# Patient Record
Sex: Male | Born: 1945 | Hispanic: Yes | Marital: Married | Smoking: Former smoker
Health system: Southern US, Community
[De-identification: ages and names within clinical notes are randomized; demographics above are authoritative.]

## PROBLEM LIST (undated history)

## (undated) DIAGNOSIS — I10 Essential (primary) hypertension: Secondary | ICD-10-CM

## (undated) DIAGNOSIS — D649 Anemia, unspecified: Secondary | ICD-10-CM

## (undated) DIAGNOSIS — N189 Chronic kidney disease, unspecified: Secondary | ICD-10-CM

## (undated) DIAGNOSIS — I509 Heart failure, unspecified: Secondary | ICD-10-CM

## (undated) DIAGNOSIS — Z87442 Personal history of urinary calculi: Secondary | ICD-10-CM

## (undated) DIAGNOSIS — N209 Urinary calculus, unspecified: Secondary | ICD-10-CM

## (undated) DIAGNOSIS — C9110 Chronic lymphocytic leukemia of B-cell type not having achieved remission: Secondary | ICD-10-CM

## (undated) DIAGNOSIS — R0600 Dyspnea, unspecified: Secondary | ICD-10-CM

## (undated) HISTORY — PX: UMBILICAL HERNIA REPAIR: SHX196

## (undated) HISTORY — PX: KNEE ARTHROSCOPY: SHX127

## (undated) HISTORY — DX: Urinary calculus, unspecified: N20.9

## (undated) HISTORY — PX: INGUINAL HERNIA REPAIR: SUR1180

---

## 2003-11-06 ENCOUNTER — Encounter: Admission: RE | Admit: 2003-11-06 | Discharge: 2003-11-06 | Payer: Self-pay | Admitting: Sports Medicine

## 2003-12-04 ENCOUNTER — Ambulatory Visit (HOSPITAL_COMMUNITY): Admission: RE | Admit: 2003-12-04 | Discharge: 2003-12-04 | Payer: Self-pay | Admitting: Family Medicine

## 2003-12-04 ENCOUNTER — Encounter: Admission: RE | Admit: 2003-12-04 | Discharge: 2003-12-04 | Payer: Self-pay | Admitting: Family Medicine

## 2003-12-21 ENCOUNTER — Ambulatory Visit (HOSPITAL_COMMUNITY): Admission: RE | Admit: 2003-12-21 | Discharge: 2003-12-21 | Payer: Self-pay | Admitting: Family Medicine

## 2004-01-01 ENCOUNTER — Encounter: Admission: RE | Admit: 2004-01-01 | Discharge: 2004-01-01 | Payer: Self-pay | Admitting: Sports Medicine

## 2004-01-30 ENCOUNTER — Ambulatory Visit (HOSPITAL_COMMUNITY): Admission: RE | Admit: 2004-01-30 | Discharge: 2004-01-31 | Payer: Self-pay | Admitting: *Deleted

## 2004-02-05 HISTORY — PX: CORONARY ANGIOPLASTY WITH STENT PLACEMENT: SHX49

## 2004-03-04 ENCOUNTER — Encounter: Admission: RE | Admit: 2004-03-04 | Discharge: 2004-03-04 | Payer: Self-pay | Admitting: Sports Medicine

## 2004-03-25 ENCOUNTER — Encounter: Admission: RE | Admit: 2004-03-25 | Discharge: 2004-03-25 | Payer: Self-pay | Admitting: Sports Medicine

## 2004-04-26 ENCOUNTER — Ambulatory Visit: Payer: Self-pay | Admitting: Family Medicine

## 2004-05-20 ENCOUNTER — Ambulatory Visit: Payer: Self-pay | Admitting: Family Medicine

## 2004-06-03 ENCOUNTER — Ambulatory Visit: Payer: Self-pay | Admitting: Family Medicine

## 2004-07-08 ENCOUNTER — Ambulatory Visit: Payer: Self-pay | Admitting: Family Medicine

## 2004-08-31 ENCOUNTER — Emergency Department (HOSPITAL_COMMUNITY): Admission: EM | Admit: 2004-08-31 | Discharge: 2004-09-01 | Payer: Self-pay | Admitting: Emergency Medicine

## 2004-09-16 ENCOUNTER — Ambulatory Visit: Payer: Self-pay | Admitting: Family Medicine

## 2004-10-28 ENCOUNTER — Ambulatory Visit: Payer: Self-pay | Admitting: Family Medicine

## 2004-11-18 ENCOUNTER — Ambulatory Visit: Payer: Self-pay | Admitting: Family Medicine

## 2004-11-25 ENCOUNTER — Ambulatory Visit: Payer: Self-pay | Admitting: Family Medicine

## 2004-12-16 ENCOUNTER — Ambulatory Visit: Payer: Self-pay | Admitting: Family Medicine

## 2005-03-03 ENCOUNTER — Ambulatory Visit: Payer: Self-pay | Admitting: Family Medicine

## 2005-03-10 ENCOUNTER — Ambulatory Visit: Payer: Self-pay | Admitting: Family Medicine

## 2005-05-05 ENCOUNTER — Ambulatory Visit: Payer: Self-pay | Admitting: Family Medicine

## 2005-07-07 ENCOUNTER — Ambulatory Visit: Payer: Self-pay | Admitting: Family Medicine

## 2005-07-10 ENCOUNTER — Ambulatory Visit: Payer: Self-pay | Admitting: Cardiology

## 2005-07-15 ENCOUNTER — Ambulatory Visit: Payer: Self-pay

## 2005-10-27 ENCOUNTER — Ambulatory Visit: Payer: Self-pay | Admitting: Family Medicine

## 2005-11-03 ENCOUNTER — Ambulatory Visit: Payer: Self-pay | Admitting: Family Medicine

## 2005-11-18 ENCOUNTER — Ambulatory Visit: Payer: Self-pay | Admitting: Cardiology

## 2005-12-01 ENCOUNTER — Ambulatory Visit: Payer: Self-pay | Admitting: Family Medicine

## 2005-12-18 ENCOUNTER — Ambulatory Visit: Payer: Self-pay | Admitting: Internal Medicine

## 2005-12-29 ENCOUNTER — Emergency Department (HOSPITAL_COMMUNITY): Admission: EM | Admit: 2005-12-29 | Discharge: 2005-12-29 | Payer: Self-pay | Admitting: Family Medicine

## 2006-01-05 ENCOUNTER — Ambulatory Visit: Payer: Self-pay | Admitting: Family Medicine

## 2006-03-09 ENCOUNTER — Ambulatory Visit: Payer: Self-pay | Admitting: Family Medicine

## 2006-03-23 ENCOUNTER — Ambulatory Visit: Payer: Self-pay | Admitting: Family Medicine

## 2006-04-06 ENCOUNTER — Ambulatory Visit: Payer: Self-pay | Admitting: Family Medicine

## 2006-05-04 ENCOUNTER — Ambulatory Visit: Payer: Self-pay | Admitting: Family Medicine

## 2006-06-03 ENCOUNTER — Ambulatory Visit: Payer: Self-pay | Admitting: Cardiology

## 2006-06-08 ENCOUNTER — Ambulatory Visit: Payer: Self-pay | Admitting: Family Medicine

## 2006-07-13 ENCOUNTER — Ambulatory Visit: Payer: Self-pay | Admitting: Family Medicine

## 2006-08-05 ENCOUNTER — Emergency Department (HOSPITAL_COMMUNITY): Admission: EM | Admit: 2006-08-05 | Discharge: 2006-08-05 | Payer: Self-pay | Admitting: Emergency Medicine

## 2006-08-10 ENCOUNTER — Ambulatory Visit: Payer: Self-pay | Admitting: Family Medicine

## 2006-08-31 ENCOUNTER — Ambulatory Visit: Payer: Self-pay | Admitting: Cardiology

## 2006-09-08 ENCOUNTER — Ambulatory Visit: Payer: Self-pay | Admitting: Cardiology

## 2006-10-22 DIAGNOSIS — IMO0002 Reserved for concepts with insufficient information to code with codable children: Secondary | ICD-10-CM

## 2006-10-22 DIAGNOSIS — I2510 Atherosclerotic heart disease of native coronary artery without angina pectoris: Secondary | ICD-10-CM | POA: Insufficient documentation

## 2006-10-22 DIAGNOSIS — E785 Hyperlipidemia, unspecified: Secondary | ICD-10-CM

## 2006-10-22 DIAGNOSIS — I10 Essential (primary) hypertension: Secondary | ICD-10-CM

## 2006-10-22 DIAGNOSIS — M1990 Unspecified osteoarthritis, unspecified site: Secondary | ICD-10-CM

## 2006-10-22 DIAGNOSIS — F209 Schizophrenia, unspecified: Secondary | ICD-10-CM | POA: Insufficient documentation

## 2006-10-22 DIAGNOSIS — H9190 Unspecified hearing loss, unspecified ear: Secondary | ICD-10-CM | POA: Insufficient documentation

## 2006-10-22 DIAGNOSIS — F339 Major depressive disorder, recurrent, unspecified: Secondary | ICD-10-CM | POA: Insufficient documentation

## 2006-11-16 ENCOUNTER — Encounter: Payer: Self-pay | Admitting: Family Medicine

## 2006-11-16 ENCOUNTER — Ambulatory Visit: Payer: Self-pay | Admitting: Family Medicine

## 2006-11-16 DIAGNOSIS — N529 Male erectile dysfunction, unspecified: Secondary | ICD-10-CM

## 2006-12-14 ENCOUNTER — Ambulatory Visit: Payer: Self-pay | Admitting: Family Medicine

## 2006-12-18 ENCOUNTER — Encounter: Payer: Self-pay | Admitting: Family Medicine

## 2007-01-20 ENCOUNTER — Encounter: Payer: Self-pay | Admitting: Family Medicine

## 2007-01-20 ENCOUNTER — Ambulatory Visit: Payer: Self-pay | Admitting: Sports Medicine

## 2007-01-20 LAB — CONVERTED CEMR LAB
AST: 40 units/L — ABNORMAL HIGH (ref 0–37)
Albumin: 4.7 g/dL (ref 3.5–5.2)
Alkaline Phosphatase: 99 units/L (ref 39–117)
BUN: 19 mg/dL (ref 6–23)
CO2: 26 meq/L (ref 19–32)
Chloride: 104 meq/L (ref 96–112)
Glucose, Bld: 107 mg/dL — ABNORMAL HIGH (ref 70–99)
HDL: 29 mg/dL — ABNORMAL LOW (ref >39)
LDL Cholesterol: 82 mg/dL (ref 0–99)
PSA: 0.37 ng/mL (ref 0.10–4.00)
Potassium: 3.8 meq/L (ref 3.5–5.3)
Total Bilirubin: 0.7 mg/dL (ref 0.3–1.2)
Total Protein: 7 g/dL (ref 6.0–8.3)
VLDL: 69 mg/dL — ABNORMAL HIGH (ref 0–40)

## 2007-01-25 ENCOUNTER — Ambulatory Visit: Payer: Self-pay | Admitting: Family Medicine

## 2007-01-25 LAB — CONVERTED CEMR LAB: Total CK: 96 units/L (ref 7–232)

## 2007-02-02 ENCOUNTER — Telehealth: Payer: Self-pay | Admitting: Family Medicine

## 2007-03-03 ENCOUNTER — Ambulatory Visit: Payer: Self-pay | Admitting: Cardiology

## 2007-03-04 ENCOUNTER — Ambulatory Visit: Payer: Self-pay | Admitting: Cardiology

## 2007-03-04 LAB — CONVERTED CEMR LAB
ALT: 26 units/L (ref 0–53)
Albumin: 3.8 g/dL (ref 3.5–5.2)
Alkaline Phosphatase: 97 units/L (ref 39–117)
Total CHOL/HDL Ratio: 4.8
Total Protein: 6.4 g/dL (ref 6.0–8.3)

## 2007-03-22 ENCOUNTER — Encounter: Payer: Self-pay | Admitting: Family Medicine

## 2007-03-22 ENCOUNTER — Ambulatory Visit: Payer: Self-pay | Admitting: Family Medicine

## 2007-03-24 LAB — CONVERTED CEMR LAB
MCV: 87.9 fL (ref 78.0–100.0)
Platelets: 177 10*3/uL (ref 150–400)
Prothrombin Time: 13.6 s (ref 11.6–15.2)
WBC: 14.2 10*3/uL — ABNORMAL HIGH (ref 4.0–10.5)

## 2007-05-17 ENCOUNTER — Ambulatory Visit: Payer: Self-pay | Admitting: Family Medicine

## 2007-07-05 ENCOUNTER — Ambulatory Visit: Payer: Self-pay | Admitting: Family Medicine

## 2007-07-16 ENCOUNTER — Ambulatory Visit: Payer: Self-pay | Admitting: Family Medicine

## 2007-07-19 ENCOUNTER — Ambulatory Visit: Payer: Self-pay | Admitting: Family Medicine

## 2007-07-19 LAB — CONVERTED CEMR LAB
Blood Glucose, Fingerstick: 109
Hemoglobin: 15.2 g/dL (ref 13.0–17.0)
Platelets: 180 10*3/uL (ref 150–400)
Potassium: 4.4 meq/L (ref 3.5–5.3)
RBC: 5.31 M/uL (ref 4.22–5.81)
Sodium: 144 meq/L (ref 135–145)
TSH: 1.326 microintl units/mL (ref 0.350–5.50)
WBC: 17.8 10*3/uL — ABNORMAL HIGH (ref 4.0–10.5)

## 2007-07-20 ENCOUNTER — Encounter: Payer: Self-pay | Admitting: Family Medicine

## 2007-07-21 LAB — CONVERTED CEMR LAB
Lymphocytes Relative: 73 % — ABNORMAL HIGH (ref 12–46)
Lymphs Abs: 16 10*3/uL — ABNORMAL HIGH (ref 0.7–4.0)
MCV: 87.2 fL (ref 78.0–100.0)
Monocytes Relative: 3 % (ref 3–12)
Neutro Abs: 4 10*3/uL (ref 1.7–7.7)
Neutrophils Relative %: 18 % — ABNORMAL LOW (ref 43–77)
RBC: 5.53 M/uL (ref 4.22–5.81)
WBC: 21.8 10*3/uL — ABNORMAL HIGH (ref 4.0–10.5)

## 2007-07-26 ENCOUNTER — Ambulatory Visit: Payer: Self-pay | Admitting: Family Medicine

## 2007-07-26 DIAGNOSIS — C9110 Chronic lymphocytic leukemia of B-cell type not having achieved remission: Secondary | ICD-10-CM

## 2007-07-28 LAB — CONVERTED CEMR LAB
Basophils Absolute: 0 10*3/uL (ref 0.0–0.1)
Basophils Relative: 0 % (ref 0–1)
Lymphocytes Relative: 71 % — ABNORMAL HIGH (ref 12–46)
MCHC: 33.9 g/dL (ref 30.0–36.0)
Neutro Abs: 3.2 10*3/uL (ref 1.7–7.7)
Neutrophils Relative %: 21 % — ABNORMAL LOW (ref 43–77)
Platelets: 193 10*3/uL (ref 150–400)
RBC: 5.36 M/uL (ref 4.22–5.81)
RDW: 13.8 % (ref 11.5–15.5)

## 2007-09-09 ENCOUNTER — Telehealth: Payer: Self-pay | Admitting: Family Medicine

## 2007-10-11 ENCOUNTER — Ambulatory Visit: Payer: Self-pay | Admitting: Cardiology

## 2007-10-11 ENCOUNTER — Ambulatory Visit: Payer: Self-pay | Admitting: Family Medicine

## 2007-10-11 LAB — CONVERTED CEMR LAB
ALT: 20 units/L (ref 0–53)
BUN: 17 mg/dL (ref 6–23)
CO2: 25 meq/L (ref 19–32)
Calcium: 10 mg/dL (ref 8.4–10.5)
Chloride: 106 meq/L (ref 96–112)
Creatinine, Ser: 1.2 mg/dL (ref 0.40–1.50)
Eosinophils Absolute: 1 10*3/uL — ABNORMAL HIGH (ref 0.0–0.7)
Eosinophils Relative: 5 % (ref 0–5)
Glucose, Bld: 128 mg/dL — ABNORMAL HIGH (ref 70–99)
HCT: 47.9 % (ref 39.0–52.0)
Hemoglobin: 16.5 g/dL (ref 13.0–17.0)
MCHC: 34.4 g/dL (ref 30.0–36.0)
MCV: 84.3 fL (ref 78.0–100.0)
Monocytes Absolute: 0.8 10*3/uL (ref 0.1–1.0)
Monocytes Relative: 4 % (ref 3–12)
Neutrophils Relative %: 17 % — ABNORMAL LOW (ref 43–77)
RBC: 5.68 M/uL (ref 4.22–5.81)

## 2007-10-12 ENCOUNTER — Encounter: Payer: Self-pay | Admitting: Family Medicine

## 2007-10-13 ENCOUNTER — Ambulatory Visit: Payer: Self-pay | Admitting: Hematology & Oncology

## 2007-10-28 ENCOUNTER — Ambulatory Visit: Payer: Self-pay | Admitting: Hematology & Oncology

## 2007-10-28 ENCOUNTER — Telehealth: Payer: Self-pay | Admitting: *Deleted

## 2007-11-08 ENCOUNTER — Encounter (INDEPENDENT_AMBULATORY_CARE_PROVIDER_SITE_OTHER): Payer: Self-pay | Admitting: *Deleted

## 2007-12-03 ENCOUNTER — Encounter: Payer: Self-pay | Admitting: Hematology & Oncology

## 2007-12-03 ENCOUNTER — Encounter: Payer: Self-pay | Admitting: Family Medicine

## 2007-12-03 ENCOUNTER — Other Ambulatory Visit: Admission: RE | Admit: 2007-12-03 | Discharge: 2007-12-03 | Payer: Self-pay | Admitting: Hematology & Oncology

## 2007-12-03 LAB — CBC WITH DIFFERENTIAL/PLATELET
BASO%: 0.7 % (ref 0.0–2.0)
Basophils Absolute: 0.2 10*3/uL — ABNORMAL HIGH (ref 0.0–0.1)
EOS%: 3.3 % (ref 0.0–7.0)
MCH: 29.5 pg (ref 28.0–33.4)
MCHC: 35.6 g/dL (ref 32.0–35.9)
MCV: 83.1 fL (ref 81.6–98.0)
MONO%: 3.3 % (ref 0.0–13.0)
RBC: 5.58 10*6/uL (ref 4.20–5.71)
RDW: 13.1 % (ref 11.2–14.6)
lymph#: 15.4 10*3/uL — ABNORMAL HIGH (ref 0.9–3.3)

## 2007-12-03 LAB — CHCC SMEAR

## 2007-12-03 LAB — TECHNOLOGIST REVIEW

## 2007-12-07 LAB — SPEP & IFE WITH QIG
Albumin ELP: 63 % (ref 55.8–66.1)
Alpha-1-Globulin: 4 % (ref 2.9–4.9)
Alpha-2-Globulin: 7.4 % (ref 7.1–11.8)
Total Protein, Serum Electrophoresis: 7.7 g/dL (ref 6.0–8.3)

## 2007-12-23 LAB — FLOW CYTOMETRY

## 2008-01-03 ENCOUNTER — Ambulatory Visit: Payer: Self-pay | Admitting: Family Medicine

## 2008-01-31 ENCOUNTER — Encounter: Payer: Self-pay | Admitting: Family Medicine

## 2008-01-31 ENCOUNTER — Ambulatory Visit: Payer: Self-pay | Admitting: Family Medicine

## 2008-02-02 LAB — CONVERTED CEMR LAB: HDL: 29 mg/dL — ABNORMAL LOW (ref >39)

## 2008-02-07 ENCOUNTER — Ambulatory Visit: Payer: Self-pay | Admitting: Hematology & Oncology

## 2008-02-07 ENCOUNTER — Ambulatory Visit: Payer: Self-pay | Admitting: Family Medicine

## 2008-04-17 ENCOUNTER — Ambulatory Visit: Payer: Self-pay | Admitting: Family Medicine

## 2008-04-17 LAB — CONVERTED CEMR LAB
Basophils Relative: 0 % (ref 0–1)
Eosinophils Relative: 5 % (ref 0–5)
HCT: 49.3 % (ref 39.0–52.0)
Hemoglobin: 16.1 g/dL (ref 13.0–17.0)
Lymphocytes Relative: 75 % — ABNORMAL HIGH (ref 12–46)
Lymphs Abs: 15.9 10*3/uL — ABNORMAL HIGH (ref 0.7–4.0)
Monocytes Absolute: 0.4 10*3/uL (ref 0.1–1.0)
Neutro Abs: 3.8 10*3/uL (ref 1.7–7.7)
Platelets: 200 10*3/uL (ref 150–400)
WBC: 21.2 10*3/uL — ABNORMAL HIGH (ref 4.0–10.5)

## 2008-04-18 ENCOUNTER — Encounter: Payer: Self-pay | Admitting: Family Medicine

## 2008-04-20 ENCOUNTER — Encounter: Payer: Self-pay | Admitting: Family Medicine

## 2008-05-22 ENCOUNTER — Ambulatory Visit: Payer: Self-pay | Admitting: Family Medicine

## 2008-05-22 LAB — CONVERTED CEMR LAB

## 2008-05-30 ENCOUNTER — Encounter: Payer: Self-pay | Admitting: Family Medicine

## 2008-05-30 LAB — CONVERTED CEMR LAB
OCCULT 2: NEGATIVE
OCCULT 3: NEGATIVE

## 2008-05-31 ENCOUNTER — Encounter: Payer: Self-pay | Admitting: Family Medicine

## 2008-06-19 ENCOUNTER — Ambulatory Visit: Payer: Self-pay | Admitting: Family Medicine

## 2008-06-19 DIAGNOSIS — K089 Disorder of teeth and supporting structures, unspecified: Secondary | ICD-10-CM | POA: Insufficient documentation

## 2008-06-19 LAB — CONVERTED CEMR LAB
Bilirubin Urine: NEGATIVE
Ketones, urine, test strip: NEGATIVE
Specific Gravity, Urine: 1.015
Triglycerides: 208 mg/dL — ABNORMAL HIGH (ref <150)
pH: 7.5

## 2008-07-10 ENCOUNTER — Ambulatory Visit: Payer: Self-pay | Admitting: Family Medicine

## 2008-07-10 LAB — CONVERTED CEMR LAB
ALT: 16 units/L (ref 0–53)
AST: 15 units/L (ref 0–37)
Albumin: 4.5 g/dL (ref 3.5–5.2)
Alkaline Phosphatase: 110 units/L (ref 39–117)
BUN: 18 mg/dL (ref 6–23)
CO2: 22 meq/L (ref 19–32)
Calcium: 10.4 mg/dL (ref 8.4–10.5)
Chloride: 105 meq/L (ref 96–112)
Creatinine, Ser: 1.05 mg/dL (ref 0.40–1.50)
Glucose, Bld: 108 mg/dL — ABNORMAL HIGH (ref 70–99)
HCT: 47.6 % (ref 39.0–52.0)
Hemoglobin: 15.8 g/dL (ref 13.0–17.0)
MCHC: 33.2 g/dL (ref 30.0–36.0)
MCV: 85.9 fL (ref 78.0–100.0)
Platelets: 206 10*3/uL (ref 150–400)
Potassium: 4.3 meq/L (ref 3.5–5.3)
RBC: 5.54 M/uL (ref 4.22–5.81)
RDW: 14.1 % (ref 11.5–15.5)
Sodium: 141 meq/L (ref 135–145)
Total Bilirubin: 0.6 mg/dL (ref 0.3–1.2)
Total Protein: 6.6 g/dL (ref 6.0–8.3)
WBC: 18.8 10*3/uL — ABNORMAL HIGH (ref 4.0–10.5)

## 2008-08-07 ENCOUNTER — Encounter: Payer: Self-pay | Admitting: Family Medicine

## 2008-08-07 ENCOUNTER — Ambulatory Visit: Payer: Self-pay | Admitting: Family Medicine

## 2008-09-11 ENCOUNTER — Ambulatory Visit: Payer: Self-pay | Admitting: Family Medicine

## 2008-09-11 DIAGNOSIS — F1111 Opioid abuse, in remission: Secondary | ICD-10-CM | POA: Insufficient documentation

## 2008-10-09 ENCOUNTER — Ambulatory Visit: Payer: Self-pay | Admitting: Family Medicine

## 2008-10-09 LAB — CONVERTED CEMR LAB
Basophils Absolute: 0.1 10*3/uL (ref 0.0–0.1)
Calcium: 11.1 mg/dL — ABNORMAL HIGH (ref 8.4–10.5)
Creatinine, Ser: 1.39 mg/dL (ref 0.40–1.50)
Eosinophils Absolute: 1.1 10*3/uL — ABNORMAL HIGH (ref 0.0–0.7)
Eosinophils Relative: 4 % (ref 0–5)
MCHC: 35.5 g/dL (ref 30.0–36.0)
Neutro Abs: 5.9 10*3/uL (ref 1.7–7.7)
Neutrophils Relative %: 22 % — ABNORMAL LOW (ref 43–77)
RDW: 13.5 % (ref 11.5–15.5)

## 2008-10-10 ENCOUNTER — Encounter: Payer: Self-pay | Admitting: Family Medicine

## 2008-10-11 ENCOUNTER — Encounter: Payer: Self-pay | Admitting: Family Medicine

## 2009-01-01 ENCOUNTER — Encounter: Payer: Self-pay | Admitting: Family Medicine

## 2009-01-01 ENCOUNTER — Ambulatory Visit: Payer: Self-pay | Admitting: Family Medicine

## 2009-01-01 LAB — CONVERTED CEMR LAB
ALT: 20 units/L (ref 0–53)
Albumin: 4.3 g/dL (ref 3.5–5.2)
Alkaline Phosphatase: 126 units/L — ABNORMAL HIGH (ref 39–117)
CO2: 24 meq/L (ref 19–32)
Glucose, Bld: 72 mg/dL (ref 70–99)
HCT: 43.8 % (ref 39.0–52.0)
Lymphocytes Relative: 75 % — ABNORMAL HIGH (ref 12–46)
Lymphs Abs: 14.9 10*3/uL — ABNORMAL HIGH (ref 0.7–4.0)
MCV: 83.7 fL (ref 78.0–100.0)
Monocytes Absolute: 0.6 10*3/uL (ref 0.1–1.0)
Monocytes Relative: 3 % (ref 3–12)
Neutro Abs: 3.1 10*3/uL (ref 1.7–7.7)
Potassium: 3.9 meq/L (ref 3.5–5.3)
Sodium: 142 meq/L (ref 135–145)
Total Bilirubin: 0.6 mg/dL (ref 0.3–1.2)
Total Protein: 6.7 g/dL (ref 6.0–8.3)

## 2009-01-08 ENCOUNTER — Encounter: Payer: Self-pay | Admitting: Family Medicine

## 2009-01-15 ENCOUNTER — Ambulatory Visit: Payer: Self-pay | Admitting: Family Medicine

## 2009-01-17 ENCOUNTER — Telehealth: Payer: Self-pay | Admitting: Family Medicine

## 2009-03-26 ENCOUNTER — Ambulatory Visit: Payer: Self-pay | Admitting: Family Medicine

## 2009-03-27 ENCOUNTER — Encounter: Payer: Self-pay | Admitting: Family Medicine

## 2009-03-27 ENCOUNTER — Ambulatory Visit: Payer: Self-pay | Admitting: Family Medicine

## 2009-03-27 LAB — CONVERTED CEMR LAB
ALT: 10 units/L (ref 0–53)
Alkaline Phosphatase: 89 units/L (ref 39–117)
Basophils Relative: 0 % (ref 0–1)
CO2: 23 meq/L (ref 19–32)
Chloride: 106 meq/L (ref 96–112)
Creatinine, Ser: 1.4 mg/dL (ref 0.40–1.50)
Glucose, Bld: 99 mg/dL (ref 70–99)
Hemoglobin: 15.2 g/dL (ref 13.0–17.0)
Lymphs Abs: 18.9 10*3/uL — ABNORMAL HIGH (ref 0.7–4.0)
MCHC: 33 g/dL (ref 30.0–36.0)
Monocytes Absolute: 0.3 10*3/uL (ref 0.1–1.0)
Monocytes Relative: 1 % — ABNORMAL LOW (ref 3–12)
Neutro Abs: 3.3 10*3/uL (ref 1.7–7.7)
Potassium: 3.6 meq/L (ref 3.5–5.3)
RBC: 5.36 M/uL (ref 4.22–5.81)
Sodium: 140 meq/L (ref 135–145)
Total Bilirubin: 0.5 mg/dL (ref 0.3–1.2)
Total CHOL/HDL Ratio: 12.3
Total Protein: 6.3 g/dL (ref 6.0–8.3)

## 2009-03-28 ENCOUNTER — Encounter: Payer: Self-pay | Admitting: Family Medicine

## 2009-04-05 ENCOUNTER — Encounter: Payer: Self-pay | Admitting: Family Medicine

## 2009-04-05 ENCOUNTER — Telehealth: Payer: Self-pay | Admitting: Family Medicine

## 2009-04-19 ENCOUNTER — Telehealth: Payer: Self-pay | Admitting: Cardiovascular Disease

## 2009-05-14 ENCOUNTER — Ambulatory Visit: Payer: Self-pay | Admitting: Family Medicine

## 2009-05-17 ENCOUNTER — Encounter: Payer: Self-pay | Admitting: Family Medicine

## 2009-05-17 ENCOUNTER — Ambulatory Visit: Payer: Self-pay | Admitting: Family Medicine

## 2009-05-17 LAB — CONVERTED CEMR LAB
HDL: 28 mg/dL — ABNORMAL LOW (ref >39)
Triglycerides: 562 mg/dL — ABNORMAL HIGH (ref <150)

## 2009-05-18 ENCOUNTER — Telehealth (INDEPENDENT_AMBULATORY_CARE_PROVIDER_SITE_OTHER): Payer: Self-pay | Admitting: *Deleted

## 2009-06-12 ENCOUNTER — Ambulatory Visit: Payer: Self-pay | Admitting: Cardiovascular Disease

## 2009-06-14 ENCOUNTER — Telehealth (INDEPENDENT_AMBULATORY_CARE_PROVIDER_SITE_OTHER): Payer: Self-pay | Admitting: *Deleted

## 2009-06-18 ENCOUNTER — Ambulatory Visit: Payer: Self-pay

## 2009-06-18 ENCOUNTER — Encounter (HOSPITAL_COMMUNITY): Admission: RE | Admit: 2009-06-18 | Discharge: 2009-08-22 | Payer: Self-pay | Admitting: Cardiovascular Disease

## 2009-06-18 ENCOUNTER — Ambulatory Visit: Payer: Self-pay | Admitting: Cardiology

## 2009-09-05 ENCOUNTER — Ambulatory Visit: Payer: Self-pay | Admitting: Family Medicine

## 2009-09-05 ENCOUNTER — Encounter: Payer: Self-pay | Admitting: Family Medicine

## 2009-09-05 LAB — CONVERTED CEMR LAB
BUN: 16 mg/dL (ref 6–23)
CO2: 27 meq/L (ref 19–32)
Calcium: 10.9 mg/dL — ABNORMAL HIGH (ref 8.4–10.5)
Chloride: 104 meq/L (ref 96–112)
Creatinine, Ser: 1.29 mg/dL (ref 0.40–1.50)
Eosinophils Absolute: 1.4 10*3/uL — ABNORMAL HIGH (ref 0.0–0.7)
Eosinophils Relative: 6 % — ABNORMAL HIGH (ref 0–5)
Glucose, Bld: 93 mg/dL (ref 70–99)
HCT: 45.4 % (ref 39.0–52.0)
Hemoglobin: 16.2 g/dL (ref 13.0–17.0)
Lymphs Abs: 18 10*3/uL — ABNORMAL HIGH (ref 0.7–4.0)
MCV: 83.9 fL (ref 78.0–100.0)
Monocytes Absolute: 0.5 10*3/uL (ref 0.1–1.0)
Monocytes Relative: 2 % — ABNORMAL LOW (ref 3–12)
RDW: 13.7 % (ref 11.5–15.5)
Triglycerides: 437 mg/dL — ABNORMAL HIGH (ref <150)

## 2009-09-26 ENCOUNTER — Ambulatory Visit: Payer: Self-pay | Admitting: Family Medicine

## 2009-10-23 ENCOUNTER — Ambulatory Visit: Payer: Self-pay | Admitting: Family Medicine

## 2009-10-23 ENCOUNTER — Encounter: Payer: Self-pay | Admitting: Family Medicine

## 2009-10-23 LAB — CONVERTED CEMR LAB
ALT: 29 units/L (ref 0–53)
AST: 68 units/L — ABNORMAL HIGH (ref 0–37)
Albumin: 4.3 g/dL (ref 3.5–5.2)
Alkaline Phosphatase: 94 units/L (ref 39–117)
BUN: 20 mg/dL (ref 6–23)
Creatinine, Ser: 1.67 mg/dL — ABNORMAL HIGH (ref 0.40–1.50)
Potassium: 3.6 meq/L (ref 3.5–5.3)

## 2009-11-01 ENCOUNTER — Telehealth: Payer: Self-pay | Admitting: Family Medicine

## 2009-11-01 ENCOUNTER — Encounter: Payer: Self-pay | Admitting: Family Medicine

## 2010-02-08 ENCOUNTER — Ambulatory Visit: Payer: Self-pay | Admitting: Family Medicine

## 2010-02-08 DIAGNOSIS — M6749 Ganglion, multiple sites: Secondary | ICD-10-CM | POA: Insufficient documentation

## 2010-02-08 LAB — CONVERTED CEMR LAB
AST: 28 units/L (ref 0–37)
Albumin: 4.5 g/dL (ref 3.5–5.2)
Alkaline Phosphatase: 67 units/L (ref 39–117)
Chloride: 105 meq/L (ref 96–112)
Glucose, Bld: 107 mg/dL — ABNORMAL HIGH (ref 70–99)
Potassium: 3.6 meq/L (ref 3.5–5.3)
Sodium: 141 meq/L (ref 135–145)
Total Protein: 6.9 g/dL (ref 6.0–8.3)

## 2010-02-11 ENCOUNTER — Telehealth: Payer: Self-pay | Admitting: Family Medicine

## 2010-02-11 DIAGNOSIS — N184 Chronic kidney disease, stage 4 (severe): Secondary | ICD-10-CM

## 2010-06-03 ENCOUNTER — Ambulatory Visit: Payer: Self-pay | Admitting: Family Medicine

## 2010-06-03 ENCOUNTER — Encounter: Payer: Self-pay | Admitting: Family Medicine

## 2010-06-03 DIAGNOSIS — L299 Pruritus, unspecified: Secondary | ICD-10-CM | POA: Insufficient documentation

## 2010-06-04 ENCOUNTER — Encounter: Payer: Self-pay | Admitting: Family Medicine

## 2010-06-14 LAB — CONVERTED CEMR LAB
BUN: 20 mg/dL (ref 6–23)
CO2: 26 meq/L (ref 19–32)
Chloride: 106 meq/L (ref 96–112)
Creatinine, Ser: 1.44 mg/dL (ref 0.40–1.50)
Glucose, Bld: 98 mg/dL (ref 70–99)
HCT: 44 % (ref 39.0–52.0)
Hemoglobin: 15.4 g/dL (ref 13.0–17.0)
MCV: 84.3 fL (ref 78.0–100.0)
Potassium: 4.5 meq/L (ref 3.5–5.3)
RBC: 5.22 M/uL (ref 4.22–5.81)
WBC: 21.9 10*3/uL — ABNORMAL HIGH (ref 4.0–10.5)

## 2010-07-01 ENCOUNTER — Ambulatory Visit: Payer: Self-pay | Admitting: Family Medicine

## 2010-07-01 LAB — CONVERTED CEMR LAB
BUN: 17 mg/dL (ref 6–23)
CO2: 26 meq/L (ref 19–32)
Glucose, Bld: 113 mg/dL — ABNORMAL HIGH (ref 70–99)
Potassium: 3.9 meq/L (ref 3.5–5.3)

## 2010-07-15 ENCOUNTER — Ambulatory Visit: Payer: Self-pay | Admitting: Family Medicine

## 2010-08-12 ENCOUNTER — Ambulatory Visit: Payer: Self-pay | Admitting: Family Medicine

## 2010-08-27 ENCOUNTER — Ambulatory Visit: Admit: 2010-08-27 | Payer: Self-pay | Admitting: Family Medicine

## 2010-09-24 NOTE — Progress Notes (Signed)
      New Problems: CHRONIC KIDNEY DISEASE UNSPECIFIED (ICD-585.9)   New Problems: CHRONIC KIDNEY DISEASE UNSPECIFIED (ICD-585.9) Call placed to [facility] to discuss lab results. Answering message left in Spanish.  I wish to inform him that his kidney function is relatively unchanged, and I would like to have him come to give a urine specimen for UA as part of the workup.    Future orders entered.  Dalbert Mayotte MD  February 11, 2010 2:55 PM

## 2010-09-24 NOTE — Letter (Signed)
Summary: Results Follow-up Letter  [facility]  [address]   [place], [place] [address]   Phone: [phone]  Fax: [phone]    06/04/2010  [address] [place], [place]  [address]  Dear [name],   Patient: Nathan Richards Note: All result statuses are Final unless otherwise noted.  Tests: (1) CBC NO Diff (Complete Blood Count) (10000)   WBC                  [H]  21.9 K/uL                   4.0-10.5   RBC                       5.22 MIL/uL                 4.22-5.81   Hemoglobin                15.4 g/dL                   13.0-17.0   Hematocrit                44.0 %                      39.0-52.0   MCV                       84.3 fL                     78.0-100.0 ! MCH                       29.5 pg                     26.0-34.0   MCHC                      35.0 g/dL                   30.0-36.0   RDW                       13.8 %                      11.5-15.5   Platelet Count            180 K/uL                    150-400 La cuenta de su sangre queda lo mismo. No padece de anemia. Tests: (2) Sed Rate (ESR) (15010)   Sed Rate (ESR)            3 mm/hr                     0-16 Inflamacion en su cuerpo esta muy baja. Acetaminophen debe funcionar bien para su artritis. Tests: (3) Comprehensive Metabolic Panel (09735)   Sodium                    140 mEq/L                   135-145   Potassium                 4.5  mEq/L                   3.5-5.3   Chloride                  106 mEq/L                   96-112   CO2                       26 mEq/L                    19-32   Glucose                   98 mg/dL                    70-99   BUN                       20 mg/dL                    6-23   Creatinine                1.44 mg/dL                  0.40-1.50   Bilirubin, Total          0.6 mg/dL                   0.3-1.2   Alkaline Phosphatase      95 U/L                      39-117   AST/SGOT                  23 U/L                      0-37   ALT/SGPT                  16  U/L                      0-53   Total Protein             7.0 g/dL                    6.0-8.3   Albumin                   4.4 g/dL                    3.5-5.2   Calcium              [H]  11.1 mg/dL                  8.4-10.5 El calcio [employer]. Vamos a repetirla la visita que viene. Document Creation Date: 06/04/2010 7:45 AM _______________________________________________________________________ Sincerely,  Candelaria Celeste MD [facility]           Appended Document: Results Follow-up Letter MAILED.

## 2010-09-24 NOTE — Assessment & Plan Note (Signed)
Summary: F/U  [facility] [place]   Vital Signs:  Patient profile:   65 year old male Height:      63 inches Weight:      143 pounds BMI:     25.42 Temp:     98.1 degrees F oral Pulse rate:   76 / minute BP sitting:   162 / 82  (left arm) Cuff size:   regular  Vitals Entered By: Schuyler Amor CMA (July 01, 2010 2:11 PM)  Serial Vital Signs/Assessments:  Time      Position  BP       Pulse  Resp  Temp     By                     158/82                         Vic Blackbird MD  CC: F/U labs, itching, blood pressure Pain Assessment Patient in pain? no        Primary Care Provider:  Candelaria Celeste MD  CC:  F/U labs, itching, and blood pressure.  History of Present Illness: Pt seen by Vic Blackbird MD, PGY-3, Attending Dr. Walker Kehr, Spainish Interpreter    Itching- taking new medication for itching for 4 weeks, had two bad episodes of pruritis, taking zyrtec daily which has helped a little bit, no rash, itching all over, has not tried any OTC meds no change in clothing, no change in detergent or soaps , no new medications, no other sick contacts  CLL- reviewed labs, no weight up 3lbs   Arthritis- Tried voltaren gel but this did not help is knees,used voltaren two times a day a 5 days, taking tramadol now up to QID   No history of steroid injections, pain in hips, ankles and knees  No recent falls, Not taking tylenol arthritis    HTN-- Taking Diltiazem daily , no longer takes Hyzaar, no chest pain, occ headache, does not take his BP at home  Sleep is fair, occ nocturia 3-4 times a night, others none, occ urine stream starts and stops, no dribbling  No difficulty with impotence   Habits & Providers  Alcohol-Tobacco-Diet     Tobacco Status: quit  Current Medications (verified): 1)  Nitroquick 0.4 Mg Subl (Nitroglycerin) .... Place 1 Tablet Under Tongue As Directed 2)  Seroquel 200 Mg  Tabs (Quetiapine Fumarate) .Marland Kitchen.. 1 Tab Qhs 3)  Ecotrin 325 Mg  Tbec (Aspirin) .... Take 1 Tab Daily 4)   Fish Oil Concentrate 1000 Mg  Caps (Omega-3 Fatty Acids) .... One Tab By Mouth Four  Times A Day With Meals (Okay To Use Otc of Similar Dose Ie 1200) 5)  Fluoxetine Hcl 40 Mg Caps (Fluoxetine Hcl) .... Take One Tablet Daily At Bedtime 6)  Tylenol Ex St Arthritis Pain 500 Mg  Tabs (Acetaminophen) .... 2 Tabs By Mouth Tid 7)  Cardizem Cd 240 Mg Xr24h-Cap (Diltiazem Hcl Coated Beads) .... Take One Tab Two Times A Day 8)  Nexium 40 Mg Cpdr (Esomeprazole Magnesium) .... Sig: Take 1 By Mouth Daily 9)  Tricor 145 Mg Tabs (Fenofibrate) .... Sig: Take 1 Tab By Mouth 1 Time Daily Instructions in Spanish 10)  Simvastatin 40 Mg Tabs (Simvastatin) .Marland Kitchen.. 1 By Mouth One Time Daily Spanish Language Instructions 11)  Voltaren 1 % Gel (Diclofenac Sodium) .... Apply Small Amount To Affected Joints Twice A Day As Needed For Pain Dispo: Large Tube 12)  Zyrtec Allergy 10 Mg Tbdp (Cetirizine Hcl) .Marland Kitchen.. 1 Tab By Mouth Daily For Itching  Allergies (verified): 1)  ! Ace Inhibitors 2)  ! Beta Blockers 3)  ! Norvasc  Past History:  Past Surgical History: Bilateral inguinal hernia repair  Umbilical hernia repair  urolithiasis cystoscopy - 08/25/2000  cath Taxus stents of LAD, balloon of 1st diagonal - 02/05/2004 Left heart catheterization with coronary angiography and left ventriculography. Percutaneous transluminal coronary angioplasty with placement of a drug eluting stent in the proximal-to-mid left anterior descending artery. Percutaneous transluminal coronary angioplasty of a first diagonal branch.HEMODYNAMICS:  Left ventricular pressure 154/10, aortic pressure 152/82.There was no aortic valve gradient. Successful bifurcational percutaneous coronary intervention of the proximal left anterior descending artery and the first diagonal branch.  A 50% followed by a 95% followed by a 70% stenosis in the left anterior descending artery were reduced to 0% residual with TIMI-3 flow.  A 95%stenosis in the ostium of the first  diagonal branch was reduced to 25% residual with TIMI-3 flow.  Junious Silk, M.D. [facility]  D:  01/30/2004  T:  02/01/2004  Job:  [id number]  cc:   Barth Kirks C. Alford Highland, M.D. [facility]. Med - Resident - [facility],   Arthroscopy in Right knee- [place]   Physical Exam  General:  vitals reviewed and rechecked.  well appearing, no apparent distress Vital signs noted  Eyes:  non icteric Mouth:  MMM Neck:  no lymphadenopathy Heart:  RRR, no murmur Extremities:  Bilat feet- no gross lesions, 1.5cm cystic mass on right medial foot, moves with dorsiflexion Skin:  no jaundice, no rash or abnormal lesions present' no excoriations    Impression & Recommendations:  Problem # 1:  HYPERTENSION, BENIGN SYSTEMIC (ICD-401.1) Assessment Unchanged  Pt with multiple medication side effects, plan to recheck BMET, likely increase Cardizem if calicum still elevated, if calcium at baseline then restart HCTZ  The following medications were removed from the medication list:    Hyzaar 100-12.5 Mg Tabs (Losartan potassium-hctz) .Marland Kitchen... Take 1 tablet by mouth once a day His updated medication list for this problem includes:    Cardizem Cd 240 Mg Xr24h-cap (Diltiazem hcl coated beads) .Marland Kitchen... Take one tab two times a day  Orders: [place]- Est  Level 4 (27062)  Problem # 2:  HYPERCALCEMIA (ICD-275.42) Assessment: New Recheck labs today, no meds currently contributing, no exogenous intake of calcium, i do not feel levels are high enough to cause any systemic symptoms at this time Orders: Basic Met-[facility] (37628-31517) [place]- Est  Level 4 (61607)  Problem # 3:  PRURITUS (ICD-698.9) Assessment: Improved  Mild improvement with H1 blocker, as pt takes PPI as needed, will add H2 blocker and Sarna lotion Reviewed labs no liver etiology, no rash, no medications causing pruritis, possibility from CLL Treat symptoms  Orders: [place]- Est  Level 4 (99214)  Problem # 4:  OSTEOARTHRITIS, 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 Take 1 tab daily    Tylenol Ex St Arthritis Pain 500 Mg Tabs (Acetaminophen) .Marland Kitchen... 2 tabs by mouth tid  Orders: [place]- Est  Level 4 (37106)  Complete Medication List: 1)  Nitroquick 0.4 Mg Subl (Nitroglycerin) .... Place 1 tablet under tongue as directed 2)  Seroquel 200 Mg Tabs (Quetiapine fumarate) .Marland Kitchen.. 1 tab qhs 3)  Ecotrin 325 Mg Tbec (Aspirin) .... Take 1 tab daily 4)  Fish Oil Concentrate  1000 Mg Caps (Omega-3 fatty acids) .... One tab by mouth four  times a day with meals (okay to use otc of similar dose ie 1200) 5)  Fluoxetine Hcl 40 Mg Caps (Fluoxetine hcl) .... Take one tablet daily at bedtime 6)  Tylenol Ex St Arthritis Pain 500 Mg Tabs (Acetaminophen) .... 2 tabs by mouth tid 7)  Cardizem Cd 240 Mg Xr24h-cap (Diltiazem hcl coated beads) .... Take one tab two times a day 8)  Nexium 40 Mg Cpdr (Esomeprazole magnesium) .... Sig: take 1 by mouth daily 9)  Tricor 145 Mg Tabs (Fenofibrate) .... Sig: take 1 tab by mouth 1 time daily instructions in spanish 10)  Simvastatin 40 Mg Tabs (Simvastatin) .Marland Kitchen.. 1 by mouth one time daily spanish language instructions 11)  Voltaren 1 % Gel (Diclofenac sodium) .... Apply small amount to affected joints twice a day as needed for pain dispo: large tube 12)  Zyrtec Allergy 10 Mg Tbdp (Cetirizine hcl) .Marland Kitchen.. 1 tab by mouth daily for itching 13)  Sarna 0.5-0.5 % Lotn (Camphor-menthol) .... Apply to skin two times a day 14)  Ranitidine Hcl 150 Mg Tabs (Ranitidine hcl) .Marland Kitchen.. 1 by mouth daily for itching  Patient Instructions: 1)  We will call send a letter or call you about your labs 2)  For the itching moisturize with Sarna lotion 3)  Use the Ranitidine $RemoveBefore'150mg'PRvNyzroaTtUM$  daily for itching, continue to take the [place] 4)  For your blood pressure continue the current medication [place] (Diltiazem) 5)   Do not take any extra calcium  6)  Take the tylenol arthritis for your pain 7)  Try to take your blood pressure at the drug store and write down the results 8)  Return in 2 weeks for follow-up  Prescriptions: RANITIDINE HCL 150 MG TABS (RANITIDINE HCL) 1 by mouth daily for itching  #30 x 1   Entered and Authorized by:   Vic Blackbird MD   Signed by:   Vic Blackbird MD on 07/01/2010   Method used:   Electronically to        [facility]. [address] [phone]* (retail)       [address] [place], [place]  [address]       Ph: [phone] or [phone]       Fax: [phone]   RxID:   [id number] SARNA 0.5-0.5 % LOTN (CAMPHOR-MENTHOL) apply to skin two times a day  #1 x 1   Entered and Authorized by:   Vic Blackbird MD   Signed by:   Vic Blackbird MD on 07/01/2010   Method used:   Electronically to        [facility]. [address] [phone]* (retail)       [address] [place], [place]  [address]       Ph: [phone] or [phone]       Fax: [phone]   RxID:   [id number] TYLENOL EX ST ARTHRITIS PAIN 500 MG  TABS (ACETAMINOPHEN) 2 tabs by mouth TID  #180 x 11   Entered by:   Vic Blackbird MD   Authorized by:   Candelaria Celeste MD   Signed by:   Vic Blackbird MD on 07/01/2010   Method used:   Electronically to        [facility]. [address] [phone]* (retail)  [place], [place]  [address]       Ph: [phone] or [phone]       Fax: [phone]   RxID:   [id number] FISH OIL CONCENTRATE 1000 MG  CAPS (OMEGA-3 FATTY ACIDS) One tab by mouth four  times a day with meals (okay to use OTC of similar dose ie 1200)  #120 x 6   Entered by:   Vic Blackbird MD   Authorized by:   Candelaria Celeste MD   Signed by:   Vic Blackbird MD on 07/01/2010   Method used:   Electronically to        [facility]. [address] [phone]* (retail)       [address] [place], [place]   [address]       Ph: [phone] or [phone]       Fax: [phone]   RxID:   [id number] ECOTRIN 325 MG  TBEC (ASPIRIN) Take 1 tab daily  #30 x 6   Entered by:   Vic Blackbird MD   Authorized by:   Candelaria Celeste MD   Signed by:   Vic Blackbird MD on 07/01/2010   Method used:   Electronically to        [facility]. [address] [phone]* (retail)       [address]       [place], [place]  [address]       Ph: [phone] or [phone]       Fax: [phone]   RxID:   [id number]    Orders Added: 1)  Basic Met-[facility] [phone] 2)  [facility]- Est  Level 4 [11941]     Vic Blackbird MD  July 01, 2010 3:34 PM\   Prevention & Chronic Care Immunizations   Influenza vaccine: Fluvax Non-MCR  (06/03/2010)   Influenza vaccine due: 05/22/2009    Tetanus booster: 10/23/2005: Done.   Tetanus booster due: 10/24/2015    Pneumococcal vaccine: Pneumovax (Medicare)  (01/03/2008)   Pneumococcal vaccine due: None    H. zoster vaccine: 08/07/2008: given  Colorectal Screening   Hemoccult: normal  (05/30/2008)   Hemoccult action/deferral: Given x 3  (12/14/2006)   Hemoccult due: 05/30/2009    Colonoscopy: Not documented   Colonoscopy due: Refused  (05/22/2008)  Other Screening   PSA: 0.32  (01/01/2009)   PSA due due: 01/20/2008   Smoking status: quit  (07/01/2010)  Lipids   Total Cholesterol: 229  (05/17/2009)   LDL: See Comment mg/dL  (05/17/2009)   LDL Direct: 42  (10/23/2009)   HDL: 28  (05/17/2009)   Triglycerides: 437  (09/05/2009)    SGOT (AST): 23  (06/03/2010)   SGPT (ALT): 16  (06/03/2010)   Alkaline phosphatase: 95  (06/03/2010)   Total bilirubin: 0.6  (06/03/2010)    Lipid flowsheet reviewed?: Yes   Progress toward LDL goal: At goal  Hypertension   Last Blood Pressure: 162 / 82  (07/01/2010)   Serum creatinine: 1.44  (06/03/2010)   Serum potassium 4.5  (06/03/2010)    Hypertension flowsheet reviewed?: Yes   Progress toward BP goal:  Unchanged  Self-Management Support :   Personal Goals (by the next clinic visit) :      Personal blood pressure goal: 130/80  (09/05/2009)  Personal LDL goal: 100  (09/05/2009)    Hypertension self-management support: Not documented    Lipid self-management support: Not documented

## 2010-09-24 NOTE — Assessment & Plan Note (Signed)
Summary: Fu high bp/mj   Vital Signs:  Patient profile:   65 year old male Weight:      140 pounds Pulse rate:   64 / minute BP sitting:   160 / 82  (right arm)  Vitals Entered By: Mauricia Area CMA, (June 03, 2010 3:26 PM) CC: f/up HTN and flu shot. Is Patient Diabetic? No Pain Assessment Patient in pain? no        Primary Care Provider:  Candelaria Celeste MD  CC:  f/up HTN and flu shot..  History of Present Illness: 1. HTN: - He is normally taking his blood pressure medicines as prescribed.  He didn't take any of his medicines this morning because he wasn't feeling well.  Doesn't know which medicines he takes for his blood pressure.  He doesn't check his blood pressure at home regularly.  ROS: denies chest pain, shortness of breath  2. Itching: - Has noticed this for the past month.  Described as "fly bites" all over his body.  It has been getting worse over the past couple of days.  He used to be able to take an anti itch medicine and that helped but not anymore.  ROS: endorses dry skin  3. Joint pain - Thinks that his hip and knee pain are getting worse.  He has been taking Tramadol but that doesn't seem to be helping anymore.  He would like to be switched to something else    Habits & Providers  Alcohol-Tobacco-Diet     Tobacco Status: quit  Current Medications (verified): 1)  Hyzaar 100-12.5 Mg Tabs (Losartan Potassium-Hctz) .... Take 1 Tablet By Mouth Once A Day 2)  Nitroquick 0.4 Mg Subl (Nitroglycerin) .... Place 1 Tablet Under Tongue As Directed 3)  Seroquel 200 Mg  Tabs (Quetiapine Fumarate) .Marland Kitchen.. 1 Tab Qhs 4)  Ecotrin 325 Mg  Tbec (Aspirin) .... Take 1 Tab Daily 5)  Benztropine Mesylate 1 Mg  Tabs (Benztropine Mesylate) .... One Tab Every At Bedtime 6)  Fish Oil Concentrate 1000 Mg  Caps (Omega-3 Fatty Acids) .... One Tab By Mouth Four  Times A Day With Meals (Okay To Use Otc of Similar Dose Ie 1200) 7)  Fluoxetine Hcl 40 Mg Caps (Fluoxetine Hcl) .... Take  One Tablet Daily At Bedtime 8)  Tylenol Ex St Arthritis Pain 500 Mg  Tabs (Acetaminophen) .... 2 Tabs By Mouth Tid 9)  Cardizem Cd 240 Mg Xr24h-Cap (Diltiazem Hcl Coated Beads) .... Take One Tab Two Times A Day 10)  Tramadol Hcl 50 Mg Tabs (Tramadol Hcl) .Marland Kitchen.. 1-2 Tabs By Mouth Three Times A Day As Needed Severe Pain 11)  Nexium 40 Mg Cpdr (Esomeprazole Magnesium) .... Sig: Take 1 By Mouth Daily 12)  Tricor 145 Mg Tabs (Fenofibrate) .... Sig: Take 1 Tab By Mouth 1 Time Daily Instructions in Spanish 13)  Simvastatin 40 Mg Tabs (Simvastatin) .Marland Kitchen.. 1 By Mouth One Time Daily Spanish Language Instructions 14)  Voltaren 1 % Gel (Diclofenac Sodium) .... Apply Small Amount To Affected Joints Twice A Day As Needed For Pain Dispo: Large Tube 15)  Zyrtec Allergy 10 Mg Tbdp (Cetirizine Hcl) .Marland Kitchen.. 1 Tab By Mouth Daily For Itching  Allergies: 1)  ! Ace Inhibitors 2)  ! Beta Blockers 3)  ! Norvasc  Past History:  Past Medical History: Reviewed history from 06/11/2009 and no changes required. 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) LEG PAIN, BILATERAL (ICD-729.5) SCREENING FOR MALIGNANT NEOPLASM, COLON (ICD-V76.51) AFTERCARE, LONG-TERM USE, MEDICATIONS NEC (ICD-V58.69) SYMPTOM, NOCTURIA (ICD-788.43) FAMILY HISTORY DIABETES 1ST DEGREE RELATIVE (ICD-V18.0) HEARING LOSS NOS OR DEAFNESS (ICD-389.9) BACK PAIN W/RADIATION, UNSPECIFIED (ICD-724.4) DENTAL PAIN (ICD-525.9) SCHIZOPHRENIA (ICD-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  well appearing, no apparent distress Eyes:  vision grossly intact.   Neck:  supple and no masses.   Lungs:  Normal respiratory effort, chest expands symmetrically. Lungs are clear to auscultation, no crackles or wheezes. Heart:  Normal rate and regular rhythm. S1 and S2 normal without gallop, murmur, click, rub or other extra sounds. Abdomen:  soft, non-tender, and normal bowel sounds.   Msk:  knees: bilateral joint line tenderness Extremities:  no lower extremity edema Skin:  dry skin.  No jaundice. Psych:  not anxious appearing and not depressed appearing.     Impression & Recommendations:  Problem # 1:  HYPERTENSION, BENIGN SYSTEMIC (ICD-401.1) Assessment Deteriorated  Didn't take his medicines this morning.  Not sure if he would be at goal if he did.  Will have him come back in 6 weeks and recheck His updated medication list for this problem includes:    Hyzaar 100-12.5 Mg Tabs (Losartan potassium-hctz) .Marland Kitchen... Take 1 tablet by mouth once a day    Cardizem Cd 240 Mg Xr24h-cap (Diltiazem hcl coated beads) .Marland Kitchen... Take one tab two times a day  Orders: [place]- Est  Level 4 ([address]301)  Problem # 2:  PRURITUS (ICD-698.9) Assessment: New Likely from dry skin.  Reviewed normal skin moisturizing precautions.  Will check CMET to make sure liver enzymes are okay. Orders: Comp Met-[facility] ([id number]) [place]- Est  Level 4 (73220)  Problem # 3:  ARTHRALGIA (ICD-719.40) Assessment: Unchanged Not well controlled with Ultram and Tylenol.  Consider Voltaren. Orders: Sed Rate (ESR)-[facility] [phone]) [place]- Est  Level 4 ([address]237)  Problem # 4:  CLL (ICD-204.10) Assessment: Unchanged check CBC today Orders: CBC-[facility] ([id number]) [place]- Est  Level 4 (51761)  Complete Medication List: 1)   Hyzaar 100-12.5 Mg Tabs (Losartan potassium-hctz) .... Take 1 tablet by mouth once a day 2)  Nitroquick 0.4 Mg Subl (Nitroglycerin) .... Place 1 tablet under tongue as directed 3)  Seroquel 200 Mg Tabs (Quetiapine fumarate) .Marland Kitchen.. 1 tab qhs 4)  Ecotrin 325 Mg Tbec (Aspirin) .... Take 1 tab daily 5)  Fish Oil Concentrate 1000 Mg Caps (Omega-3 fatty acids) .... One tab by mouth four  times a day with meals (okay to use otc of similar dose ie 1200) 6)  Fluoxetine Hcl 40 Mg Caps (Fluoxetine hcl) .... Take one tablet daily at bedtime 7)  Tylenol Ex St Arthritis Pain 500 Mg Tabs (Acetaminophen) .... 2 tabs by mouth tid 8)  Cardizem Cd 240 Mg Xr24h-cap (Diltiazem hcl coated beads) .... Take one tab two times a day 9)  Nexium 40 Mg Cpdr (Esomeprazole magnesium) .... Sig: take 1 by mouth daily 10)  Tricor 145 Mg Tabs (Fenofibrate) .... Sig: take 1 tab by mouth 1 time daily instructions in spanish 11)  Simvastatin 40 Mg Tabs (Simvastatin) .Marland Kitchen.. 1 by mouth one time daily spanish language instructions 12)  Voltaren 1 % Gel (Diclofenac sodium) .... Apply small amount to affected joints twice a day as needed for pain dispo: large tube 13)  Zyrtec Allergy 10 Mg Tbdp (Cetirizine hcl) .Marland Kitchen.. 1 tab by mouth daily for itching  Other Orders: Influenza Vaccine NON MCR (01751)  Patient Instructions: 1)  We will get some blood work today and will let you know of the results 2)  Start taking Zyrtec everyday for the itching 3)  You can use the Voltaren gel for the joint pain 4)  Please schedule a follow up appointment in 3 weeks Prescriptions: ZYRTEC ALLERGY 10 MG TBDP (CETIRIZINE HCL) 1 tab by mouth daily for itching  #30 x 3   Entered and Authorized by:   Mylinda Latina MD   Signed by:   Mylinda Latina MD on 06/03/2010   Method used:   Print then Give to Patient   RxID:   [id number] VOLTAREN 1 % GEL (DICLOFENAC SODIUM) Apply small amount to affected joints twice a day as needed for pain Dispo: large tube  #1  x 3   Entered and Authorized by:   Mylinda Latina MD   Signed by:   Mylinda Latina MD on 06/03/2010   Method used:   Print then Give to Patient   RxID:   [id number]    Influenza Vaccine    Vaccine Type: Fluvax Non-MCR    Site: left deltoid    Mfr: GlaxoSmithKline    Dose: 0.5 ml    Route: IM    Given by: Mauricia Area CMA,    Exp. Date: 02/19/2011    Lot #: [id number]    VIS given: 03/19/10 version given June 03, 2010.  Flu Vaccine Consent Questions    Do you have a history of severe allergic reactions to this vaccine? no    Any prior history of allergic reactions to egg and/or gelatin? no    Do you have a sensitivity to the preservative Thimersol? no    Do you have a past history of Guillan-Barre Syndrome? no    Do you currently have an acute febrile illness? no    Have you ever had a severe reaction to latex? no    Vaccine information given and explained to patient? yes   Prevention & Chronic Care Immunizations   Influenza vaccine: Fluvax Non-MCR  (06/03/2010)   Influenza vaccine due: 05/22/2009    Tetanus booster: 10/23/2005: Done.   Tetanus booster due: 10/24/2015    Pneumococcal vaccine: Pneumovax (Medicare)  (01/03/2008)   Pneumococcal vaccine due: None    H. zoster vaccine: 08/07/2008: given  Colorectal Screening   Hemoccult: normal  (05/30/2008)   Hemoccult action/deferral: Given x 3  (12/14/2006)   Hemoccult due: 05/30/2009    Colonoscopy: Not documented   Colonoscopy due: Refused  (05/22/2008)  Other Screening   PSA: 0.32  (01/01/2009)   PSA due due: 01/20/2008   Smoking status: quit  (06/03/2010)  Lipids   Total Cholesterol: 229  (05/17/2009)   LDL: See Comment mg/dL  (05/17/2009)   LDL Direct: 42  (10/23/2009)   HDL: 28  (05/17/2009)   Triglycerides: 437  (09/05/2009)    SGOT (AST): 28  (02/08/2010)   SGPT (ALT): 23  (02/08/2010) CMP ordered    Alkaline phosphatase: 67  (02/08/2010)   Total bilirubin: 0.5   (02/08/2010)  Hypertension   Last Blood Pressure: 160 / 82  (06/03/2010)  Serum creatinine: 1.70  (02/08/2010)   Serum potassium 3.6  (02/08/2010) CMP ordered     Hypertension flowsheet reviewed?: Yes   Progress toward BP goal: Deteriorated  Self-Management Support :   Personal Goals (by the next clinic visit) :      Personal blood pressure goal: 130/80  (09/05/2009)     Personal LDL goal: 100  (09/05/2009)    Patient will work on the following items until the next clinic visit to reach self-care goals:     Medications and monitoring: take my medicines every day  (06/03/2010)     Eating: eat more vegetables, eat foods that are low in salt  (06/03/2010)    Hypertension self-management support: Not documented    Lipid self-management support: Not documented     Self-management comments: Didn't take his blood pressure medications this AM

## 2010-09-24 NOTE — Progress Notes (Signed)
    Tried to call patient to discuss labs, especially good LDL control and slight increase in serum Cr.  [place] phone out of service area; will send letter to home.  Dalbert Mayotte MD  November 01, 2009 9:44 AM

## 2010-09-24 NOTE — Letter (Signed)
Summary: Generic Letter  [facility]  [address]   [place], [place] [address]   Phone: [phone]  Fax: [phone]    11/01/2009  [facility] [address] [place], [place]  [address]  [place],   Espero que esta carta le encuentre bien.  Escribo con los [place] de sus laboratorios.  Trate' de llamar pero el telefono no recibia la llamada.  Le felicito por el excelente resultado del colesterol.  Los demas valores estan bien, menos uno de los valores de la funcion renal, que esta' un poco elevado.  Recomiendo que esto se vuelva a chequear futuramente para ver si permanece alto.  Por favor llame con cualquier pregunta, y asegure que los contactos que tenemos para [facility] son todavia vigentes.  El telefono suyo que tenemos en [place] es: [phone].      Sincerely,   Dalbert Mayotte MD  Appended Document: Generic Letter mailed

## 2010-09-24 NOTE — Letter (Signed)
Summary: [place] Certification of Disability  [place] By: Raymond Gurney 09/06/2009 11:48:08  _____________________________________________________________________  External Attachment:    Type:   Image     Comment:   External Document

## 2010-09-24 NOTE — Assessment & Plan Note (Signed)
Summary: f/u eo   Vital Signs:  Patient profile:   65 year old male Height:      63 inches Weight:      140 pounds BMI:     24.89 Pulse rate:   66 / minute BP sitting:   167 / 93  (left arm) Cuff size:   regular  Vitals Entered By: Schuyler Amor CMA (July 15, 2010 2:39 PM) CC: F/U  Is Patient Diabetic? No Pain Assessment Patient in pain? no        Primary Care Provider:  Candelaria Celeste MD  CC:  F/U .  History of Present Illness: He was sick last week in [place] on the 16th after driving a car there for his daughter's use. Nausea and headache for two days during which he flew back to [place]. On the 18th he started vomiting, never got diarrhea and got better on the 19th.   Didn't bring his medicines, but we reviewed them. Not taking his fish oil not having had a chance to buy them. Not takin Ranitidine. His pruritis is improved  Not currently having sexual relations and doesn't have a girlfriend. Has reconciled somewhat with his wife, but she is staying in [place] to take care of a daughter's children while she finished Surgical tech training.   complains of knee and ankle pain and of his curved back. Requests a handicapped auto license.   Habits & Providers  Alcohol-Tobacco-Diet     Tobacco Status: quit  Allergies: 1)  ! Ace Inhibitors 2)  ! Beta Blockers 3)  ! Norvasc  Physical Exam  General:    well appearing, no apparent distress. Thinner. Scratching various areas at times.   Lungs:  Normal respiratory effort, chest expands symmetrically. Lungs are clear to auscultation, no crackles or wheezes. Heart:  RRR, no murmur Abdomen:  soft, non-tender, and normal bowel sounds.  No splenomegaly Msk:  knees: bilateral joint line tenderness. No enlargement or redness. Moves around as if his joints are stiff.   No scoliosis evident on forward bending Skin:  no rashes.     Impression & Recommendations:  Problem # 1:  GASTROENTERITIS, VIRAL (ICD-008.8) resolved, caused  mild weight loss  Problem # 2:  HYPERCALCEMIA (ICD-275.42) Mild, but precludes HCTZ use. Measure PtH if increases  Problem # 3:  PRURITUS (ICD-698.9) Improved. No longer taking Ranitidine.   Problem # 4:  HYPERTENSION, BENIGN SYSTEMIC (ICD-401.1) Impotence not a current concern. Will try hydralazine to improve control His updated medication list for this problem includes:    Cardizem Cd 240 Mg Xr24h-cap (Diltiazem hcl coated beads) .Marland Kitchen... Take one tab two times a day    Hydralazine Hcl 10 Mg Tabs (Hydralazine hcl) .Marland Kitchen... Take one tab three times a day  Orders: [place]- Est  Level 4 (03546)  Problem # 5:  OSTEOARTHRITIS, MULTI SITES (ICD-715.98) I believe he is embelishing his symptoms, but handicap license application was signed.  His updated medication list for this problem includes:    Ecotrin 325 Mg Tbec (Aspirin) .Marland Kitchen... Take 1 tab daily    Tylenol Ex St Arthritis Pain 500 Mg Tabs (Acetaminophen) .Marland Kitchen... 2 tabs by mouth tid  Orders: [place]- Est  Level 4 (99214)  Problem # 6:  CLL (ICD-204.10) No sign of conversion to ALL  Complete Medication List: 1)  Nitroquick 0.4 Mg Subl (Nitroglycerin) .... Place 1 tablet under tongue as directed 2)  Seroquel 200 Mg Tabs (Quetiapine fumarate) .Marland Kitchen.. 1 tab qhs 3)  Ecotrin 325 Mg Tbec (Aspirin) .Marland KitchenMarland KitchenMarland Kitchen  Take 1 tab daily 4)  Fish Oil Concentrate 1000 Mg Caps (Omega-3 fatty acids) .... One tab by mouth four  times a day with meals (okay to use otc of similar dose ie 1200) 5)  Fluoxetine Hcl 40 Mg Caps (Fluoxetine hcl) .... Take one tablet daily at bedtime 6)  Tylenol Ex St Arthritis Pain 500 Mg Tabs (Acetaminophen) .... 2 tabs by mouth tid 7)  Cardizem Cd 240 Mg Xr24h-cap (Diltiazem hcl coated beads) .... Take one tab two times a day 8)  Nexium 40 Mg Cpdr (Esomeprazole magnesium) .... Sig: take 1 by mouth daily 9)  Tricor 145 Mg Tabs (Fenofibrate) .... Sig: take 1 tab by mouth 1 time daily instructions in spanish 10)  Simvastatin 40 Mg Tabs (Simvastatin) .Marland Kitchen..  1 by mouth one time daily spanish language instructions 11)  Zyrtec Allergy 10 Mg Tbdp (Cetirizine hcl) .Marland Kitchen.. 1 tab by mouth daily for itching 12)  Sarna 0.5-0.5 % Lotn (Camphor-menthol) .... Apply to skin two times a day 13)  Hydralazine Hcl 10 Mg Tabs (Hydralazine hcl) .... Take one tab three times a day  Patient Instructions: 1)  Lleve todos de los frascos a la visita en un mes.  2)  Empiece Hydralazine 10 mg tres veces al dia para la presion alta. 3)  Please schedule a follow-up appointment in 1 month.  Prescriptions: HYDRALAZINE HCL 10 MG TABS (HYDRALAZINE HCL) Take one tab three times a day  #90 x 1   Entered and Authorized by:   Candelaria Celeste MD   Signed by:   Candelaria Celeste MD on 07/15/2010   Method used:   Electronically to        [facility]. [address] [phone]* (retail)       [address], [place]  [address]       Ph: [phone] or [phone]       Fax: [phone]   RxID:   [id number] HYDRALAZINE HCL 10 MG TABS (HYDRALAZINE HCL) Take one tab three times a day  #90 x 0   Entered and Authorized by:   Candelaria Celeste MD   Signed by:   Candelaria Celeste MD on 07/15/2010   Method used:   Historical   RxID:   [id number]    Orders Added: 1)  [facility]- Est  Level 4 [12248]     Prevention & Chronic Care Immunizations   Influenza vaccine: Fluvax Non-MCR  (06/03/2010)   Influenza vaccine due: 05/22/2009    Tetanus booster: 10/23/2005: Done.   Tetanus booster due: 10/24/2015    Pneumococcal vaccine: Pneumovax (Medicare)  (01/03/2008)   Pneumococcal vaccine due: None    H. zoster vaccine: 08/07/2008: given  Colorectal Screening   Hemoccult: normal  (05/30/2008)   Hemoccult action/deferral: Given x 3  (12/14/2006)   Hemoccult due: 05/30/2009    Colonoscopy: Not documented   Colonoscopy due: Refused  (05/22/2008)  Other Screening   PSA: 0.32  (01/01/2009)   PSA due due: 01/20/2008   Smoking status: quit  (07/15/2010)  Lipids   Total  Cholesterol: 229  (05/17/2009)   LDL: See Comment mg/dL  (05/17/2009)   LDL Direct: 42  (10/23/2009)   HDL: 28  (05/17/2009)   Triglycerides: 437  (09/05/2009)    SGOT (AST): 23  (06/03/2010)   SGPT (ALT): 16  (06/03/2010)   Alkaline phosphatase: 95  (06/03/2010)   Total bilirubin: 0.6  (06/03/2010)    Lipid  flowsheet reviewed?: Yes   Progress toward LDL goal: At goal  Hypertension   Last Blood Pressure: 167 / 93  (07/15/2010)   Serum creatinine: 1.49  (07/01/2010)   Serum potassium 3.9  (07/01/2010)    Hypertension flowsheet reviewed?: Yes   Progress toward BP goal: Unchanged   Hypertension comments: hydralazine started  Self-Management Support :   Personal Goals (by the next clinic visit) :      Personal blood pressure goal: 130/80  (09/05/2009)     Personal LDL goal: 100  (09/05/2009)    Hypertension self-management support: Not documented    Lipid self-management support: Not documented

## 2010-09-24 NOTE — Assessment & Plan Note (Signed)
Summary: med refill   Vital Signs:  Patient profile:   65 year old male Height:      63 inches Weight:      147.1 pounds BMI:     26.15 Temp:     98.2 degrees F oral Pulse rate:   59 / minute BP sitting:   110 / 70  (left arm) Cuff size:   regular  Vitals Entered By: Isabelle Course (September 26, 2009 11:30 AM) CC: Med refills Is Patient Diabetic? No Pain Assessment Patient in pain? no        Primary Care Provider:  Candelaria Celeste MD  CC:  Med refills.  History of Present Illness: Visit conducted in [place].  Patient continues to do well. Says his pharmacy was not dispensing his meds without a refill from the doctor.  He offers no other complaints.  He had been to see a dentist on [address], was referred to another dentist for more extensive dental work.  He was told he needed dentures but the only dentist who was able to do this for him charged more than he can afford.  Would like other dental resources.   Denies chest pain or shortness of breath.  In general is feeling well.  Habits & Providers  Alcohol-Tobacco-Diet     Tobacco Status: never  Allergies: 1)  ! Ace Inhibitors 2)  ! Beta Blockers 3)  ! Norvasc  Social History: Smoking Status:  never  Physical Exam  General:  Well-developed,well-nourished,in no acute distress; alert,appropriate and cooperative throughout examination Mouth:  MMM. Several missing teeth, no gingival erythema.  Teeth in various states of disrepair. Neck:  No deformities, masses, or tenderness noted. Lungs:  Normal respiratory effort, chest expands symmetrically. Lungs are clear to auscultation, no crackles or wheezes. Heart:  Normal rate and regular rhythm. S1 and S2 normal without gallop, murmur, click, rub or other extra sounds.   Impression & Recommendations:  Problem # 1:  HYPERTENSION, BENIGN SYSTEMIC (ICD-401.1)  refills on his medications. His updated medication list for this problem includes:    Hyzaar 100-12.5 Mg Tabs  (Losartan potassium-hctz) .Marland Kitchen... Take 1 tablet by mouth once a day    Cardizem Cd 240 Mg Xr24h-cap (Diltiazem hcl coated beads) .Marland Kitchen... Take one tab two times a day  Orders: [place]- Est  Level 4 (65784)  Problem # 2:  HYPERLIPIDEMIA (ICD-272.4) Elevated triglycerides in the 400s in January. Is taking Tricor without side effects. To check LFTs and Lipids again at end of February.  His updated medication list for this problem includes:    Simvastatin 80 Mg Tabs (Simvastatin) .Marland Kitchen... Take 1 tablet by mouth once a day    Tricor 145 Mg Tabs (Fenofibrate) ..... Sig: take 1 tab by mouth 1 time daily instructions in spanish  Orders: [facility]- Est  Level 4 (99214)Future Orders: Lipid-[facility] ([id number]) ... 10/02/2010 Comp Met-[facility] ([id number]) ... 11/07/2010  Problem # 3:  DENTAL PAIN (ICD-525.9) Not having pain at this time, but does need several dental issues addressed.  Given a list of dentists who accept Medicaid.   Complete Medication List: 1)  Hyzaar 100-12.5 Mg Tabs (Losartan potassium-hctz) .... Take 1 tablet by mouth once a day 2)  Nitroquick 0.4 Mg Subl (Nitroglycerin) .... Place 1 tablet under tongue as directed 3)  Seroquel 200 Mg Tabs (Quetiapine fumarate) .Marland Kitchen.. 1 tab qhs 4)  Simvastatin 80 Mg Tabs (Simvastatin) .... Take 1 tablet by mouth once a day 5)  Ecotrin 325 Mg Tbec (Aspirin) .... Take  1 tab daily 6)  Benztropine Mesylate 1 Mg Tabs (Benztropine mesylate) .... One tab every at bedtime 7)  Fish Oil Concentrate 1000 Mg Caps (Omega-3 fatty acids) .... One tab by mouth four  times a day with meals (okay to use otc of similar dose ie 1200) 8)  Fluoxetine Hcl 40 Mg Caps (Fluoxetine hcl) .... Take one tablet daily at bedtime 9)  Tylenol Ex St Arthritis Pain 500 Mg Tabs (Acetaminophen) .... 2 tabs by mouth tid 10)  Cardizem Cd 240 Mg Xr24h-cap (Diltiazem hcl coated beads) .... Take one tab two times a day 11)  Tramadol Hcl 50 Mg Tabs (Tramadol hcl) .Marland Kitchen.. 1-2 tabs by mouth three times a day as  needed severe pain 12)  Nexium 40 Mg Cpdr (Esomeprazole magnesium) .... Sig: take 1 by mouth daily 13)  Tricor 145 Mg Tabs (Fenofibrate) .... Sig: take 1 tab by mouth 1 time daily instructions in spanish 14)  Hydroxyzine Pamoate 25 Mg Caps (Hydroxyzine pamoate) .... Sig 1 to 2 caps by mouth one time at bedtime for itching instructions in spanish  Patient Instructions: 1)  Fue un placer verle hoy.  2)  Quiero chequearle el perfil lipidico al final de [place] o inicio de [place], para ver si los triglicerios se bajaron como queriamos. Prescriptions: HYZAAR 100-12.5 MG TABS (LOSARTAN POTASSIUM-HCTZ) Take 1 tablet by mouth once a day  #90 x 3   Entered and Authorized by:   Dalbert Mayotte MD   Signed by:   Dalbert Mayotte MD on 09/26/2009   Method used:   Electronically to        [facility]. [address] [phone]* (retail)       [address], [place]  [address]       Ph: [phone] or [phone]       Fax: [phone]   RxID:   [id number] HYDROXYZINE PAMOATE 25 MG CAPS (HYDROXYZINE PAMOATE) SIG 1 to 2 caps by mouth one time at bedtime for itching Instructions in Spanish  #120 x 3   Entered and Authorized by:   Dalbert Mayotte MD   Signed by:   Dalbert Mayotte MD on 09/26/2009   Method used:   Electronically to        [facility]. [address] [phone]* (retail)       [address], [place]  [address]       Ph: [phone] or [phone]       Fax: [phone]   RxID:   [id number] CARDIZEM CD 240 MG XR24H-CAP (DILTIAZEM HCL COATED BEADS) Take one tab two times a day  #90 x 3   Entered and Authorized by:   Dalbert Mayotte MD   Signed by:   Dalbert Mayotte MD on 09/26/2009   Method used:   Electronically to        [facility]. [address] [phone]* (retail)       [address], [place]  [address]       Ph: [phone] or [phone]       Fax: [phone]   RxID:   [id number] FLUOXETINE HCL 40 MG CAPS  (FLUOXETINE HCL) Take one tablet daily at bedtime  #90 x 3   Entered and Authorized by:  Dalbert Mayotte MD   Signed by:   Dalbert Mayotte MD on 09/26/2009   Method used:   Electronically to        [facility]. [address] [phone]* (retail)       [address]       [place], [place]  [address]       Ph: [phone] or [phone]       Fax: [phone]   RxID:   [id number]

## 2010-09-24 NOTE — Assessment & Plan Note (Signed)
Summary: f/u htn   Vital Signs:  Patient profile:   65 year old male Height:      63 inches Weight:      148 pounds BMI:     26.31 Temp:     97.9 degrees F oral Pulse rate:   64 / minute BP sitting:   140 / 90  (left arm) Cuff size:   regular  Vitals Entered By: Nathan Richards CMA (September 05, 2009 9:12 AM) CC: F/U htn Is Patient Diabetic? No Pain Assessment Patient in pain? no        Primary Care Provider:  Candelaria Celeste MD  CC:  F/U htn.  History of Present Illness: Visit conducted in [place].  Nathan Richards reports that he is feeling very well, better than usual.  Says his arthritic pains in his legs and knees have improved significantly in the winter, not using the tramadol or Tylenol arthritis much at all now.  Denies any chest pain.  Had a negative Myovue and his cardiologist stopped his Plavix.  Old notes reviewed from cardiology office in EMR.  He did not take his medications this morning, attributes his mildly elevated BP this morning on this.  Denies chest pain, denies dypsnea, reports only trace bipedal edema noted more that the end of the day.   Meds list reviewed and reconciliation performed.  He has a form from the [facility] tax appraisers requesting verification from a [place] licensed physician to attest to his qualification for permanent and total disability.  Nathan Richards reports that he has been receiving Disablity cheques since he was declared disabled in 1994. Has history of CLL, also schizophrenia with major depression.  Form completed and signed, sent to be scanned into EMR.  Habits & Providers  Alcohol-Tobacco-Diet     Tobacco Status: quit  Current Medications (verified): 1)  Hyzaar 100-12.5 Mg Tabs (Losartan Potassium-Hctz) .... Take 1 Tablet By Mouth Once A Day 2)  Nitroquick 0.4 Mg Subl (Nitroglycerin) .... Place 1 Tablet Under Tongue As Directed 3)  Seroquel 200 Mg  Tabs (Quetiapine Fumarate) .Marland Kitchen.. 1 Tab Qhs 4)  Simvastatin 80 Mg Tabs (Simvastatin) .... Take  1 Tablet By Mouth Once A Day 5)  Ecotrin 325 Mg  Tbec (Aspirin) .... Take 1 Tab Daily 6)  Benztropine Mesylate 1 Mg  Tabs (Benztropine Mesylate) .... One Tab Every At Bedtime 7)  Fish Oil Concentrate 1000 Mg  Caps (Omega-3 Fatty Acids) .... One Tab By Mouth Four  Times A Day With Meals (Okay To Use Otc of Similar Dose Ie 1200) 8)  Fluoxetine Hcl 40 Mg Caps (Fluoxetine Hcl) .... Take One Tablet Daily At Bedtime 9)  Tylenol Ex St Arthritis Pain 500 Mg  Tabs (Acetaminophen) .... 2 Tabs By Mouth Tid 10)  Cardizem Cd 240 Mg Xr24h-Cap (Diltiazem Hcl Coated Beads) .... Take One Tab Two Times A Day 11)  Tramadol Hcl 50 Mg Tabs (Tramadol Hcl) .Marland Kitchen.. 1-2 Tabs By Mouth Three Times A Day As Needed Severe Pain 12)  Nexium 40 Mg Cpdr (Esomeprazole Magnesium) .... Sig: Take 1 By Mouth Daily  Allergies (verified): 1)  ! Ace Inhibitors 2)  ! Beta Blockers 3)  ! Norvasc  Social History: From [place].   Lives in [place] w/ spouse.  2 children, son and daughter with her She has a son from a prior relationship  Another child killed at age 42.  Quit smoking 1999, started age 32  No etoh, remote cocaine, MJ.   Unemployed.  Disabled; [place], [place]  [name] lives locally [employer] member  Sep 05, 2009: Wife left his house; he feels better without her there. LIves with son, who is only sporadically at home.  Not a smoker. No alcohol now.  Two granddaughters in [place], [place].  Physical Exam  General:  Alert, pleasant and upbeat mood. No apparent distress. Fluid speech. Eyes:  clear sclerae bilaterally. Mouth:  Oral mucosa and oropharynx without lesions or exudates.  Neck:  No deformities, masses, or tenderness noted. Lungs:  Normal respiratory effort, chest expands symmetrically. Lungs are clear to auscultation, no crackles or wheezes. Heart:  Normal rate and regular rhythm. S1 and S2 normal without gallop, murmur, click, rub or other extra sounds. Abdomen:  Bowel sounds positive,abdomen soft and  non-tender without masses, organomegaly or hernias noted. Extremities:  Trace pitting edema just above malleoli bilaterally. No calf tenderness, palpable dp pulses bilaterally.  Neurologic:  gait normal.     Impression & Recommendations:  Problem # 1:  HYPERTENSION, BENIGN SYSTEMIC (ICD-401.1) Not optimally controlled, although today's measurement without taking morning meds.  Not many options to increase or add meds to HTN regimen.  I asked him to take meds before next visit in 1 to 2 months.  His updated medication list for this problem includes:    Hyzaar 100-12.5 Mg Tabs (Losartan potassium-hctz) .Marland Kitchen... Take 1 tablet by mouth once a day    Cardizem Cd 240 Mg Xr24h-cap (Diltiazem hcl coated beads) .Marland Kitchen... Take one tab two times a day  Orders: Comp Met[facility] ([id number]) [place]- Est  Level 4 ([address])  Problem # 2:  HYPERLIPIDEMIA (ICD-272.4) Elevated TGs noted on prior lipid panel.  Direct LDL was within range.  Will recheck TG level today, consider medication to reduce this.  Family hx DM, will recheck glucose (had small amt milk in his coffee this morning.) His updated medication list for this problem includes:    Simvastatin 80 Mg Tabs (Simvastatin) .Marland Kitchen... Take 1 tablet by mouth once a day  Orders: Triglycerides-[facility] (37628) [facility]- Est  Level 4 (31517)  Problem # 3:  CLL (ICD-204.10) Recheck of his CBC with diff today. Prior WBC values were reviewed in office visit today.  Orders: CBC w/Diff-[facility] (85025) [place]- Est  Level 4 (61607)  Problem # 4:  CHEST PAIN (ICD-786.50)  Resolved. No chest pain now.  Stopped the Plavix as per his cardiologist.  Continues with the ASA.  Not usin NTG.  Will attempt to optimize lipids and hypertensive control.  Orders: [facility]- Est  Level 4 ([address]214)  Complete Medication List: 1)  Hyzaar 100-12.5 Mg Tabs (Losartan potassium-hctz) .... Take 1 tablet by mouth once a day 2)  Nitroquick 0.4 Mg Subl (Nitroglycerin) .... Place 1 tablet under tongue as directed 3)   Seroquel 200 Mg Tabs (Quetiapine fumarate) .Marland Kitchen.. 1 tab qhs 4)  Simvastatin 80 Mg Tabs (Simvastatin) .... Take 1 tablet by mouth once a day 5)  Ecotrin 325 Mg Tbec (Aspirin) .... Take 1 tab daily 6)  Benztropine Mesylate 1 Mg Tabs (Benztropine mesylate) .... One tab every at bedtime 7)  Fish Oil Concentrate 1000 Mg Caps (Omega-3 fatty acids) .... One tab by mouth four  times a day with meals (okay to use otc of similar dose ie 1200) 8)  Fluoxetine Hcl 40 Mg Caps (Fluoxetine hcl) .... Take one tablet daily at bedtime 9)  Tylenol Ex St Arthritis Pain 500 Mg Tabs (Acetaminophen) .... 2 tabs by mouth tid 10)  Cardizem Cd 240 Mg Xr24h-cap (Diltiazem hcl coated beads) .Marland KitchenMarland KitchenMarland Kitchen  Take one tab two times a day 11)  Tramadol Hcl 50 Mg Tabs (Tramadol hcl) .Marland Kitchen.. 1-2 tabs by mouth three times a day as needed severe pain 12)  Nexium 40 Mg Cpdr (Esomeprazole magnesium) .... Sig: take 1 by mouth daily  Patient Instructions: 1)  Fue un placer verle hoy.  Estamos volviendo a [employer] laboratorios para el colesterol, la funcion de los rinones, y Teaching laboratory technician. 2)  Hoy le estamos la vacuna del flu. 3)  Por favor regrese a la [employer]. 4)  FOLLOW UP APPT IN 2 MONTHS WITH EITHER [facility] OR Andreah Goheen. Prescriptions: NEXIUM 40 MG CPDR (ESOMEPRAZOLE MAGNESIUM) SIG: Take 1 by mouth daily  #90 x 3   Entered and Authorized by:   Dalbert Mayotte MD   Signed by:   Dalbert Mayotte MD on 09/05/2009   Method used:   Electronically to        [facility]. [address]. [phone]* (retail)       [address]       [place], [place]  [address]       Ph: [phone] or [phone]       Fax: [phone]   RxID:   [id number]    Prevention & Chronic Care Immunizations   Influenza vaccine: given  (05/22/2008)   Influenza vaccine due: 05/22/2009    Tetanus booster: 10/23/2005: Done.   Tetanus booster due: 10/24/2015    Pneumococcal vaccine: Pneumovax (Medicare)  (01/03/2008)   Pneumococcal vaccine due: None     H. zoster vaccine: 08/07/2008: given  Colorectal Screening   Hemoccult: normal  (05/30/2008)   Hemoccult action/deferral: Given x 3  (12/14/2006)   Hemoccult due: 05/30/2009    Colonoscopy: Not documented   Colonoscopy due: Refused  (05/22/2008)  Other Screening   PSA: 0.32  (01/01/2009)   PSA due due: 01/20/2008   Smoking status: quit  (09/05/2009)  Lipids   Total Cholesterol: 319  (03/27/2009)   LDL: See Comment mg/dL  (03/27/2009)   LDL Direct: 39.1  (03/04/2007)   HDL: 26  (03/27/2009)   Triglycerides: 606  (03/27/2009)    SGOT (AST): 13  (03/27/2009)   SGPT (ALT): 10  (03/27/2009) CMP ordered    Alkaline phosphatase: 89  (03/27/2009)   Total bilirubin: 0.5  (03/27/2009)    Lipid flowsheet reviewed?: Yes   Progress toward LDL goal: Unchanged  Hypertension   Last Blood Pressure: 140 / 90  (09/05/2009)   Serum creatinine: 1.40  (03/27/2009)   Serum potassium 3.6  (03/27/2009) CMP ordered     Hypertension flowsheet reviewed?: Yes   Progress toward BP goal: Unchanged  Self-Management Support :   Personal Goals (by the next clinic visit) :      Personal blood pressure goal: 130/80  (09/05/2009)     Personal LDL goal: 100  (09/05/2009)    Hypertension self-management support: Not documented    Lipid self-management support: Not documented

## 2010-09-24 NOTE — Assessment & Plan Note (Signed)
Summary: chol check/ mj/hale   Vital Signs:  Patient profile:   65 year old male Weight:      144.2 pounds BMI:     25.64 Temp:     97.6 degrees F oral Pulse rate:   65 / minute BP sitting:   160 / 80  (right arm) Cuff size:   regular  Vitals Entered By: Jimmy Footman, CMA (February 08, 2010 8:40 AM) CC: Med check/chol check Is Patient Diabetic? No Pain Assessment Patient in pain? no        Primary Care Provider:  Zachery Dauer MD  CC:  Med check/chol check.  History of Present Illness: Visit conducted in [place].   Nathan Richards comes in today for followup of his cholesterol panel with mildly elevated transaminases and Cr in March 2011.  Has been feeling well, taking meds as directed. Continues to follow up with Psychiatrist for seroquel and SSRI.   Denies chest pain, does not use the NTG sl that is prescribed to him.   Has noticed a lump along the dorsum of his R foot, which is only bothersome if he tightens his shoe a lot.  Otherwise, he doesn';t notice it.  No other lumps or adenopathy noted elsewhere in body.  Has not had fevers or chills, no anorexia or subjective weight loss.   Habits & Providers  Alcohol-Tobacco-Diet     Tobacco Status: quit     Tobacco Counseling: to remain off tobacco products  Current Medications (verified): 1)  Hyzaar 100-12.5 Mg Tabs (Losartan Potassium-Hctz) .... Take 1 Tablet By Mouth Once A Day 2)  Nitroquick 0.4 Mg Subl (Nitroglycerin) .... Place 1 Tablet Under Tongue As Directed 3)  Seroquel 200 Mg  Tabs (Quetiapine Fumarate) .Marland Kitchen.. 1 Tab Qhs 4)  Ecotrin 325 Mg  Tbec (Aspirin) .... Take 1 Tab Daily 5)  Benztropine Mesylate 1 Mg  Tabs (Benztropine Mesylate) .... One Tab Every At Bedtime 6)  Fish Oil Concentrate 1000 Mg  Caps (Omega-3 Fatty Acids) .... One Tab By Mouth Four  Times A Day With Meals (Okay To Use Otc of Similar Dose Ie 1200) 7)  Fluoxetine Hcl 40 Mg Caps (Fluoxetine Hcl) .... Take One Tablet Daily At Bedtime 8)  Tylenol Ex St Arthritis Pain  500 Mg  Tabs (Acetaminophen) .... 2 Tabs By Mouth Tid 9)  Cardizem Cd 240 Mg Xr24h-Cap (Diltiazem Hcl Coated Beads) .... Take One Tab Two Times A Day 10)  Tramadol Hcl 50 Mg Tabs (Tramadol Hcl) .Marland Kitchen.. 1-2 Tabs By Mouth Three Times A Day As Needed Severe Pain 11)  Nexium 40 Mg Cpdr (Esomeprazole Magnesium) .... Sig: Take 1 By Mouth Daily 12)  Tricor 145 Mg Tabs (Fenofibrate) .... Sig: Take 1 Tab By Mouth 1 Time Daily Instructions in Spanish 13)  Hydroxyzine Pamoate 25 Mg Caps (Hydroxyzine Pamoate) .... Sig 1 To 2 Caps By Mouth One Time At Bedtime For Itching Instructions in Spanish 14)  Simvastatin 40 Mg Tabs (Simvastatin) .Marland Kitchen.. 1 By Mouth One Time Daily Spanish Language Instructions  Allergies (verified): 1)  ! Ace Inhibitors 2)  ! Beta Blockers 3)  ! Norvasc  Social History: Smoking Status:  quit  Physical Exam  General:  well appearing, no apparent distress Neck:  neck supple without anterior or posterior cervical adenopathy; no supraclavicular or axillary adenopathy noted on palpation.  Pulses:  palpable dp pulses bilaterally, without edema.   Firm 1cm2 mass dorsum R foot, which does not move with flexion/extension R great toe.  Brisk cap refill, sensation  in feet grossly intact. Mass is firm and nonfluctuant, not erythematous.    Impression & Recommendations:  Problem # 1:  HYPERLIPIDEMIA (KGM-010.4) Well controlled cholesterol panel, with mildly increased Cr and Transaminases on last draw in March.  Recheck today.  We discussed risks of Simva $Remove'80mg'rponEvj$  daily; will reduce to $RemoveB'40mg'ewXhDFyz$  daily and recheck in 3 months at next visit.  The following medications were removed from the medication list:    Simvastatin 80 Mg Tabs (Simvastatin) .Marland Kitchen... Take 1 tablet by mouth once a day His updated medication list for this problem includes:    Tricor 145 Mg Tabs (Fenofibrate) ..... Sig: take 1 tab by mouth 1 time daily instructions in spanish    Simvastatin 40 Mg Tabs (Simvastatin) .Marland Kitchen... 1 by mouth one time  daily spanish language instructions  Orders: Comp Met-[facility] ([id number]) [facility]- Est Level  3 (34742)  Problem # 2:  HYPERTENSION, BENIGN SYSTEMIC (ICD-401.1) mildly elevated BP today. Continue same regimen and recheck next visit. His updated medication list for this problem includes:    Hyzaar 100-12.5 Mg Tabs (Losartan potassium-hctz) .Marland Kitchen... Take 1 tablet by mouth once a day    Cardizem Cd 240 Mg Xr24h-cap (Diltiazem hcl coated beads) .Marland Kitchen... Take one tab two times a day  Orders: Comp Met[facility] ([id number]) [facility]- Est Level  3 (33295)  Problem # 3:  LIPOMA (ICD-214.9)  Lipomatous lesion on dorsum of R foot.  Attempted to aspirate, without expression of any viscous or liquid.  Not bothering him now; to watch.   Orders: [facility]- Est Level  3 (18841)  Complete Medication List: 1)  Hyzaar 100-12.5 Mg Tabs (Losartan potassium-hctz) .... Take 1 tablet by mouth once a day 2)  Nitroquick 0.4 Mg Subl (Nitroglycerin) .... Place 1 tablet under tongue as directed 3)  Seroquel 200 Mg Tabs (Quetiapine fumarate) .Marland Kitchen.. 1 tab qhs 4)  Ecotrin 325 Mg Tbec (Aspirin) .... Take 1 tab daily 5)  Benztropine Mesylate 1 Mg Tabs (Benztropine mesylate) .... One tab every at bedtime 6)  Fish Oil Concentrate 1000 Mg Caps (Omega-3 fatty acids) .... One tab by mouth four  times a day with meals (okay to use otc of similar dose ie 1200) 7)  Fluoxetine Hcl 40 Mg Caps (Fluoxetine hcl) .... Take one tablet daily at bedtime 8)  Tylenol Ex St Arthritis Pain 500 Mg Tabs (Acetaminophen) .... 2 tabs by mouth tid 9)  Cardizem Cd 240 Mg Xr24h-cap (Diltiazem hcl coated beads) .... Take one tab two times a day 10)  Tramadol Hcl 50 Mg Tabs (Tramadol hcl) .Marland Kitchen.. 1-2 tabs by mouth three times a day as needed severe pain 11)  Nexium 40 Mg Cpdr (Esomeprazole magnesium) .... Sig: take 1 by mouth daily 12)  Tricor 145 Mg Tabs (Fenofibrate) .... Sig: take 1 tab by mouth 1 time daily instructions in spanish 13)  Hydroxyzine Pamoate 25 Mg Caps  (Hydroxyzine pamoate) .... Sig 1 to 2 caps by mouth one time at bedtime for itching instructions in spanish 14)  Simvastatin 40 Mg Tabs (Simvastatin) .Marland Kitchen.. 1 by mouth one time daily spanish language instructions  Patient Instructions: 1)  Fue un placer verle hoy.  Estamos sacandole [employer], y Manufacturing systems engineer en contacto con los [place].  2)  Estamos cambiando la dosis de la simvastatina a $RemoveBefore'40mg'ndntBXHalfzxG$  por dia.   3)  Quiero verle en 3 meses  4)  FOLLOWUP APPT WITH DR Lindell Noe IN 3 MONTHS Prescriptions: TRAMADOL HCL 50 MG TABS (TRAMADOL HCL) 1-2 tabs by mouth three times a  day as needed severe pain  #80 x 2   Entered and Authorized by:   Dalbert Mayotte MD   Signed by:   Dalbert Mayotte MD on 02/08/2010   Method used:   Electronically to        [facility]. [address] [phone]* (retail)       [address], [place]  [address]       Ph: [phone] or [phone]       Fax: [phone]   RxID:   [id number] SIMVASTATIN 40 MG TABS (SIMVASTATIN) 1 by mouth one time daily Spanish language instructions  #30 x 11   Entered and Authorized by:   Dalbert Mayotte MD   Signed by:   Dalbert Mayotte MD on 02/08/2010   Method used:   Electronically to        [facility]. [address] [phone]* (retail)       [address]       [place], [place]  [address]       Ph: [phone] or [phone]       Fax: [phone]   RxID:   [id number]

## 2010-09-26 NOTE — Assessment & Plan Note (Signed)
Summary: F/U  [place]   Vital Signs:  Patient profile:   65 year old male Height:      63 inches Weight:      142 pounds BMI:     25.25 Temp:     98.4 degrees F oral Pulse rate:   61 / minute BP sitting:   170 / 88  (left arm) Cuff size:   regular  Vitals Entered By: Schuyler Amor CMA (August 12, 2010 2:07 PM) CC: F/U Is Patient Diabetic? No Pain Assessment Patient in pain? no        Primary Care Provider:  Candelaria Celeste MD  CC:  F/U.  History of Present Illness: 1) HTN: BP continues to be elevated. Goal 140/90. Reports that he has eliminated salt from his diet. Denies chest pain, dyspnea, LE edema, headache, vision change, focal neurological signs. Reports that he is taking all of his medications as prescribed. Has had erectile dysfunction with beta blockers and norvasc; history of cough with ACE-I; has a history of mild hypercalcemia (not worked up) and his primary physician would like to avoid HCTZ as a result.    Med rec as per prior meds.   Habits & Providers  Alcohol-Tobacco-Diet     Tobacco Status: quit  Medications Prior to Update: 1)  Nitroquick 0.4 Mg Subl (Nitroglycerin) .... Place 1 Tablet Under Tongue As Directed 2)  Seroquel 200 Mg  Tabs (Quetiapine Fumarate) .Marland Kitchen.. 1 Tab Qhs 3)  Ecotrin 325 Mg  Tbec (Aspirin) .... Take 1 Tab Daily 4)  Fish Oil Concentrate 1000 Mg  Caps (Omega-3 Fatty Acids) .... One Tab By Mouth Four  Times A Day With Meals (Okay To Use Otc of Similar Dose Ie 1200) 5)  Fluoxetine Hcl 40 Mg Caps (Fluoxetine Hcl) .... Take One Tablet Daily At Bedtime 6)  Tylenol Ex St Arthritis Pain 500 Mg  Tabs (Acetaminophen) .... 2 Tabs By Mouth Tid 7)  Cardizem Cd 240 Mg Xr24h-Cap (Diltiazem Hcl Coated Beads) .... Take One Tab Two Times A Day 8)  Nexium 40 Mg Cpdr (Esomeprazole Magnesium) .... Sig: Take 1 By Mouth Daily 9)  Tricor 145 Mg Tabs (Fenofibrate) .... Sig: Take 1 Tab By Mouth 1 Time Daily Instructions in Spanish 10)  Simvastatin 40 Mg Tabs  (Simvastatin) .Marland Kitchen.. 1 By Mouth One Time Daily Spanish Language Instructions 11)  Zyrtec Allergy 10 Mg Tbdp (Cetirizine Hcl) .Marland Kitchen.. 1 Tab By Mouth Daily For Itching 12)  Sarna 0.5-0.5 % Lotn (Camphor-Menthol) .... Apply To Skin Two Times A Day 13)  Hydralazine Hcl 10 Mg Tabs (Hydralazine Hcl) .... Take One Tab Three Times A Day  Allergies (verified): 1)  ! Ace Inhibitors 2)  ! Beta Blockers 3)  ! Norvasc  Physical Exam  General:  normal weight, well appearing, alert, pleasant, NAD    Neck:  no JVD  Lungs:  Normal respiratory effort, chest expands symmetrically. Lungs are clear to auscultation, no crackles or wheezes. Heart:  RRR, no murmur Abdomen:  no abdominal bruit  Pulses:  2+ radials  Extremities:  no edema    Impression & Recommendations:  Problem # 1:  HYPERTENSION, BENIGN SYSTEMIC (ICD-401.1) Assessment Deteriorated  Not at goal. Will increase his hydralaizine to 25 mg by mouth three times a day (from 10 mg by mouth three times a day). Will avoid beta-blockers for now given history of erectile dysfunction with this. Will avoid HCTZ for now given hypercalcemia. Consider add Lasix in future (though not having any signs of volume overload  at this time. Also consider clonidine. Follow up in 2 weeks for BP recheck.   His updated medication list for this problem includes:    Cardizem Cd 240 Mg Xr24h-cap (Diltiazem hcl coated beads) .Marland Kitchen... Take one tab two times a day    Hydralazine Hcl 25 Mg Tabs (Hydralazine hcl) .Marland Kitchen... Take one tab three times a day  Orders: [facility]- Est Level  3 (99213)  BP today: 170/88 Prior BP: 167/93 (07/15/2010)  Prior 10 Yr Risk Heart Disease: 14 % (06/19/2008)  Labs Reviewed: K+: 3.9 (07/01/2010) Creat: : 1.49 (07/01/2010)   Chol: 229 (05/17/2009)   HDL: 28 (05/17/2009)   LDL: See Comment mg/dL (05/17/2009)   TG: 437 (09/05/2009)  Complete Medication List: 1)  Nitroquick 0.4 Mg Subl (Nitroglycerin) .... Place 1 tablet under tongue as directed 2)   Seroquel 200 Mg Tabs (Quetiapine fumarate) .Marland Kitchen.. 1 tab qhs 3)  Ecotrin 325 Mg Tbec (Aspirin) .... Take 1 tab daily 4)  Fish Oil Concentrate 1000 Mg Caps (Omega-3 fatty acids) .... One tab by mouth four  times a day with meals (okay to use otc of similar dose ie 1200) 5)  Fluoxetine Hcl 40 Mg Caps (Fluoxetine hcl) .... Take one tablet daily at bedtime 6)  Tylenol Ex St Arthritis Pain 500 Mg Tabs (Acetaminophen) .... 2 tabs by mouth tid 7)  Cardizem Cd 240 Mg Xr24h-cap (Diltiazem hcl coated beads) .... Take one tab two times a day 8)  Nexium 40 Mg Cpdr (Esomeprazole magnesium) .... Sig: take 1 by mouth daily 9)  Tricor 145 Mg Tabs (Fenofibrate) .... Sig: take 1 tab by mouth 1 time daily instructions in spanish 10)  Simvastatin 40 Mg Tabs (Simvastatin) .Marland Kitchen.. 1 by mouth one time daily spanish language instructions 11)  Zyrtec Allergy 10 Mg Tbdp (Cetirizine hcl) .Marland Kitchen.. 1 tab by mouth daily for itching 12)  Sarna 0.5-0.5 % Lotn (Camphor-menthol) .... Apply to skin two times a day 13)  Hydralazine Hcl 25 Mg Tabs (Hydralazine hcl) .... Take one tab three times a day  Patient Instructions: 1)  Recheck Jan 3rd for blood pressure follow-up  Prescriptions: HYDRALAZINE HCL 25 MG TABS (HYDRALAZINE HCL) take one tab three times a day  #90 x 11   Entered and Authorized by:   Candelaria Celeste MD   Signed by:   Candelaria Celeste MD on 08/12/2010   Method used:   Print then Give to Patient   RxID:   [id number] NEXIUM 40 MG CPDR (ESOMEPRAZOLE MAGNESIUM) SIG: Take 1 by mouth daily  #90 x 3   Entered and Authorized by:   Candelaria Celeste MD   Signed by:   Candelaria Celeste MD on 08/12/2010   Method used:   Print then Give to Patient   RxID:   [id number]    Orders Added: 1)  [facility]- Est Level  3 [10272]

## 2010-10-02 ENCOUNTER — Other Ambulatory Visit: Payer: Self-pay | Admitting: Family Medicine

## 2010-10-02 DIAGNOSIS — E785 Hyperlipidemia, unspecified: Secondary | ICD-10-CM

## 2010-10-02 NOTE — Telephone Encounter (Signed)
Pt is in [place] for 2 months. Wants refill sent to pharmacy listed for Tricor

## 2010-10-02 NOTE — Telephone Encounter (Signed)
Rx was sent on Sally's request

## 2010-12-23 ENCOUNTER — Encounter: Payer: Self-pay | Admitting: Family Medicine

## 2010-12-23 ENCOUNTER — Ambulatory Visit (INDEPENDENT_AMBULATORY_CARE_PROVIDER_SITE_OTHER): Payer: Medicare Other | Admitting: Family Medicine

## 2010-12-23 VITALS — BP 160/90 | HR 60 | Temp 97.7°F | Ht 62.5 in | Wt 143.6 lb

## 2010-12-23 DIAGNOSIS — E785 Hyperlipidemia, unspecified: Secondary | ICD-10-CM

## 2010-12-23 DIAGNOSIS — I2510 Atherosclerotic heart disease of native coronary artery without angina pectoris: Secondary | ICD-10-CM

## 2010-12-23 DIAGNOSIS — C9110 Chronic lymphocytic leukemia of B-cell type not having achieved remission: Secondary | ICD-10-CM

## 2010-12-23 DIAGNOSIS — I10 Essential (primary) hypertension: Secondary | ICD-10-CM

## 2010-12-23 MED ORDER — SIMVASTATIN 40 MG PO TABS: 40.0000 mg | ORAL_TABLET | Freq: Every day | ORAL | Status: DC

## 2010-12-23 MED ORDER — FENOFIBRATE 145 MG PO TABS: 145.0000 mg | ORAL_TABLET | Freq: Every day | ORAL | Status: DC

## 2010-12-23 MED ORDER — NITROGLYCERIN 0.4 MG SL SUBL: 0.4000 mg | SUBLINGUAL_TABLET | SUBLINGUAL | Status: DC

## 2010-12-23 NOTE — Assessment & Plan Note (Signed)
No recent chest pain. Advised him of the importance to take aspirin for his stent, especially since he is off Plavix.

## 2010-12-23 NOTE — Assessment & Plan Note (Signed)
Poorly controlled off Hydralazine. He is willing to restart it, but I would like to see his lab results first.

## 2010-12-23 NOTE — Progress Notes (Signed)
  Subjective:    Patient ID: Nathan Richards, male    DOB: 05/14/1946, 65 y.o.   MRN: [id number]  HPIHector has been in [place] visiting his daughter until 4 weeks ago. He left his Simvastatin and Tricor there.   He has been taking his Diltiazem, but stopped the Hydralazine several weeks ago not wanting to take so many pills. He had been taken off HCTZ in the past due to hypercalcemia and off beta blocker due to impotence.   Sexual function is now good with his wife with whom he has reconciled. Her son continues to live with them. The son that they had together lives in [place].  His generalized itching resolved. He had a severe case of poison ivy in January while in [place].   His R ear has been hurting. Worse sometimes when chewing which is abnormal due to a lose R upper bicuspid. That tooth had been infected. It was treated by a dentist, but remains loose.   He continues taking his Fluoxetine and Seroquel and has an appt with his psychiatrist sometime in May.   Review of Systems  Constitutional: Negative for unexpected weight change.  HENT: Positive for dental problem. Negative for congestion and rhinorrhea.   Respiratory: Negative for cough and shortness of breath.   Cardiovascular: Negative for chest pain.  Musculoskeletal: Negative for arthralgias and gait problem.  Hematological: Negative for adenopathy. Does not bruise/bleed easily.  Psychiatric/Behavioral: Negative for dysphoric mood. The patient is not nervous/anxious.        Objective:   Physical Exam  Constitutional: He appears well-developed and well-nourished.  HENT:  Right Ear: Tympanic membrane, external ear and ear canal normal. No drainage.  Left Ear: Hearing, tympanic membrane, external ear and ear canal normal.  Nose: Rhinorrhea present.  Mouth/Throat: Mucous membranes are normal. He has dentures. Abnormal dentition. No dental abscesses. No oropharyngeal exudate or posterior oropharyngeal edema.       Absent  molars. R upper bicuspid is loose, but not tender.   Mildly asymmetric jaw motion. Not tender over TMJ's and no clunk felt  Neck:       4 mm node R medial supraclavicular space  Cardiovascular: Normal rate and regular rhythm.   Pulmonary/Chest: Effort normal and breath sounds normal.  Abdominal: Soft. He exhibits no distension and no mass. There is no tenderness. There is no guarding.       No palpable splenomegaly.  Lymphadenopathy:    He has cervical adenopathy.  Skin: No rash noted. No erythema.  Psychiatric: He has a normal mood and affect. His behavior is normal. Thought content normal.          Assessment & Plan:

## 2010-12-23 NOTE — Patient Instructions (Addendum)
Return fasting in the AM for lab tests.   Voy a llamarle con los resultados en pocos dias para decidir cual medicamento debemos usar para bajar la presion arterial. Evite sal. Hoy la presion esta 160/90 y hay peligro de Market researcher los rinones.   Si no hay problemas con la sangre puede tomar Ibuprofen para dolor en la articulacion cerca del oido derecho.   Regrese en un mes para chequeo de presion arterial.

## 2010-12-24 ENCOUNTER — Other Ambulatory Visit: Payer: Medicare Other

## 2010-12-24 DIAGNOSIS — C9110 Chronic lymphocytic leukemia of B-cell type not having achieved remission: Secondary | ICD-10-CM

## 2010-12-24 DIAGNOSIS — E785 Hyperlipidemia, unspecified: Secondary | ICD-10-CM

## 2010-12-24 DIAGNOSIS — I10 Essential (primary) hypertension: Secondary | ICD-10-CM

## 2010-12-24 LAB — LIPID PANEL
HDL: 45 mg/dL (ref >39)
LDL Cholesterol: 67 mg/dL (ref 0–99)
Total CHOL/HDL Ratio: 3 Ratio
Triglycerides: 112 mg/dL (ref <150)
VLDL: 22 mg/dL (ref 0–40)

## 2010-12-24 LAB — COMPREHENSIVE METABOLIC PANEL
Alkaline Phosphatase: 62 U/L (ref 39–117)
BUN: 20 mg/dL (ref 6–23)
Glucose, Bld: 81 mg/dL (ref 70–99)
Total Bilirubin: 0.6 mg/dL (ref 0.3–1.2)

## 2010-12-24 NOTE — Progress Notes (Signed)
Cmp,flp and cbc with diff done today Nathan Richards

## 2010-12-25 ENCOUNTER — Telehealth: Payer: Self-pay | Admitting: Family Medicine

## 2010-12-25 DIAGNOSIS — I10 Essential (primary) hypertension: Secondary | ICD-10-CM

## 2010-12-25 DIAGNOSIS — C9110 Chronic lymphocytic leukemia of B-cell type not having achieved remission: Secondary | ICD-10-CM

## 2010-12-25 LAB — CBC WITH DIFFERENTIAL/PLATELET
Basophils Absolute: 0 10*3/uL (ref 0.0–0.1)
Basophils Relative: 0 % (ref 0–1)
MCHC: 34.2 g/dL (ref 30.0–36.0)
Monocytes Absolute: 0.4 10*3/uL (ref 0.1–1.0)
Neutro Abs: 2.6 10*3/uL (ref 1.7–7.7)
Neutrophils Relative %: 12 % — ABNORMAL LOW (ref 43–77)
Platelets: 165 10*3/uL (ref 150–400)
RDW: 14.3 % (ref 11.5–15.5)

## 2010-12-25 LAB — PATHOLOGIST SMEAR REVIEW

## 2010-12-25 NOTE — Assessment & Plan Note (Signed)
Back to normal

## 2010-12-25 NOTE — Assessment & Plan Note (Addendum)
WBC remains stable around 22.5 K

## 2010-12-25 NOTE — Telephone Encounter (Signed)
Labs reviewed over the phone with Sr Hammitt. He will restart Hydralazine

## 2010-12-25 NOTE — Assessment & Plan Note (Signed)
His CMET showed continued mild elevation of creatine (1.51) and normal LFT's. He has Hydralazine and agrees to restart it since it didn't cause impotence before.

## 2011-01-07 NOTE — Assessment & Plan Note (Signed)
[facility] OFFICE NOTE   NAME:Lallier, GENEVIEVE                          MRN:          [id number]  DATE:10/11/2007                            DOB:          [birth year]    PRIMARY CARE PHYSICIAN:  Wayne A. Walker Kehr, M.D.   REASON FOR VISIT:  Cardiology follow-up.   HISTORY OF PRESENT ILLNESS:  Mr. Zarcone comes in for a 39-month visit.  Overall he has been stable without any progressive chest pain.  He  describes an occasional atypical right-sided chest discomfort that lasts  for only a few seconds but nothing predictable or prolonged.  He is not  having to use any sublingual nitroglycerin.  His electrocardiogram today  shows sinus bradycardia with nonspecific ST-T wave changes.  He did have  follow-up blood work after his last visit, demonstrating an LDL of 39,  normal liver function tests.  He states that he has been undergoing some  evaluation for possible diabetes.  His last Myoview was in November  2006.   ALLERGIES:  NO KNOWN DRUG ALLERGIES.   MEDICATIONS:  1. Amlodipine 5 mg p.o. daily.  2. Aspirin 81 mg p.o. daily.  3. Plavix 75 mg p.o. daily.  4. Simvastatin 80 mg p.o. nightly.  5. Seroquel 20 mg p.o. nightly.  6. Fluoxetine 20 mg p.o. b.i.d.  7. Hyzaar 10/12.5 mg p.o. daily.  8. Clonidine 0.1 mg p.o. b.i.d.  9. Omega III supplements.   REVIEW OF SYSTEMS:  As described in history of present illness.  Otherwise negative.   PHYSICAL EXAMINATION:  VITAL SIGNS:  Blood pressure 156/88, heart rate  is 59, weight 153 pounds.  GENERAL APPEARANCE:  The patient is comfortable and in no acute  distress.  HEENT:  Conjunctivae and lids normal.  Oropharynx is clear.  NECK:  Supple.  No elevated jugular venous pressure.  No loud bruits.  No thyromegaly is noted.  LUNGS:  Clear without labored breathing at rest.  CARDIAC:  Exam reveals a regular rate and rhythm, no loud systolic  murmur or S3 gallop.  ABDOMEN:  Soft,  nontender.  No bruits.  EXTREMITIES:  No pitting edema.  Distal pulses are 2+.  SKIN:  Warm and dry.  MUSCULOSKELETAL:  No kyphosis noted.  NEUROPSYCHIATRIC:  The patient alert and oriented x3.  Affect is  appropriate.   IMPRESSION/RECOMMENDATIONS:  1. Coronary disease status post drug-eluting stent placement to the      proximal and mid left anterior descending as well as angioplasty to      the first diagonal branch in January 2005.  Last Myoview was in      November 2006.  At this point he is overall symptomatically stable      on medical therapy.  He will be approaching 3 years from his last      ischemic assessment prior to his next visit in 6 months and we will      therefore schedule an adenosine Myoview and medical therapy at that      time.  I will then plan to see  him back and review the results.  2. Continue to follow up with Dr. Walker Kehr for general risk factor      modification.  LDL was well controlled.     Satira Sark, MD  Electronically Signed    SGM/MedQ  DD: 10/11/2007  DT: 10/12/2007  Job #: [id number]   cc:   [place] A. Walker Kehr, M.D.

## 2011-01-07 NOTE — Assessment & Plan Note (Signed)
[facility] OFFICE NOTE   NAME:Nathan Richards, Nathan Richards                          MRN:          [id number]  DATE:03/03/2007                            DOB:          09-Jun-1946    REASON FOR VISIT:  Cardiac followup.   HISTORY OF PRESENT ILLNESS:  Nathan Richards comes in for a routine visit.  He  is not having any significant chest pain at this point.  He seems to be  tolerating simvastatin well.  He is due for followup lipids.  Today's  electrocardiogram shows sinus rhythm with nonspecific ST-T wave changes,  most prominent in the lateral leads, although without marked change from  his previous tracing.   ALLERGIES:  No known drug allergies.   PRESENT MEDICATIONS:  1. Omega-3 supplements 2000 mg p.o. b.i.d.  2. Hyzaar 10/12.5 mg p.o. daily.  3. Clonidine 0.1 mg p.o. daily.  4. Fluoxetine 20 mg p.o. b.i.d.  5. Seroquel 200 mg p.o. nightly.  6. Simvastatin 80 mg p.o. nightly.  7. Plavix 75 mg p.o. daily.  8. Aspirin at 325 mg p.o. daily.  9. Sublingual nitroglycerin 0.4 mg p.r.n.   REVIEW OF SYSTEMS:  As described in the history of present illness.  He  has had some swelling and discomfort around his left olecranon and  forearm.  He thinks he may have had a bug bite recently.  He reports  that this seems to be getting better without specific intervention.   PHYSICAL EXAMINATION:  Weight 148 pounds, blood pressure 142/80, heart  rate is 61.  The patient is comfortable and in no acute distress.  Examination of the neck reveals no elevated jugular venous pressure or  loud bruits.  No thyromegaly is noted.  Lungs are clear without labored breathing at rest.  CARDIAC EXAM:  Reveals a regular rate and rhythm, no loud murmur or  gallop.  Left olecranon area has erythema surrounding, mild tenderness, no  obvious punctate area or exudate.  Distal pulses are 2+.   IMPRESSION/RECOMMENDATIONS:  1. Coronary artery disease, status  post previous drug-eluting stent      placement at the proximal and mid left anterior descending with      angioplasty to the first diagonal branch in January of 2005.      Followup Myoview in 2006 showed no active ischemia, and the patient      is symptomatically stable.  Will plan to continue medical therapy,      and I will see him back over the next 6 months.  2. Hyperlipidemia:  Now on simvastatin.  Will plan a followup lipid      profile with liver function tests, aiming for a goal LDL of 70.     Nathan Sark, MD  Electronically Signed    SGM/MedQ  DD: 03/03/2007  DT: 03/03/2007  Job #: [id number]   cc:   Nathan Richards, M.D.

## 2011-01-10 NOTE — Discharge Summary (Signed)
NAME:  Nathan Richards, Nathan Richards                             ACCOUNT NO.:  [id number]   MEDICAL RECORD NO.:  [id number]                   PATIENT TYPE:  OIB   LOCATION:  6532                                 FACILITY:  [facility]   PHYSICIAN:  Rosaria Ferries, P.A. [facility]            DATE OF BIRTH:  09-24-1945   DATE OF ADMISSION:  01/30/2004  DATE OF DISCHARGE:  01/31/2004                                 DISCHARGE SUMMARY   PROCEDURES:  1. Cardiac catheterization.  2. Coronary arteriogram.  3. Left ventriculogram.  4. PTCA and stent to one vessel and PTCA to a second vessel.   HOSPITAL COURSE:  Mr. Cadieux is a 65 year old male with a history of chest  pain.  He had an exercise treadmill test that had some ST depression in the  lateral leads.  He was referred for cardiology consultation and after  evaluation, Dr. Vicenta Aly scheduled him for a catheterization.  He came in  for this on 01/30/04.   The cardiac catheterization showed a 95% LAD in the region of the first  diagonal and a first diagonal with a 95% proximal stenosis.  He treated the  LAD with a TAXUS stent reducing that stenosis to zero, and the diagonal was  treated with PTCA, reducing that stenosis to 25%.  His EF was 65%, and he  had no other critical disease, although there was a 40% stenosis in the RCA  and 50% proximal stenosis in the LAD.  Medical therapy was recommended for  these lesions.   The next day, his enzymes were negative for MI, and he had no significant  problems with his cath site.  His other laboratory values were within normal  limits.  His chest x-ray showed probable COPD but no acute disease.  Mr.  Oran was considered stable for discharge on June 8/05 pending his  evaluation.  Of note, a lipid profile has been performed but is pending at  the time of dictation.   DISCHARGE CONDITION:  Improved.   DISCHARGE DIAGNOSES:  1. Unstable anginal pain, status post PTCA and TAXUS stent to the LAD with     PTCA to the first  diagonal this admission.  2. Residual coronary artery disease of 50% in the LAD, 40% in the RCA, and     20% in the circumflex with preserved left ventricular function by     catheterization this admission.  3. Hyperlipidemia.  4. Hypertension.  5. Schizophrenia.  6. Family history of coronary artery disease.  7. Remote history of tobacco use and alcohol abuse.   DISCHARGE INSTRUCTIONS:  His activity level is to include no strenuous  activities for a week.  He is to stick to a low-fat diet.  He is to call the  office for problems with the catheterization site.  He is to follow up with  Dr. Alford Highland as scheduled.  He has an appointment with the PA for  Dr.  Vicenta Aly on June 22 at 11 a.m.   DISCHARGE MEDICATIONS:  1. Aspirin 325 mg daily.  2. Toprol-XL 25 mg daily.  3. HCTZ 25 mg daily.  4. Seroquel 50 mg daily.  5. Prozac 80 mg daily.  6. Lipitor 20 mg daily.  7. Plavix 75 mg daily.  8. Nitroglycerin p.r.n.  9. Lisinopril 10 mg daily.                                                Rosaria Ferries, P.A. [facility]    RB/MEDQ  D:  01/31/2004  T:  02/01/2004  Job:  [id number]   cc:   Collier Flowers. Alford Highland, M.D.  Fam. Med - Resident - [place], [place] [address]  Fax: [phone]

## 2011-01-10 NOTE — Cardiovascular Report (Signed)
NAME:  Nathan Richards, Nathan Richards                             ACCOUNT NO.:  [id number]   MEDICAL RECORD NO.:  [id number]                   PATIENT TYPE:  OIB   LOCATION:  2376                                 FACILITY:  [place]   PHYSICIAN:  Junious Silk, M.D. [facility]         DATE OF BIRTH:  29-Aug-1945   DATE OF PROCEDURE:  01/30/2004  DATE OF DISCHARGE:  01/31/2004                              CARDIAC CATHETERIZATION   PROCEDURES PERFORMED:  1. Left heart catheterization with coronary angiography and left     ventriculography.  2. Percutaneous transluminal coronary angioplasty with placement of a drug-     eluting stent in the proximal-to-mid left anterior descending artery.  3. Percutaneous transluminal coronary angioplasty of a first diagonal     branch.   INDICATION:  Nathan Richards is a 65 year old male with history of hyperlipidemia  and hypertension.  He presents with symptoms of progressive exertional  angina.  An exercise treadmill stress test performed in the [facility] at [facility] was positive for ST segment depression with exercise  and reproduction of chest pain.  We opted to proceed with cardiac  catheterization.   CATHETERIZATION PROCEDURAL NOTE:  A 6-French sheath was placed in the right  femoral artery.  Coronary angiography was performed via standard Judkins 6-  French catheters.  Left ventriculography was performed with an angled  pigtail catheter.  Contrast was Omnipaque.  There were no complications.   CATHETERIZATION RESULTS:   HEMODYNAMICS:  Left ventricular pressure 154/10, aortic pressure 152/82.  There was no aortic valve gradient.   LEFT VENTRICULOGRAM:  Wall motion is normal.  Ejection fraction is estimated  at greater-than-or-equal-to 65%.  There is trace mitral regurgitation.   CORONARY ARTERIOGRAPHY (CO-DOMINANT):  Left main is normal.   Left anterior descending artery has a long area of plaque extending from the  proximal to the mid LAD.  In the  proximal LAD, there is a 50% stenosis  followed by a 95% stenosis.  Following this, there is a tubular 70% stenosis  extending into the mid LAD.  The length of the plaque is approximately 30  mm.  The LAD gives rise to a normal-sized first diagonal branch which arises  from within the diseased segment of the proximal vessel.  There is a 95%  stenosis in the ostium of this first diagonal branch.  There is a small  diagonal branch arising from the mid LAD.   Left circumflex is a large co-dominant vessel.  There are minor luminal  irregularities in the mid circumflex.  The circumflex gives rise to a normal-  sized bifurcating first obtuse marginal and a large bifurcating second  obtuse marginal branch.  The distal circumflex gives rise to 2 small  posterolateral branches.   Right coronary artery is a small co-dominant vessel, ending as a small  posterior descending artery.  There is a tubular 40% stenosis in the  proximal right  coronary artery.   IMPRESSIONS:  1. Normal left ventricular systolic function.  2. One-vessel coronary artery disease characterized by complex critical     stenosis involving the proximal left anterior descending artery and the     first diagonal branch.   PLAN:  Percutaneous intervention to the LAD.   PERCUTANEOUS TRANSLUMINAL CORONARY ANGIOPLASTY PROCEDURAL NOTE:  Following  completion of the diagnostic catheterization, we proceeded with percutaneous  coronary intervention.  The preexisting 6-French sheath in the right femoral  artery was exchanged over a wire for a 7-French sheath.  Heparin and  Integrilin were administered per protocol.  We used a 7-French JL4 guiding  catheter.  An ASAHI soft coronary guidewire was advanced under fluoroscopic  guidance into the distal LAD.  We then advanced an ASAHI light wire into the  first diagonal branch.  We attempted to pass a 3.0 x 20.0-mm Quantum balloon  across the lesion in the proximal LAD, however, this balloon  would not cross  the lesion due to severity of stenosis.  We then successfully advanced a 2.5  x 15.0-mm Quantum balloon across the 95% stenosis in the LAD and performed 2  inflations to 12 and 14 atmospheres.  Following this, we advanced a 2.0 x  12.0-mm VOYAGER balloon into the ostium of the first diagonal branch and  performed a balloon inflation to 8 atmospheres.  Following this, we  positioned a 3.0 x 32.0-mm TAXUS drug-eluting stent  and crossed the entire  length of disease extending from the proximal-to-mid left anterior  descending artery.  We withdrew our ASAHI light guidewire from the diagonal  branch.  We then deployed this stent at 16 atmospheres.  Following this,  there was compromise of flow in the diagonal branch with TIMI-2 flow in the  first diagonal branch.  We re-advanced our ASAHI light wire successfully  through this side struts of the stent into the diagonal branch.  We then  went in with a 2.0 x 12.0-mm VOYAGER balloon and crossed the side struts of  the stent in the first diagonal branch and inflated this balloon to 8  atmospheres.  Following this, we went in with a 3.0 x 15.0-mm Quantum  balloon and positioned this balloon in the proximal portion of the stent and  inflated it to 12 atmospheres.  We then performed kissing balloon  angioplasty with the 2.0 x 12.0-mm VOYAGER balloon in the ostium of the  first diagonal branch and a 3.5 x 15.0-mm Quantum balloon within the stent  in the LAD.  The Quantum balloon was inflated to 10 atmospheres and the  VOYAGER balloon was inflated to 8 atmospheres.  Intermittent doses of  nitroglycerin were administered.  Final angiographic images were obtained  revealing patency of the LAD with 0% residual stenosis at the stent site and  TIMI-3 flow into the distal vessel.  The first diagonal was also patent with  approximately 25% residual stenosis and TIMI-3 flow.  At the conclusion of the procedure, a AngioSeal vascular closure  device was  placed in the right femoral artery with good hemostasis.   COMPLICATIONS:  None.   RESULTS:  Successful bifurcational percutaneous coronary intervention of the  proximal left anterior descending artery and the first diagonal branch.  A  50% followed by a 95% followed by a 70% stenosis in the left anterior  descending artery were reduced to 0% residual with TIMI-3 flow.  A 95%  stenosis in the ostium of the first diagonal branch was reduced to  25%  residual with TIMI-3 flow.   PLAN:  Integrilin will be continued for 18 hours.  It is recommended that  the patient be treated with Plavix for 12 months.                                               Junious Silk, M.D. [facility]    MWP/MEDQ  D:  01/30/2004  T:  02/01/2004  Job:  [id number]   cc:   Barth Kirks C. Alford Highland, M.D.  [address]  [place], [place] [address]  Fax: [facility]

## 2011-01-23 ENCOUNTER — Ambulatory Visit: Payer: Medicare Other

## 2011-01-23 ENCOUNTER — Ambulatory Visit: Payer: Medicare Other | Admitting: Family Medicine

## 2011-03-24 ENCOUNTER — Encounter: Payer: Self-pay | Admitting: Family Medicine

## 2011-03-24 ENCOUNTER — Ambulatory Visit (INDEPENDENT_AMBULATORY_CARE_PROVIDER_SITE_OTHER): Payer: Medicare Other | Admitting: Family Medicine

## 2011-03-24 VITALS — BP 138/84 | HR 64 | Wt 145.8 lb

## 2011-03-24 DIAGNOSIS — D2120 Benign neoplasm of connective and other soft tissue of unspecified lower limb, including hip: Secondary | ICD-10-CM

## 2011-03-24 DIAGNOSIS — I10 Essential (primary) hypertension: Secondary | ICD-10-CM

## 2011-03-24 DIAGNOSIS — M1990 Unspecified osteoarthritis, unspecified site: Secondary | ICD-10-CM

## 2011-03-24 DIAGNOSIS — I2510 Atherosclerotic heart disease of native coronary artery without angina pectoris: Secondary | ICD-10-CM

## 2011-03-24 DIAGNOSIS — C9110 Chronic lymphocytic leukemia of B-cell type not having achieved remission: Secondary | ICD-10-CM

## 2011-03-24 MED ORDER — TRAMADOL-ACETAMINOPHEN 37.5-325 MG PO TABS: 1.0000 | ORAL_TABLET | ORAL | Status: DC | PRN

## 2011-03-24 MED ORDER — TRAMADOL-ACETAMINOPHEN 37.5-325 MG PO TABS: 2.0000 | ORAL_TABLET | Freq: Four times a day (QID) | ORAL | Status: DC | PRN

## 2011-03-24 NOTE — Patient Instructions (Signed)
Return in 3 months  Regrese en 3 meses  Vamos a arreglar la cita con la ortopedista para el pie

## 2011-03-24 NOTE — Progress Notes (Signed)
  Subjective:    Patient ID: Nathan Richards, male    DOB: Jul 27, 1946, 65 y.o.   MRN: [id number]  [facility] generally feels well.  Does get low back and knee pains that don't respond to acetaminophen. He would like Tramadol which he has used before. He was informed of the possible interaction with Fluoxetine. He has never had seizures.   The nodule on the right medial foot hasn't gotten smaller and is painful where his shoe rubs it. He says there was no fluid when it was aspirated last year.   He saw his psychiatrist last month.  Review of Systems  Constitutional: Negative for fever and unexpected weight change.  Respiratory: Negative for apnea, shortness of breath and wheezing.   Cardiovascular: Negative for chest pain.       Objective:   Physical Exam  Constitutional: He appears well-developed and well-nourished.  Neck: No tracheal deviation present. No thyromegaly present.  Cardiovascular: Normal rate, regular rhythm and normal heart sounds.   Pulmonary/Chest: Effort normal and breath sounds normal.  Abdominal: Soft. He exhibits no mass. There is no tenderness.       No splenomegaly  Musculoskeletal: He exhibits no edema.       1 x 2 cm nodule anterior medial right midfoot firm nodule that doesn't move with tendon  Lymphadenopathy:    He has no cervical adenopathy.  Psychiatric: He has a normal mood and affect. His behavior is normal. Judgment and thought content normal.          Assessment & Plan:   No problem-specific assessment & plan notes found for this encounter.

## 2011-03-24 NOTE — Assessment & Plan Note (Signed)
No typical chest pain. Occasional chest pain at rest

## 2011-03-24 NOTE — Assessment & Plan Note (Signed)
Normalized last visit

## 2011-03-24 NOTE — Assessment & Plan Note (Signed)
No indication of adverse effect

## 2011-03-24 NOTE — Assessment & Plan Note (Signed)
Trial of low dose Ultracet.

## 2011-03-24 NOTE — Assessment & Plan Note (Signed)
Fairly well controlled

## 2011-04-30 ENCOUNTER — Other Ambulatory Visit: Payer: Self-pay | Admitting: Family Medicine

## 2011-04-30 DIAGNOSIS — M6749 Ganglion, multiple sites: Secondary | ICD-10-CM

## 2011-04-30 NOTE — Telephone Encounter (Signed)
Refill request

## 2011-04-30 NOTE — Telephone Encounter (Signed)
Called pharmacy and cancelled rx for tramadol.

## 2011-04-30 NOTE — Telephone Encounter (Signed)
Tramadol was replaced by Ultracet last visit

## 2011-04-30 NOTE — Assessment & Plan Note (Signed)
MRI confirmed that it was a cyst. He declined surgery by the orthopedist

## 2011-05-26 LAB — GLUCOSE, CAPILLARY: Glucose-Capillary: 142 — ABNORMAL HIGH

## 2011-05-28 ENCOUNTER — Telehealth (HOSPITAL_COMMUNITY): Payer: Self-pay | Admitting: Family Medicine

## 2011-05-28 NOTE — Telephone Encounter (Signed)
Pt called and stated doesn't like the orthopedic office service, pt is unhappy and will like to Dr. Walker Kehr change or referral to another orthopedic office. [place]

## 2011-06-05 ENCOUNTER — Other Ambulatory Visit: Payer: Self-pay | Admitting: Family Medicine

## 2011-06-05 NOTE — Telephone Encounter (Signed)
Refill request

## 2011-06-24 ENCOUNTER — Ambulatory Visit (INDEPENDENT_AMBULATORY_CARE_PROVIDER_SITE_OTHER): Payer: Medicare Other | Admitting: Family Medicine

## 2011-06-24 ENCOUNTER — Encounter: Payer: Self-pay | Admitting: Family Medicine

## 2011-06-24 VITALS — BP 170/90 | HR 62 | Ht 62.5 in | Wt 143.8 lb

## 2011-06-24 DIAGNOSIS — I10 Essential (primary) hypertension: Secondary | ICD-10-CM

## 2011-06-24 DIAGNOSIS — C9110 Chronic lymphocytic leukemia of B-cell type not having achieved remission: Secondary | ICD-10-CM

## 2011-06-24 DIAGNOSIS — M6749 Ganglion, multiple sites: Secondary | ICD-10-CM

## 2011-06-24 DIAGNOSIS — I2510 Atherosclerotic heart disease of native coronary artery without angina pectoris: Secondary | ICD-10-CM

## 2011-06-24 DIAGNOSIS — Z23 Encounter for immunization: Secondary | ICD-10-CM

## 2011-06-24 DIAGNOSIS — F1111 Opioid abuse, in remission: Secondary | ICD-10-CM

## 2011-06-24 LAB — BASIC METABOLIC PANEL
BUN: 14 mg/dL (ref 6–23)
Chloride: 106 mEq/L (ref 96–112)
Creat: 1.43 mg/dL — ABNORMAL HIGH (ref 0.50–1.35)

## 2011-06-24 MED ORDER — DILTIAZEM HCL ER COATED BEADS 240 MG PO CP24: 240.0000 mg | ORAL_CAPSULE | Freq: Two times a day (BID) | ORAL | Status: DC

## 2011-06-24 MED ORDER — HYDRALAZINE HCL 25 MG PO TABS: 25.0000 mg | ORAL_TABLET | Freq: Three times a day (TID) | ORAL | Status: DC

## 2011-06-24 NOTE — Patient Instructions (Addendum)
Favor de Art gallery manager a tomar hydralazine otra vez para control de hipertension  Regrese en 2-3 semanas para chequeo de presion y [place]. Mas temprano si hay sintomas de infeccion  Return in 2-3 weeks to see Dr Walker Kehr

## 2011-06-24 NOTE — Assessment & Plan Note (Signed)
Shotty adenopathy, will check CBC

## 2011-06-24 NOTE — Progress Notes (Signed)
  Subjective:    Patient ID: Nathan Richards, male    DOB: 1946-01-05, 65 y.o.   MRN: [id number]  HPI he continues to have discomfort in his right ankle where he has the cyst.   He does not have fever chills or weakness  He continues to live with his wife although they seldom speak  He has not noticed any swollen glands or lumps  He's been out of hydralazyne for a couple daysReview of Systems     Objective:   Physical Exam  Constitutional: He appears well-developed.       Shotty left axillary and left inguinal adenopathy  Cardiovascular: Normal rate and regular rhythm.   Pulmonary/Chest: Effort normal and breath sounds normal.  Abdominal: Soft. He exhibits no distension and no mass.       No splenomegaly  Musculoskeletal:       1x2 cm R medial midfoot mobile nodule. After sterile prep and anesthesia with cold spray, I attempted to drain it, but could not puncture the cyst wall and attempting was very painful, so I stopped. Steroid was not injected  Lymphadenopathy:    He has no cervical adenopathy.          Assessment & Plan:

## 2011-06-24 NOTE — Assessment & Plan Note (Signed)
Not well controlled off the Hydralazine. I sent it to the pharmacy, and he agreed to take it

## 2011-06-24 NOTE — Assessment & Plan Note (Signed)
Apparently thick walled. Surgery only option.

## 2011-06-24 NOTE — Assessment & Plan Note (Signed)
No recent chest pain

## 2011-06-25 ENCOUNTER — Encounter: Payer: Self-pay | Admitting: Family Medicine

## 2011-06-25 LAB — CBC WITH DIFFERENTIAL/PLATELET
Hemoglobin: 15.2 g/dL (ref 13.0–17.0)
Lymphocytes Relative: 83 % — ABNORMAL HIGH (ref 12–46)
Lymphs Abs: 23.9 10*3/uL — ABNORMAL HIGH (ref 0.7–4.0)
Monocytes Relative: 2 % — ABNORMAL LOW (ref 3–12)
Neutro Abs: 3.9 10*3/uL (ref 1.7–7.7)
Neutrophils Relative %: 14 % — ABNORMAL LOW (ref 43–77)
Platelets: 165 10*3/uL (ref 150–400)
RBC: 5.12 MIL/uL (ref 4.22–5.81)
WBC: 28.8 10*3/uL — ABNORMAL HIGH (ref 4.0–10.5)

## 2011-07-15 ENCOUNTER — Ambulatory Visit: Payer: Medicare Other | Admitting: Family Medicine

## 2012-01-26 ENCOUNTER — Other Ambulatory Visit: Payer: Self-pay | Admitting: *Deleted

## 2012-01-26 DIAGNOSIS — I10 Essential (primary) hypertension: Secondary | ICD-10-CM

## 2012-01-26 MED ORDER — SIMVASTATIN 40 MG PO TABS: 40.0000 mg | ORAL_TABLET | Freq: Every day | ORAL | Status: DC

## 2012-01-27 MED ORDER — HYDROXYZINE PAMOATE 25 MG PO CAPS: 25.0000 mg | ORAL_CAPSULE | Freq: Four times a day (QID) | ORAL | Status: DC | PRN

## 2012-02-25 ENCOUNTER — Other Ambulatory Visit: Payer: Self-pay | Admitting: Family Medicine

## 2012-03-01 ENCOUNTER — Encounter: Payer: Self-pay | Admitting: Family Medicine

## 2012-03-01 ENCOUNTER — Ambulatory Visit (INDEPENDENT_AMBULATORY_CARE_PROVIDER_SITE_OTHER): Payer: Medicare Other | Admitting: Family Medicine

## 2012-03-01 VITALS — BP 130/70 | HR 60 | Ht 62.5 in | Wt 144.4 lb

## 2012-03-01 DIAGNOSIS — F339 Major depressive disorder, recurrent, unspecified: Secondary | ICD-10-CM

## 2012-03-01 DIAGNOSIS — Z1211 Encounter for screening for malignant neoplasm of colon: Secondary | ICD-10-CM

## 2012-03-01 DIAGNOSIS — I10 Essential (primary) hypertension: Secondary | ICD-10-CM

## 2012-03-01 DIAGNOSIS — H11439 Conjunctival hyperemia, unspecified eye: Secondary | ICD-10-CM

## 2012-03-01 DIAGNOSIS — C9110 Chronic lymphocytic leukemia of B-cell type not having achieved remission: Secondary | ICD-10-CM

## 2012-03-01 DIAGNOSIS — E785 Hyperlipidemia, unspecified: Secondary | ICD-10-CM

## 2012-03-01 LAB — CBC WITH DIFFERENTIAL/PLATELET
Basophils Absolute: 0.1 10*3/uL (ref 0.0–0.1)
Basophils Relative: 0 % (ref 0–1)
Eosinophils Relative: 2 % (ref 0–5)
HCT: 43.8 % (ref 39.0–52.0)
MCHC: 34.2 g/dL (ref 30.0–36.0)
Monocytes Absolute: 0.4 10*3/uL (ref 0.1–1.0)
Neutro Abs: 4.2 10*3/uL (ref 1.7–7.7)
Platelets: 227 10*3/uL (ref 150–400)
RDW: 13.3 % (ref 11.5–15.5)

## 2012-03-01 MED ORDER — SIMVASTATIN 40 MG PO TABS: 40.0000 mg | ORAL_TABLET | Freq: Every day | ORAL | Status: DC

## 2012-03-01 MED ORDER — DILTIAZEM HCL ER COATED BEADS 240 MG PO CP24: 240.0000 mg | ORAL_CAPSULE | Freq: Two times a day (BID) | ORAL | Status: DC

## 2012-03-01 MED ORDER — FENOFIBRATE 145 MG PO TABS: 145.0000 mg | ORAL_TABLET | Freq: Every day | ORAL | Status: DC

## 2012-03-01 NOTE — Assessment & Plan Note (Signed)
A: stable. No suicidal ideation. P: continue Prozac as prescribed.

## 2012-03-01 NOTE — Patient Instructions (Addendum)
Yves Dill por venir hoy.  1. Tome tu medicians. .  2. Tarjetas fecales x 3. Envielas a Air traffic controller.   3. Vienes aqui en 6 meses por hypertension.   Dr. Adrian Blackwater

## 2012-03-01 NOTE — Assessment & Plan Note (Signed)
Asymptomatic. Check CBC with diff.

## 2012-03-01 NOTE — Assessment & Plan Note (Signed)
A: intermittent problem. No evidence of bacterial or viral infection. No evidence of allergic reaction.  P:  -supportive care with OTC eye drops. -instructed patient to return if he develops signs or symptoms of infection including discharge/pus, diffuse redness and worsening pain.

## 2012-03-01 NOTE — Progress Notes (Signed)
Interpreter Lesle Chris for Dr Adrian Blackwater

## 2012-03-01 NOTE — Progress Notes (Signed)
Subjective:     Patient ID: Nathan Richards, male   DOB: 1946-01-17, 66 y.o.   MRN: [id number]  HPI Nathan Richards is a 66 yo M who presents for f/u to discuss the following: 1. HTN: taking medications. Does not check BP at home. Denies CP and SOB. Admits to LE edema after walking/standing for an extended time.   2. Depression: taking prozac. Denies suicidial or homicidal ideation. Denies depressed mood.   3. CLL: has not followed up with his hematologist since the last office visit. He denies fatigue, bleeding or weight loss.   4. L eye redness in pain: this is a recurrent problem that sometimes occurs in his R eye as well. He reports feeling a gritty/sandy feeling in his eyes and blurred vision. He denies sensitivity to light, eye discharge, fever and tearing sensation.   Health maintenance: never had colonoscopy. Patient prefers to have screening via stool cards.   Soc Hx: updated, patient now lives alone and is going through a divorce from his wife.   Review of Systems As per HPI    Objective:   Physical Exam BP 130/70  Pulse 60  Ht 5' 2.5" (1.588 m)  Wt 144 lb 6.4 oz (65.499 kg)  BMI 25.99 kg/m2 General appearance: alert, cooperative and no distress Eyes: L conjunctivae with lateral injection with limbic sparing. Ptyergium on medial cornea border. corneas clear. PERRL, EOM's intact. Fundi benign. Fluorescein stain negative.  Ears: normal TM's and external ear canals both ears Throat: lips, mucosa, and tongue normal; teeth and gums normal Neck: no adenopathy, no carotid bruit, no JVD, supple, symmetrical, trachea midline and thyroid not enlarged, symmetric, no tenderness/mass/nodules Lungs: clear to auscultation bilaterally Heart: regular rate and rhythm, S1, S2 normal, no murmur, click, rub or gallop Abdomen: soft, non-tender; bowel sounds normal; no masses,  no organomegaly Extremities: edema trace bilateral LE edema.  Neurologic: Grossly normal    Assessment and Plan:

## 2012-03-01 NOTE — Assessment & Plan Note (Signed)
A: well controlled. Meds: compliant. P:  -check dLDL and CMP. -continue current regimen.

## 2012-03-02 LAB — COMPREHENSIVE METABOLIC PANEL
ALT: 16 U/L (ref 0–53)
AST: 22 U/L (ref 0–37)
CO2: 24 mEq/L (ref 19–32)
Calcium: 10.4 mg/dL (ref 8.4–10.5)
Chloride: 107 mEq/L (ref 96–112)
Creat: 1.64 mg/dL — ABNORMAL HIGH (ref 0.50–1.35)
Potassium: 3.9 mEq/L (ref 3.5–5.3)
Sodium: 140 mEq/L (ref 135–145)
Total Protein: 7 g/dL (ref 6.0–8.3)

## 2012-03-02 LAB — LDL CHOLESTEROL, DIRECT: Direct LDL: 44 mg/dL

## 2012-03-02 LAB — PATHOLOGIST SMEAR REVIEW

## 2012-03-22 ENCOUNTER — Telehealth: Payer: Self-pay | Admitting: Family Medicine

## 2012-03-22 NOTE — Telephone Encounter (Signed)
Message copied by Boykin Nearing on Mon Mar 22, 2012  1:34 PM ------      Message from: Martinique, SARAH T      Created: Tue Mar 02, 2012  9:23 AM                   ----- Message -----         From: [facility] Interface         Sent: 03/01/2012  11:46 PM           To: Sarah T Martinique, MD

## 2012-03-22 NOTE — Telephone Encounter (Signed)
Please call patient. Labs stable. No medication changes at this time.

## 2012-04-06 LAB — HEMOCCULT GUIAC POC 1CARD (OFFICE)

## 2012-04-06 NOTE — Addendum Note (Signed)
Addended by: Martinique, Naysha Sholl on: 04/06/2012 05:00 PM   Modules accepted: Orders

## 2012-04-07 ENCOUNTER — Other Ambulatory Visit: Payer: Self-pay | Admitting: *Deleted

## 2012-04-07 MED ORDER — ESOMEPRAZOLE MAGNESIUM 40 MG PO CPDR: 40.0000 mg | DELAYED_RELEASE_CAPSULE | Freq: Every day | ORAL | Status: DC

## 2012-04-09 ENCOUNTER — Encounter: Payer: Self-pay | Admitting: Family Medicine

## 2012-05-10 ENCOUNTER — Telehealth: Payer: Self-pay | Admitting: Family Medicine

## 2012-05-10 NOTE — Telephone Encounter (Signed)
Patient requesting handicap placard form be completed and a call back when ready for pickup

## 2012-05-12 ENCOUNTER — Telehealth: Payer: Self-pay | Admitting: Family Medicine

## 2012-05-12 NOTE — Telephone Encounter (Signed)
Handicap form for Drivers Plate was signed for the patient to pick-up based on his knee arthrititis

## 2012-05-12 NOTE — Telephone Encounter (Signed)
Signed and given to the patient. He reported that his leg gave out the other day, so I recommended that he always use a can.

## 2012-05-25 ENCOUNTER — Other Ambulatory Visit: Payer: Self-pay | Admitting: Family Medicine

## 2012-06-21 ENCOUNTER — Telehealth: Payer: Self-pay | Admitting: Family Medicine

## 2012-06-21 NOTE — Telephone Encounter (Signed)
Labs done by [facility] were sent to [place]. His WBC is improved to 21 K, but his creatinine has increased a little to 1.68. TSH and A1c normal. Letter sent to them regarding his chronic conditions with request for their assessment of his psychiatric illness.

## 2012-06-23 ENCOUNTER — Encounter: Payer: Self-pay | Admitting: *Deleted

## 2012-06-24 ENCOUNTER — Other Ambulatory Visit: Payer: Self-pay | Admitting: Family Medicine

## 2012-07-12 ENCOUNTER — Telehealth: Payer: Self-pay | Admitting: Family Medicine

## 2012-07-12 NOTE — Telephone Encounter (Signed)
Form sent back to [facility] pharmacy accepting change to Simvastatin Rx for 90 tablets.

## 2012-07-26 ENCOUNTER — Other Ambulatory Visit: Payer: Self-pay | Admitting: Family Medicine

## 2012-08-22 ENCOUNTER — Other Ambulatory Visit: Payer: Self-pay | Admitting: Family Medicine

## 2012-09-18 ENCOUNTER — Other Ambulatory Visit: Payer: Self-pay | Admitting: Family Medicine

## 2012-09-20 NOTE — Telephone Encounter (Signed)
No further refills until he's had an office visit

## 2012-09-20 NOTE — Telephone Encounter (Signed)
This medication should be prescribed by his psychiatrist

## 2012-10-12 ENCOUNTER — Ambulatory Visit (INDEPENDENT_AMBULATORY_CARE_PROVIDER_SITE_OTHER): Payer: Medicare Other | Admitting: Family Medicine

## 2012-10-12 ENCOUNTER — Encounter: Payer: Self-pay | Admitting: Family Medicine

## 2012-10-12 VITALS — BP 158/98 | HR 73 | Temp 98.1°F | Wt 144.8 lb

## 2012-10-12 DIAGNOSIS — I10 Essential (primary) hypertension: Secondary | ICD-10-CM

## 2012-10-12 DIAGNOSIS — F209 Schizophrenia, unspecified: Secondary | ICD-10-CM

## 2012-10-12 DIAGNOSIS — E785 Hyperlipidemia, unspecified: Secondary | ICD-10-CM

## 2012-10-12 DIAGNOSIS — C9110 Chronic lymphocytic leukemia of B-cell type not having achieved remission: Secondary | ICD-10-CM

## 2012-10-12 LAB — CBC
Hemoglobin: 15.5 g/dL (ref 13.0–17.0)
MCH: 29.5 pg (ref 26.0–34.0)
Platelets: 167 10*3/uL (ref 150–400)
RBC: 5.26 MIL/uL (ref 4.22–5.81)
WBC: 29 10*3/uL — ABNORMAL HIGH (ref 4.0–10.5)

## 2012-10-12 MED ORDER — SIMVASTATIN 20 MG PO TABS: ORAL_TABLET | ORAL | Status: DC

## 2012-10-12 MED ORDER — HYDRALAZINE HCL 50 MG PO TABS: ORAL_TABLET | ORAL | Status: DC

## 2012-10-12 NOTE — Assessment & Plan Note (Signed)
We'll see how he does on decreased medications.

## 2012-10-12 NOTE — Assessment & Plan Note (Addendum)
Not well controlled, though improved on recheck. Hydralazine increased to 50 mg three times daily  Check CMET

## 2012-10-12 NOTE — Assessment & Plan Note (Addendum)
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

## 2012-10-12 NOTE — Patient Instructions (Signed)
Aumente la dosis de hydralazine hasta 50 mg 3 veces al dia.   Baje la dosis de Simvastatin hasta 20 mg hora de dormir  Regresa en 3 semanas para chequeo de [employer].   Return in 3 weeks for blood test while fasting.   Voy a arreglar la referral with Dr Ricki Miller.

## 2012-10-12 NOTE — Progress Notes (Signed)
  Subjective:    Patient ID: Nathan Richards, male    DOB: [birth year], 67 y.o.   MRN: [id number]  Hypertension  Back Pain   He's been in [place] with his daughter for months, but is back in [place] for an undetermined period. No medical visits there. Hasn't seen his hematologist or cardiologist for some time.   Psych - Says his psychiatrist decreased his Fluoxetine, probably to 20 mg, and his Quetiapine.   Pain - in his low back since shoveling snow. Knees aren't hurting recently.   blood pressure - did take his medications today. Review of Systems  Musculoskeletal: Positive for back pain.       Objective:   Physical Exam  Cardiovascular: Normal rate and regular rhythm.   Pulmonary/Chest: Effort normal and breath sounds normal.  Abdominal: Soft. He exhibits no distension and no mass. There is no tenderness.  Musculoskeletal:  Normal range of motion of back and hips  Skin: Skin is warm and dry.  Psychiatric: He has a normal mood and affect. His behavior is normal.          Assessment & Plan:

## 2012-10-13 ENCOUNTER — Encounter: Payer: Self-pay | Admitting: Family Medicine

## 2012-10-13 LAB — COMPREHENSIVE METABOLIC PANEL
ALT: 20 U/L (ref 0–53)
Albumin: 4 g/dL (ref 3.5–5.2)
CO2: 30 mEq/L (ref 19–32)
Chloride: 106 mEq/L (ref 96–112)
Glucose, Bld: 90 mg/dL (ref 70–99)
Potassium: 3.8 mEq/L (ref 3.5–5.3)
Sodium: 143 mEq/L (ref 135–145)
Total Bilirubin: 0.5 mg/dL (ref 0.3–1.2)
Total Protein: 6.5 g/dL (ref 6.0–8.3)

## 2012-10-13 LAB — LDL CHOLESTEROL, DIRECT: Direct LDL: 86 mg/dL

## 2012-10-15 ENCOUNTER — Telehealth: Payer: Self-pay | Admitting: Family Medicine

## 2012-10-15 NOTE — Telephone Encounter (Signed)
Patient was suppose to receive a referral to an eye doctor when he was here on 10/12/12. Has not received.

## 2012-10-18 NOTE — Telephone Encounter (Signed)
[name] is to call patient and instruct him to call his insurance to find out if opthalmology visit is covered. According to Dr Margarita Grizzle office his insurance will not cover routine eye visit.[name], [name]

## 2012-11-02 ENCOUNTER — Ambulatory Visit: Payer: Medicare Other | Admitting: Family Medicine

## 2012-12-28 ENCOUNTER — Other Ambulatory Visit: Payer: Self-pay | Admitting: Family Medicine

## 2013-02-07 ENCOUNTER — Ambulatory Visit (INDEPENDENT_AMBULATORY_CARE_PROVIDER_SITE_OTHER): Payer: Medicare Other | Admitting: Family Medicine

## 2013-02-07 VITALS — BP 163/87 | HR 67 | Ht 62.5 in | Wt 141.0 lb

## 2013-02-07 DIAGNOSIS — C9110 Chronic lymphocytic leukemia of B-cell type not having achieved remission: Secondary | ICD-10-CM

## 2013-02-07 DIAGNOSIS — E785 Hyperlipidemia, unspecified: Secondary | ICD-10-CM

## 2013-02-07 DIAGNOSIS — I2510 Atherosclerotic heart disease of native coronary artery without angina pectoris: Secondary | ICD-10-CM

## 2013-02-07 DIAGNOSIS — Z0000 Encounter for general adult medical examination without abnormal findings: Secondary | ICD-10-CM

## 2013-02-07 DIAGNOSIS — F209 Schizophrenia, unspecified: Secondary | ICD-10-CM

## 2013-02-07 DIAGNOSIS — R21 Rash and other nonspecific skin eruption: Secondary | ICD-10-CM

## 2013-02-07 MED ORDER — PREDNISONE 10 MG PO TABS: ORAL_TABLET | ORAL | Status: DC

## 2013-02-07 MED ORDER — ASPIRIN 325 MG PO TBEC: 325.0000 mg | DELAYED_RELEASE_TABLET | Freq: Every day | ORAL | Status: DC

## 2013-02-07 MED ORDER — HYDROXYZINE PAMOATE 25 MG PO CAPS: 25.0000 mg | ORAL_CAPSULE | Freq: Four times a day (QID) | ORAL | Status: DC | PRN

## 2013-02-07 NOTE — Patient Instructions (Addendum)
Tome prednisone (esteroide) para las ronchas.  Puede tomar hydroxyzine para la comezon sea necesita.   Tome una aspirina.   Regrese en agosto para una cita para chequear pruebas de sangre y con doctor Nils Pyle semana mas tarde.

## 2013-02-07 NOTE — Progress Notes (Signed)
  Subjective:    Patient ID: Nathan Richards, male    DOB: 1945-11-17, 67 y.o.   MRN: [id number]  HPI # CLL WBC stable on February 2014.   # Rash on face and body  He thinks he got exposed to poison ivy on Friday 06/13 while working outside. He endorses itching. Progression: unchanged Meds tried: 1 pill for itching (?hydroxyzine)  Review of Systems Per HPI Denies joint pain, anxiety/depression, chest pain, dyspnea, difficulties with vision, double vision   Allergies, medication, past medical history reviewed.  Smoking status noted.  Social history: He has been to [place] once since February for a few months.      Objective:   Physical Exam GEN: NAD HEENT:   Head: [place]/AT; moderate area of swelling 2 cm under left eye   Eyes: no conjunctival erythema without tearing   Nose: no rhinorrhea, normal turbinates   Mouth: MMM; no tonsillar adenopathy; no oropharyngeal erythema NECK: no LAD SKIN: see above regarding swelling under eyes; excoriations, papular erythematous non-tender non-draining rash substernum, popliteal fossa bilaterally, and shins bilaterally CV: RRR, no m/r/g PULM: NI WOB; CTAB without w/r/r EXT: no pretibial edema    Assessment & Plan:

## 2013-02-07 NOTE — Assessment & Plan Note (Signed)
Asymptomatic.  He was advised to restart full dose aspirin since he stopped taking.

## 2013-02-07 NOTE — Assessment & Plan Note (Signed)
Asymptomatic.  Check CBC in August when he comes in for lipid testing.

## 2013-02-07 NOTE — Assessment & Plan Note (Signed)
This appears stable on his current medications.

## 2013-02-07 NOTE — Assessment & Plan Note (Signed)
History and presentation consistent with poison ivy dermatitis.  Due to diffuse involvement, long term prednisone taper.  Hydroxyzine prn.

## 2013-02-07 NOTE — Progress Notes (Signed)
  Subjective:    Patient ID: Nathan Richards, male    DOB: 09/01/1945, 67 y.o.   MRN: [id number]  HPI    Review of Systems     Objective:   Physical Exam        Assessment & Plan:

## 2013-02-23 ENCOUNTER — Other Ambulatory Visit: Payer: Medicare Other

## 2013-02-23 DIAGNOSIS — E785 Hyperlipidemia, unspecified: Secondary | ICD-10-CM

## 2013-02-23 DIAGNOSIS — C9110 Chronic lymphocytic leukemia of B-cell type not having achieved remission: Secondary | ICD-10-CM

## 2013-02-23 LAB — CBC
HCT: 43.1 % (ref 39.0–52.0)
Hemoglobin: 14.4 g/dL (ref 13.0–17.0)
MCHC: 33.4 g/dL (ref 30.0–36.0)

## 2013-02-23 LAB — LIPID PANEL
LDL Cholesterol: 48 mg/dL (ref 0–99)
Triglycerides: 53 mg/dL (ref <150)

## 2013-02-23 NOTE — Progress Notes (Signed)
CBC AND FLP DONE TODAY Nathan Richards

## 2013-03-01 ENCOUNTER — Other Ambulatory Visit: Payer: Self-pay | Admitting: Family Medicine

## 2013-03-03 ENCOUNTER — Other Ambulatory Visit: Payer: Self-pay | Admitting: *Deleted

## 2013-03-03 DIAGNOSIS — E785 Hyperlipidemia, unspecified: Secondary | ICD-10-CM

## 2013-03-03 DIAGNOSIS — I10 Essential (primary) hypertension: Secondary | ICD-10-CM

## 2013-03-03 MED ORDER — FENOFIBRATE 145 MG PO TABS: 145.0000 mg | ORAL_TABLET | Freq: Every day | ORAL | Status: DC

## 2013-03-04 ENCOUNTER — Other Ambulatory Visit: Payer: Self-pay | Admitting: Family Medicine

## 2013-03-08 ENCOUNTER — Telehealth: Payer: Self-pay | Admitting: Family Medicine

## 2013-03-08 NOTE — Telephone Encounter (Signed)
Pt called to know  labs result and will like dr. Walker Kehr call him .  [place]

## 2013-03-08 NOTE — Telephone Encounter (Signed)
Will fwd to Dr. Walker Kehr.  Nathan Richards, Nathan Richards, Cas[place]

## 2013-03-11 ENCOUNTER — Encounter: Payer: Self-pay | Admitting: Family Medicine

## 2013-03-11 NOTE — Telephone Encounter (Signed)
I called and left a message on his machine in Spanish that the lab test results were good, but that WBC remains high with no sign of bad changes. That is, into acute leukemia.

## 2013-03-14 ENCOUNTER — Encounter: Payer: Self-pay | Admitting: Family Medicine

## 2013-03-14 ENCOUNTER — Ambulatory Visit (INDEPENDENT_AMBULATORY_CARE_PROVIDER_SITE_OTHER): Payer: Medicare Other | Admitting: Family Medicine

## 2013-03-14 VITALS — BP 176/86 | HR 64 | Ht 62.5 in | Wt 141.0 lb

## 2013-03-14 DIAGNOSIS — E785 Hyperlipidemia, unspecified: Secondary | ICD-10-CM

## 2013-03-14 DIAGNOSIS — K2970 Gastritis, unspecified, without bleeding: Secondary | ICD-10-CM

## 2013-03-14 DIAGNOSIS — C9110 Chronic lymphocytic leukemia of B-cell type not having achieved remission: Secondary | ICD-10-CM

## 2013-03-14 DIAGNOSIS — F339 Major depressive disorder, recurrent, unspecified: Secondary | ICD-10-CM

## 2013-03-14 DIAGNOSIS — K2990 Gastroduodenitis, unspecified, without bleeding: Secondary | ICD-10-CM

## 2013-03-14 DIAGNOSIS — F209 Schizophrenia, unspecified: Secondary | ICD-10-CM

## 2013-03-14 DIAGNOSIS — I2510 Atherosclerotic heart disease of native coronary artery without angina pectoris: Secondary | ICD-10-CM

## 2013-03-14 NOTE — Assessment & Plan Note (Addendum)
No apparent psychotic ideation, but sleeping poorly so advised to restart his Quetiapine and follow up at [facility]

## 2013-03-14 NOTE — Progress Notes (Signed)
  Subjective:    Patient ID: Nathan Richards, male    DOB: 10/18/1945, 67 y.o.   MRN: [id number]  HPI Depression - He is happy to have seen his grand daughters who visited from [place] recently, but otherwise his PHQ - 9 is positive for depressive symptoms. He hasn't been taking his Fluoxetine for unclear reasons. Hasn't seen his psychiatrist at [facility] for some time.   Insomnia - difficult going to sleep. Hydroxyzine that he was taking for his now resolved rhus dermatitis helps this.  He hasn't been taking his Quetiapine because he was sometimes drunk feeling on it.    Abdominal pain - He's been taking the Nexium in the AM but still gets some burning above the umbilicus, worse the past 2 weeks. Also describes the discomfort there as a soreness like someone had been hitting him there. Has some low back pain also. Appetite is decreased and the discomfort is temporarily relieved by defecation, but no other GI symptoms nor dysuria.   Edema of his ankles - took lasix for this when he used to live in [place]. He didn't take his blood pressure medication this morning due to being too busy.   Review of Systems     Objective:   Physical Exam  Cardiovascular: Normal rate and regular rhythm.   No murmur heard. Pulmonary/Chest: Effort normal and breath sounds normal.  Abdominal: Soft. He exhibits no distension and no mass. There is no tenderness. There is no rebound and no guarding.  Musculoskeletal: He exhibits edema. He exhibits no tenderness.  Trace bilateral ankle edema  Knees not warm, enlarged or tender to palpation.  Neurological: He is alert.  Psychiatric: He has a normal mood and affect. His behavior is normal. Judgment and thought content normal.  Somewhat "goofy" in his responses which is typical for him          Assessment & Plan:

## 2013-03-14 NOTE — Assessment & Plan Note (Signed)
well controlled  

## 2013-03-14 NOTE — Patient Instructions (Addendum)
Cambiar a tomar Nexium antes de dormir. Change your Nexium to take at bedtime.   Empiece a tomar Fluoxetine y Quetiapine diario otra veza y 107 una cita de seguimiento con el siquiatra.  Start taking your Fluoxetine and Quetiapine daily again and make an appointment with your psychiatrist.    Gastritis - Adultos  (Gastritis, Adult)  La gastrittis es la irritacin (inflamacin) de la membrana interna del estmago. Puede ser Ardelia Mems enfermedad de inicio sbito (aguda) o de largo plazo (crnica). Si la gastritis no se trata, puede causar sangrado y lceras. CAUSAS  La gastritis se produce cuando la membrana que tapiza interiormente al estmago se debilita o se daa. Los jugos digestivos del estmago inflaman el revestimiento del estmago debilitado. El revestimiento del estmago puede debilitarse o daarse por una infeccin viral o bacteriana. La infeccin bacteriana ms comn es la infeccin por Helicobacter pylori. Tambin puede ser el resultado del consumo excesivo de alcohol, por el uso de ciertos medicamentos o porque hay demasiado cido en el estmago.  SNTOMAS  En algunos casos no hay sntomas. Si se presentan sntomas, stos pueden ser:   Dolor o sensacin de ardor en la parte superior del abdomen.  Nuseas.  Vmitos.  Sensacin molesta de distensin despus de comer. DIAGNSTICO  El mdico puede diagnosticar gastritis segn los sntomas y el examen fsico. Para determinar la causa de la gastritis, el mdico podr:   Pedir anlisis de sangre o de materia fecal para diagnosticar la presencia de la bacteria H pylori.  Gastroscopa. Un tubo delgado y flexible (endoscopio) se pasa por Industrial/product designer al [employer]. El endoscopio tiene [place] luz y una cmara en el extremo. El mdico utilizar el endoscopio para observar el interior del [place].  Tomar una muestra de tejido (biopsia) del estmago para examinarlo en el microscopio. TRATAMIENTO  Segn la causa de la gastritis podrn  recetarle: Antibiticos, si la causa es una infeccin bacteriana, como una infeccin por H. pylori. Anticidos o bloqueadores H2, si hay demasiado cido en el estmago. El [employer] aconsejar que deje de tomar aspirina, ibuprofeno u otros antiinflamatorios no esteroides (AINE).  INSTRUCCIONES PARA EL CUIDADO EN EL HOGAR   Tome slo medicamentos de venta libre o recetados, segn las indicaciones del mdico.  Si le han recetado antibiticos, tmelos segn las indicaciones. Tmelos todos, aunque se sienta mejor.  Debe ingerir gran cantidad de lquido para mantener la orina de tono claro o color amarillo plido.  Evite las comidas y bebidas que empeoran los [place], [place]:  [place] con cafena o alcohlicas.  Chocolate.  Sabores a English as a second language teacher.  Ajo y cebolla.  Comidas muy condimentadas.  Ctricos como naranjas, limones o limas.  Alimentos que contengan tomate, como salsas, [place] y pizza.  Alimentos fritos y Radio broadcast assistant.  Haga comidas pequeas durante Psychiatrist de 3 comidas abundantes. SOLICITE ATENCIN MDICA DE INMEDIATO SI:   La materia fecal es negra o de color rojo oscuro.  Vomita sangre de color rojo brillante o material similar a granos de caf.  No puede retener los lquidos.  El dolor abdominal empeora.  Tiene fiebre.  No mejora luego de 1 semana.  Tiene preguntas o preocupaciones. ASEGRESE DE QUE:   Comprende estas instrucciones.  Controlar su enfermedad.  Solicitar ayuda de inmediato si no mejora o si empeora. Document Released: 05/21/2005 Document Revised: 05/05/2012 [facility] Patient Information 2014 [place], [place].   Please return to see Dr Walker Kehr in 1 month

## 2013-03-14 NOTE — Assessment & Plan Note (Signed)
Last WBC remained the same around 29K

## 2013-03-14 NOTE — Assessment & Plan Note (Signed)
Doubt pancreatitis since glucose and triglycerides normal and denies alcohol use. Reconsider if pain more severe. Advised to move his Nexium dosing to bedtime and to take Tums if he has symptoms during the day.

## 2013-03-14 NOTE — Assessment & Plan Note (Signed)
Many depression symptoms to advised to restart his Fluoxetine and follow up at [facility]

## 2013-03-14 NOTE — Progress Notes (Signed)
Interpreter Lesle Chris for [facility]

## 2013-03-14 NOTE — Assessment & Plan Note (Signed)
He denies recent chest pain

## 2013-04-19 ENCOUNTER — Ambulatory Visit (INDEPENDENT_AMBULATORY_CARE_PROVIDER_SITE_OTHER): Payer: PRIVATE HEALTH INSURANCE | Admitting: Family Medicine

## 2013-04-19 ENCOUNTER — Encounter: Payer: Self-pay | Admitting: Family Medicine

## 2013-04-19 VITALS — BP 131/75 | HR 56 | Temp 98.2°F | Ht 62.0 in | Wt 141.0 lb

## 2013-04-19 DIAGNOSIS — H02409 Unspecified ptosis of unspecified eyelid: Secondary | ICD-10-CM

## 2013-04-19 DIAGNOSIS — H02403 Unspecified ptosis of bilateral eyelids: Secondary | ICD-10-CM

## 2013-04-19 DIAGNOSIS — I10 Essential (primary) hypertension: Secondary | ICD-10-CM

## 2013-04-19 DIAGNOSIS — M1990 Unspecified osteoarthritis, unspecified site: Secondary | ICD-10-CM

## 2013-04-19 DIAGNOSIS — C9110 Chronic lymphocytic leukemia of B-cell type not having achieved remission: Secondary | ICD-10-CM

## 2013-04-19 DIAGNOSIS — Z23 Encounter for immunization: Secondary | ICD-10-CM

## 2013-04-19 MED ORDER — DILTIAZEM HCL ER COATED BEADS 240 MG PO CP24: ORAL_CAPSULE | ORAL | Status: DC

## 2013-04-19 NOTE — Assessment & Plan Note (Signed)
No signs of conversion to ALL

## 2013-04-19 NOTE — Assessment & Plan Note (Signed)
Recently asymptomatic

## 2013-04-19 NOTE — Patient Instructions (Addendum)
Please return to see Dr Ree Kida in 6 months. Expect to have labs done then.   We will contact you about the opthalmology referral for your eyelid drooping.

## 2013-04-19 NOTE — Assessment & Plan Note (Signed)
Will refer to opthalmology for consideration of surgery

## 2013-04-19 NOTE — Progress Notes (Signed)
  Subjective:    Patient ID: Nathan Richards, male    DOB: Oct 05, 1945, 67 y.o.   MRN: [id number]  HPI Knee pains - not a recent problem   CAD - no recent chest pain   CLL - no nodules or infection symptoms   Psych - no hallucinations off Seroquel. Doesn't see a need to see his psychiatrist.  Review of Systems     Objective:   Physical Exam  Constitutional: He appears well-developed and well-nourished.  Cardiovascular: Normal rate and regular rhythm.   Pulmonary/Chest: Effort normal and breath sounds normal.  Abdominal: Soft. He exhibits no distension and no mass. There is no tenderness.  NO splenomegaly  Musculoskeletal: He exhibits no edema.  Lymphadenopathy:    He has no cervical adenopathy.  Neurological: He is alert.  Psychiatric: He has a normal mood and affect. His behavior is normal. Thought content normal.          Assessment & Plan:

## 2013-04-19 NOTE — Assessment & Plan Note (Signed)
well controlled  

## 2013-04-21 ENCOUNTER — Telehealth: Payer: Self-pay | Admitting: Family Medicine

## 2013-04-21 MED ORDER — SIMVASTATIN 10 MG PO TABS: ORAL_TABLET | ORAL | Status: DC

## 2013-04-21 NOTE — Telephone Encounter (Signed)
Due to warning about interaction with Diltiazem I am decreasing the Simvastatin dose to 10 mg daily.

## 2013-05-16 ENCOUNTER — Other Ambulatory Visit: Payer: Self-pay | Admitting: Family Medicine

## 2013-05-18 ENCOUNTER — Telehealth: Payer: Self-pay | Admitting: Family Medicine

## 2013-05-18 MED ORDER — TRAMADOL-ACETAMINOPHEN 37.5-325 MG PO TABS: 1.0000 | ORAL_TABLET | Freq: Four times a day (QID) | ORAL | Status: DC | PRN

## 2013-05-18 NOTE — Telephone Encounter (Signed)
Refill completed, placed in front office for pickup.

## 2013-05-18 NOTE — Telephone Encounter (Signed)
Pt is advised Rx is ready to be picked up

## 2013-05-18 NOTE — Telephone Encounter (Signed)
Will forward to Dr Ree Kida

## 2013-05-18 NOTE — Telephone Encounter (Signed)
Pharmacy says they will not refill his Tramadol prescription without a written copy. The pharmacy is [facility] Please advise

## 2013-05-31 ENCOUNTER — Telehealth: Payer: Self-pay | Admitting: Family Medicine

## 2013-05-31 MED ORDER — ESOMEPRAZOLE MAGNESIUM 40 MG PO CPDR: 40.0000 mg | DELAYED_RELEASE_CAPSULE | Freq: Every day | ORAL | Status: DC

## 2013-05-31 NOTE — Telephone Encounter (Signed)
Pt is calling and would like a refill on his Nexium be sent to [facility] at [facility]. He is using this pharmacy only now. JW

## 2013-05-31 NOTE — Telephone Encounter (Signed)
Done. Nathan Richards, Nathan Richards

## 2013-07-08 ENCOUNTER — Ambulatory Visit (INDEPENDENT_AMBULATORY_CARE_PROVIDER_SITE_OTHER): Payer: PRIVATE HEALTH INSURANCE | Admitting: Family Medicine

## 2013-07-08 VITALS — BP 190/102 | HR 68 | Ht 62.0 in | Wt 144.0 lb

## 2013-07-08 DIAGNOSIS — C9110 Chronic lymphocytic leukemia of B-cell type not having achieved remission: Secondary | ICD-10-CM

## 2013-07-08 DIAGNOSIS — M1990 Unspecified osteoarthritis, unspecified site: Secondary | ICD-10-CM

## 2013-07-08 DIAGNOSIS — Z0000 Encounter for general adult medical examination without abnormal findings: Secondary | ICD-10-CM

## 2013-07-08 DIAGNOSIS — I2510 Atherosclerotic heart disease of native coronary artery without angina pectoris: Secondary | ICD-10-CM

## 2013-07-08 DIAGNOSIS — I10 Essential (primary) hypertension: Secondary | ICD-10-CM

## 2013-07-08 DIAGNOSIS — E785 Hyperlipidemia, unspecified: Secondary | ICD-10-CM

## 2013-07-08 MED ORDER — ATORVASTATIN CALCIUM 20 MG PO TABS: 20.0000 mg | ORAL_TABLET | Freq: Every day | ORAL | Status: DC

## 2013-07-08 MED ORDER — CLONIDINE HCL 0.1 MG PO TABS: 0.1000 mg | ORAL_TABLET | Freq: Once | ORAL | Status: DC

## 2013-07-08 MED ORDER — ASPIRIN 81 MG PO TBEC: 81.0000 mg | DELAYED_RELEASE_TABLET | Freq: Every day | ORAL | Status: DC

## 2013-07-08 MED ORDER — HYDRALAZINE HCL 50 MG PO TABS: ORAL_TABLET | ORAL | Status: DC

## 2013-07-08 MED ORDER — HYDROXYZINE PAMOATE 25 MG PO CAPS: 25.0000 mg | ORAL_CAPSULE | Freq: Four times a day (QID) | ORAL | Status: DC | PRN

## 2013-07-08 NOTE — Patient Instructions (Addendum)
High Cholesterol - stop Fenofibrate and Simvastatin, start Lipitor  High Blood pressure - refills provided, please make an appointment for a nurse visit in one week for blood pressure check.   Back Pain - Due to arthritis, continue to take Tramadol as needed, continue to use heat/ice as tolerated to decrease.

## 2013-07-11 NOTE — Assessment & Plan Note (Signed)
Well controlled based on lipid profile in 02/2013 -Transitioned Simvastatin and Fenofibrate to Lipitor daily to decrease total number of medications.

## 2013-07-11 NOTE — Progress Notes (Signed)
  Subjective:    Patient ID: Nathan Richards, male    DOB: 03-Jan-1946, 67 y.o.   MRN: [id number]  HPI 67 y/o male presents for follow up of multiple medical issues:  1. HTN - currently on Hydralazine (has been out of for a few weeks) and Diltiazem, no side effects noted, no chest pain, no vision changes, no headaches, no lightheadedness, compliant with Diltiazem  2. HLD - currently on Simvastatin and Fenofibrate, has history of CAD, compliant with medications, no side effects noted, he is unsure if he has ever been on Lipitor  3. Back pain - history of osteoarthritis of multiple locations, reports bilateral low back pain, no radiation of pain down legs, no weakness or numbness of lower extremities noted, no bowel or bladder incontinence, no saddle anesthesia, takes tramadol prn pain, does not heat or ice his back on a regular basis  Social - former smoker   Review of Systems  Constitutional: Negative for fever, chills and fatigue.  Respiratory: Negative for cough and wheezing.   Cardiovascular: Negative for chest pain, palpitations and leg swelling.  Gastrointestinal: Negative for nausea, vomiting, diarrhea and constipation.  Musculoskeletal: Positive for arthralgias and back pain. Negative for gait problem and joint swelling.       Objective:   Physical Exam Vitals: reviewed, blood pressure elevated Gen: pleasant Hispanic male, NAD HEENT: PEERL, EOMI, MMM, neck was supple, no adenopathy Cardiac: RRR, S1 and S2 present, no murmurs, no heaves/thrills Resp: CTAB, normal effort Ext: no edema, 2+ radial and DP pulses MSK: bilateral lumbar paraspinal tenderness, no midline tenderness Neuro: strength 5/5 for hip flexion/knee flexion and extension/dorsiflexion and plantarflexion of bilateral feet, no gross loss of sensation to light touch, SLR negative bilaterally        Assessment & Plan:  Please see problem specific assessment and plan.

## 2013-07-11 NOTE — Assessment & Plan Note (Signed)
Patient intermittently has low back pain that is controlled with PRN Tramadol -No change to current pharmacotherapy -Recommended heat to the affected area to decrease muscle spasm.

## 2013-07-11 NOTE — Assessment & Plan Note (Signed)
Uncontrolled, likely due to having run out of Hydralazine recently. No symptoms of end organ damage.  -Patient was given one dose of Clonidine 0.1 mg -Patient left AMA prior to recheck of blood pressure (AMA form not completed by nursing staff), he was informed to make nursing appointment in the next few day to recheck blood pressure -Refill of Hydralazine was provided

## 2013-08-22 ENCOUNTER — Other Ambulatory Visit: Payer: Self-pay | Admitting: Family Medicine

## 2013-09-27 ENCOUNTER — Encounter: Payer: Self-pay | Admitting: Family Medicine

## 2013-09-27 ENCOUNTER — Ambulatory Visit (INDEPENDENT_AMBULATORY_CARE_PROVIDER_SITE_OTHER): Payer: Commercial Managed Care - HMO | Admitting: Family Medicine

## 2013-09-27 VITALS — BP 188/95 | HR 62 | Ht 62.0 in | Wt 138.0 lb

## 2013-09-27 DIAGNOSIS — E785 Hyperlipidemia, unspecified: Secondary | ICD-10-CM

## 2013-09-27 DIAGNOSIS — F209 Schizophrenia, unspecified: Secondary | ICD-10-CM

## 2013-09-27 DIAGNOSIS — Z91148 Patient's other noncompliance with medication regimen for other reason: Secondary | ICD-10-CM

## 2013-09-27 DIAGNOSIS — Z9119 Patient's noncompliance with other medical treatment and regimen: Secondary | ICD-10-CM

## 2013-09-27 DIAGNOSIS — Z91199 Patient's noncompliance with other medical treatment and regimen due to unspecified reason: Secondary | ICD-10-CM

## 2013-09-27 DIAGNOSIS — Z9114 Patient's other noncompliance with medication regimen: Secondary | ICD-10-CM | POA: Insufficient documentation

## 2013-09-27 DIAGNOSIS — H539 Unspecified visual disturbance: Secondary | ICD-10-CM

## 2013-09-27 DIAGNOSIS — I10 Essential (primary) hypertension: Secondary | ICD-10-CM

## 2013-09-27 MED ORDER — ATORVASTATIN CALCIUM 20 MG PO TABS: 20.0000 mg | ORAL_TABLET | Freq: Every day | ORAL | Status: DC

## 2013-09-27 NOTE — Progress Notes (Addendum)
   Subjective:    Patient ID: Nathan Richards, male    DOB: Nov 25, 1945, 68 y.o.   MRN: [id number]  HPI 68 year old male presents for followup of multiple medical problems. Interview is conducted with the help of telephone interpreter.  Hypertension-patient currently on diltiazem and hydralazine, patient reports noncompliance with current medication regimen, he states it is often difficult for him to take hydralazine more than once daily and will often this his dose of diltiazem, patient currently denies chest pain, he does report occasional blurry vision which is chronic in nature, no lightheadedness  Hyperlipidemia-at most recent visit patient was prescribed atorvastatin, he completed his first prescription however did not have it refilled, patient is asking for refill today, he tolerated the atorvastatin well  Schizophrenia-patient has a history of depression and schizophrenia and has previously been seen by [facility], his last visit was over a year ago, patient has subsequently discontinued both his Seroquel and Prozac, patient denies current depressive symptoms, denies suicidal and homicidal ideation, denies current hallucinations, patient states that he feels well from a psychiatric standpoint, he was previously given referral to [place] speaking psychiatrist however he apparently was told to only call the psychiatrist if patient felt he was having symptoms  Vision changes-patient reports chronic vision changes and has recently gone to see an ophthalmologist however was told that he needs referral, patient requests referral today  Social: former smoker   Review of Systems  Constitutional: Negative for chills and fatigue.  HENT: Negative for congestion.   Eyes: Negative for pain and itching.  Respiratory: Positive for cough. Negative for choking and shortness of breath.   Cardiovascular: Negative for chest pain and leg swelling.  Gastrointestinal: Negative for nausea and  diarrhea.       Objective:   Physical Exam Vitals: Reviewed General: Pleasant Hispanic male: No acute distress HEENT: Normocephalic, pupils are equal round and reactive to light, extraocular movements are intact, no scleral icterus, moist mucous members, uvula midline, neck was supple Cardiac: Regular rate and rhythm, S1-S2 present, no heaves or thrills, no murmurs Respiratory: Clear to auscultation bilaterally, normal effort Extremities: No lower extremity edema, bilateral 2+ radial pulses,  Psychiatric: Patient is appropriate dressed, states mood is good, no tangential thoughts, no homicidal or suicidal thoughts       Assessment & Plan:  Please see problem specific assessment and plan.

## 2013-09-27 NOTE — Assessment & Plan Note (Signed)
Patient has not been compliant with atorvastatin. -Refill provided and stressed importance of taking daily

## 2013-09-27 NOTE — Assessment & Plan Note (Signed)
Patient currently off all psychiatric medications. He has not followed up with his psychiatrist in over one year. Patient appears to be doing well off medications -Patient was counseled to followup with his psychiatrist as soon as possible

## 2013-09-27 NOTE — Patient Instructions (Signed)
High Blood Pressure - take Diltiazem daily, take Hydralazine three times a day, check lab work today, Return to office in 1 week for recheck of blood pressure  High Cholesterol - take Atorvastatin daily  Schizophrenia - please make an appointment with [facility] or with Spanish Psychiatrist  Poor Vision - a referral has been made to an opthalmologist

## 2013-09-27 NOTE — Assessment & Plan Note (Signed)
Patient is noncompliant with the majority of his medications. -Discussed ways to improve medication compliance including daily calendar and apps.  -Stress importance of the patient taking his daily medications

## 2013-09-27 NOTE — Assessment & Plan Note (Signed)
Uncontrolled due to patient noncompliance. -Diltiazem is currently once daily which may help with compliance -Hydralazine is 3 times daily which is difficult for the patient to remember -Patient is not a candidate for beta blocker as he is also currently on diltiazem, patient has a history of CKD 3 and is likely not a candidate for ACE inhibitor/ARB/HCTZ however will recheck BMP today to monitor kidney function, amlodipine may be another option for the patient however he is oriented calcium channel blocker -Patient to return to office in one week for recheck of blood pressure when he has been compliant with his daily medication

## 2013-09-27 NOTE — Assessment & Plan Note (Signed)
Referral to ophthalmology made today.

## 2013-09-28 LAB — BASIC METABOLIC PANEL
BUN: 25 mg/dL — ABNORMAL HIGH (ref 6–23)
CHLORIDE: 105 meq/L (ref 96–112)
CO2: 26 mEq/L (ref 19–32)
CREATININE: 1.41 mg/dL — AB (ref 0.50–1.35)
Calcium: 10.6 mg/dL — ABNORMAL HIGH (ref 8.4–10.5)
Glucose, Bld: 103 mg/dL — ABNORMAL HIGH (ref 70–99)
POTASSIUM: 4.6 meq/L (ref 3.5–5.3)
SODIUM: 143 meq/L (ref 135–145)

## 2013-09-30 ENCOUNTER — Encounter: Payer: Self-pay | Admitting: Family Medicine

## 2013-10-04 ENCOUNTER — Ambulatory Visit (INDEPENDENT_AMBULATORY_CARE_PROVIDER_SITE_OTHER): Payer: Medicare HMO | Admitting: Family Medicine

## 2013-10-04 ENCOUNTER — Encounter: Payer: Self-pay | Admitting: Family Medicine

## 2013-10-04 VITALS — BP 182/108 | HR 75 | Temp 97.7°F | Ht 62.0 in | Wt 137.0 lb

## 2013-10-04 DIAGNOSIS — R059 Cough, unspecified: Secondary | ICD-10-CM | POA: Insufficient documentation

## 2013-10-04 DIAGNOSIS — N183 Chronic kidney disease, stage 3 unspecified: Secondary | ICD-10-CM

## 2013-10-04 DIAGNOSIS — R05 Cough: Secondary | ICD-10-CM | POA: Insufficient documentation

## 2013-10-04 DIAGNOSIS — I10 Essential (primary) hypertension: Secondary | ICD-10-CM

## 2013-10-04 MED ORDER — DILTIAZEM HCL ER COATED BEADS 300 MG PO CP24: ORAL_CAPSULE | ORAL | Status: DC

## 2013-10-04 MED ORDER — LOSARTAN POTASSIUM 50 MG PO TABS: 50.0000 mg | ORAL_TABLET | Freq: Every day | ORAL | Status: DC

## 2013-10-04 NOTE — Assessment & Plan Note (Signed)
Cough due to viral illness. -Conservative measures discussed including fluid intake, Motrin and Tylenol as needed for fevers, avoidance of nasal decongestants given history of hypertension, patient may take over-the-counter Mucinex as needed for nasal congestion

## 2013-10-04 NOTE — Assessment & Plan Note (Signed)
Blood pressure remains uncontrolled. Patient reports increased compliant since last visit. Patient has previous allergy/intolerance to HCTZ, amlodipine, ACE inhibitors, and beta blockers. -Diltiazem increased to 300 mg daily -Discontinued hydralazine as patient having difficulty taking 3 times daily -Started losartan 50 mg daily -Patient to return to office in 2 weeks for recheck of blood pressure

## 2013-10-04 NOTE — Assessment & Plan Note (Signed)
Patient's creatinine remained stable based on most recent basic metabolic panel -Work to improve blood pressure control

## 2013-10-04 NOTE — Progress Notes (Signed)
   Subjective:    Patient ID: Nathan Richards, male    DOB: [birth year], 68 y.o.   MRN: [id number]  HPI 68 year old male presents for followup of uncontrolled hypertension, currently taking diltiazem 240 mg daily and hydralazine 50 mg 3 times a day, patient states that he is been more compliant with taking his medications as prescribed, denies current chest pain, no lightheadedness, patient was referred to ophthalmology at last visit for vision changes, patient has upcoming appointment  Patient reports cough and cold symptoms for the past 3-4 days, cough is mildly productive, patient did report one bloody nose and subsequent blood tinged sputum which has resolved, no fevers or chills, rhinorrhea present, mild is a congestion, he has had mild headache secondary to coughing, patient did have abdominal pain 2 days ago which has since resolved, he did not take his blood pressure medications he states due to the abdominal pain, no diarrhea, no nausea, no vomiting  Social-patient is from [place], imigrated to the [place] approximately 11 years ago Reviewed patient's allergies and are up-to-date in Epic   Review of Systems  Constitutional: Negative for fever, chills and fatigue.  HENT: Positive for congestion, postnasal drip and rhinorrhea.   Respiratory: Positive for cough. Negative for shortness of breath.   Cardiovascular: Negative for chest pain, palpitations and leg swelling.  Gastrointestinal: Negative for nausea, vomiting, abdominal pain and diarrhea.       Objective:   Physical Exam Vitals: Reviewed General: Pleasant Hispanic male, accompanied by interpreter, coughing intermittently during exam HEENT: Normocephalic, bilateral TMs are pearly-gray without bulging or erythema, pupils are equal round and reactive to light, extraocular movements are intact, no scleral icterus, nasal septum midline, mild rhinorrhea present, moist mucous members, uvula midline, no pharyngeal erythema or  exudate noted, neck was supple, no anterior posterior cervical lymphadenopathy Cardiac: Regular in rhythm, S1 and S2 present, no murmurs, no heaves or thrills Respiratory: Clear to auscultation bilaterally, normal effort Extremities: No edema, 2+ radial pulses bilaterally  Reviewed recent BMP     Assessment & Plan:  Please see problem specific assessment and plan.

## 2013-10-04 NOTE — Patient Instructions (Signed)
High Blood Pressure - Stop Hydralazine, Increase Diltiazem to 300 mg daily, Start Losartan 50 mg daily, return to office in 2 weeks for recheck of bleed pressure  Cough - may take Mucinex as needed for congestion, avoid over the counter decongestants which can cause increased blood pressures  Call [employer] to check which Psychiatrists are available in [place].  Return to office in 2 weeks to check blood pressure.

## 2013-10-18 ENCOUNTER — Ambulatory Visit: Payer: Medicare HMO | Admitting: Family Medicine

## 2013-10-21 ENCOUNTER — Ambulatory Visit (INDEPENDENT_AMBULATORY_CARE_PROVIDER_SITE_OTHER): Payer: Medicare HMO | Admitting: Family Medicine

## 2013-10-21 ENCOUNTER — Encounter: Payer: Self-pay | Admitting: Family Medicine

## 2013-10-21 VITALS — BP 152/83 | HR 58 | Ht 62.0 in | Wt 136.0 lb

## 2013-10-21 DIAGNOSIS — I10 Essential (primary) hypertension: Secondary | ICD-10-CM

## 2013-10-21 DIAGNOSIS — H6980 Other specified disorders of Eustachian tube, unspecified ear: Secondary | ICD-10-CM

## 2013-10-21 DIAGNOSIS — H6990 Unspecified Eustachian tube disorder, unspecified ear: Secondary | ICD-10-CM | POA: Insufficient documentation

## 2013-10-21 DIAGNOSIS — F209 Schizophrenia, unspecified: Secondary | ICD-10-CM

## 2013-10-21 MED ORDER — DILTIAZEM HCL ER COATED BEADS 300 MG PO CP24: 300.0000 mg | ORAL_CAPSULE | Freq: Every day | ORAL | Status: DC

## 2013-10-21 MED ORDER — BLOOD PRESSURE MONITOR DEVI: Status: DC

## 2013-10-21 NOTE — Assessment & Plan Note (Signed)
Patient refuses to return to psychiatrist at this time. Patient does appear stable off of antipsychotic medications. -Return to psychiatrist as needed

## 2013-10-21 NOTE — Progress Notes (Signed)
   Subjective:    Patient ID: Nathan Richards, male    DOB: July 10, 1946, 68 y.o.   MRN: [id number]  HPI 68 y/o male presents for follow up of hypertension. Interview completed with the help of an interpretor.   Hypertension - started on Diltiazem 300 mg CD (24 hour tab) at last visit. Prescribed as twice daily for unclear reasons (perhaps entered in error on prescription), patient has mostly only been taking once daily as he often forgets to take the second dose, also started on Losartan 50 mg daily, he reports compliance with Lostartan, tolerating this medication well, no chest pain, no vision changes, no headache  Ear Fullness - present for the past few weeks, associated cough/rhinorrhea/sore throat. Has been taking Robafen and Guaifenesin which provided relief of nasal symtpoms, no fevers or chills, no shortness of breath  History of Schizophrenia - patient has been off Seroquel and Prozac greater than 6 months, patient does not wish to return to psychiatrist, denies recent hallucinations (both visual and auditory), no depressive symptoms, sleeping well, no SI, no HI, patient has number for spanish speaking psychiatrist but does not wish to seek treatment as he feels that he is currently stable.   Reviewed social history.    Review of Systems  Constitutional: Negative for chills and fatigue.  HENT: Positive for congestion. Negative for sore throat and tinnitus.   Respiratory: Positive for cough. Negative for shortness of breath.   Cardiovascular: Negative for chest pain.       Objective:   Physical Exam Vitals: reviewed Gen: pleasant Hispanic male, NAD HEENT: normocephalic, TM's pearly grey bilaterally without bulging or erythema, PERRL, EOMI, no rhinorrhea, MMM, no pharyngeal erythema or exudate, no cervical adenopathy Cardiac: RRR, S1 and S2 present, no murmurs, no heaves/thrills Resp: CTAB, normal effort Ext: no edema       Assessment & Plan:  Please see problem specific  assessment and plan.

## 2013-10-21 NOTE — Assessment & Plan Note (Signed)
Improved since last visit. -Continue diltiazem extended release 300 mg daily and losartan 50 mg daily -Patient to check home blood pressures to return to office in one month for recheck

## 2013-10-21 NOTE — Assessment & Plan Note (Signed)
Eustachian tube dysfunction likely secondary to acute viral illness. -Patient to continue over-the-counter cough and cold medications -If symptoms persist could consider nasal steroid

## 2013-10-21 NOTE — Patient Instructions (Addendum)
High Blood Pressure - improved today, continue the Diltiazam and Losartan, check blood pressure at home 3-4 times per week and bring readings with you at your next visit.   Eustacian Tube Dysfunction - continue the Robafen and Guifenasin  Return to office in one month.

## 2013-10-27 ENCOUNTER — Telehealth: Payer: Self-pay | Admitting: Family Medicine

## 2013-10-27 DIAGNOSIS — H6980 Other specified disorders of Eustachian tube, unspecified ear: Secondary | ICD-10-CM

## 2013-10-27 MED ORDER — FLUTICASONE PROPIONATE 50 MCG/ACT NA SUSP: 2.0000 | Freq: Every day | NASAL | Status: DC

## 2013-10-27 NOTE — Telephone Encounter (Signed)
Pt called and stated ear infection continued and need medication for it. Please let me know if I need to call pt back with instruction.    [place]

## 2013-10-27 NOTE — Telephone Encounter (Signed)
Patient recently diagnosed with Eustacian tube dysfunction. Flonase sent to pharmacy. Please call patient for pickup.

## 2013-10-27 NOTE — Telephone Encounter (Signed)
Pt is aware about medication. Pt was agree to go to pick it up at the pharmacy.  [place]

## 2013-11-11 ENCOUNTER — Other Ambulatory Visit: Payer: Self-pay | Admitting: Family Medicine

## 2013-11-11 DIAGNOSIS — I10 Essential (primary) hypertension: Secondary | ICD-10-CM

## 2013-11-11 MED ORDER — ATORVASTATIN CALCIUM 20 MG PO TABS: 20.0000 mg | ORAL_TABLET | Freq: Every day | ORAL | Status: DC

## 2013-11-11 MED ORDER — ESOMEPRAZOLE MAGNESIUM 40 MG PO CPDR: 40.0000 mg | DELAYED_RELEASE_CAPSULE | Freq: Every day | ORAL | Status: DC

## 2013-11-11 MED ORDER — DILTIAZEM HCL ER COATED BEADS 300 MG PO CP24: 300.0000 mg | ORAL_CAPSULE | Freq: Every day | ORAL | Status: DC

## 2013-11-11 MED ORDER — TRAMADOL-ACETAMINOPHEN 37.5-325 MG PO TABS: ORAL_TABLET | ORAL | Status: DC

## 2013-11-11 MED ORDER — LOSARTAN POTASSIUM 50 MG PO TABS: 50.0000 mg | ORAL_TABLET | Freq: Every day | ORAL | Status: DC

## 2013-11-11 MED ORDER — NITROGLYCERIN 0.4 MG SL SUBL: 0.4000 mg | SUBLINGUAL_TABLET | SUBLINGUAL | Status: DC

## 2013-11-11 NOTE — Telephone Encounter (Signed)
Patient has switched pharmacy to [facility], refills provided as documented (sent via fax).

## 2013-11-18 ENCOUNTER — Ambulatory Visit (INDEPENDENT_AMBULATORY_CARE_PROVIDER_SITE_OTHER): Payer: Medicare HMO | Admitting: Family Medicine

## 2013-11-18 ENCOUNTER — Encounter: Payer: Self-pay | Admitting: Family Medicine

## 2013-11-18 VITALS — BP 176/92 | HR 60 | Temp 97.9°F | Ht 62.0 in | Wt 140.0 lb

## 2013-11-18 DIAGNOSIS — H6980 Other specified disorders of Eustachian tube, unspecified ear: Secondary | ICD-10-CM

## 2013-11-18 DIAGNOSIS — I10 Essential (primary) hypertension: Secondary | ICD-10-CM

## 2013-11-18 MED ORDER — LOSARTAN POTASSIUM 50 MG PO TABS: 100.0000 mg | ORAL_TABLET | Freq: Every day | ORAL | Status: DC

## 2013-11-18 NOTE — Assessment & Plan Note (Signed)
Improved with daily Flonase -continue current therapy.

## 2013-11-18 NOTE — Assessment & Plan Note (Signed)
Uncontrolled. No acute red flag symptoms. Suspect due to patient only taking 240 mg Cardizem as he ran out of his prescribed dose of 300 mg daily. -will increase Losartan to 100 mg daily -Patient to resume Cardizem 300 mg daily once he receives refill from [facility] -Return to office in 2-4 weeks for blood pressure recheck.

## 2013-11-18 NOTE — Patient Instructions (Signed)
High Blood Pressure - increase Losartan to 100 mg daily (take two 50 mg tablets daily). Call [employer] to check on the status of your refilled medications. Start Cardizem 300 mg daily once you have your refills.  Allergies/Ear fullness - continue daily Flonase  Scheduled your yearly Medicare visit with Lamont Dowdy. Please schedule with an Interpretor.  Return in 2-4 weeks to check blood pressure. Check blood work at next visit.

## 2013-11-18 NOTE — Progress Notes (Signed)
   Subjective:    Patient ID: Nathan Richards, male    DOB: [birth year], 68 y.o.   MRN: [id number]  HPI 68 y/o male presents for follow up of multiple medical issues.  Hypertension - patient is supposed to be on Cardizem 300 mg daily and Losartan 50 mg daily, recently switched to [facility], refills for medications were faxed to [facility] from our office however patient has not received these medications, as a result he ran out of Cardizem and has had to take and old refill of Cardizem 240 mg daily, he has continued the correct dose of Losartan, no chest pain, previous vision changes are improved after he went to the Ophthalmologist and now has glasses. He has not been check home BP's as the blood pressure cuff was too expensive.   Eustacian tube dysfunction/allergic rhinitis - improved with flonase, still has some mild ear fullness   Review of Systems  Constitutional: Negative for fever, chills and fatigue.  Respiratory: Negative for cough and shortness of breath.   Cardiovascular: Negative for chest pain and leg swelling.       Objective:   Physical Exam Vitals: Reviewed General: Pleasant male, no acute distress HEENT: Normocephalic, pupils equal round and reactive to light, extra amounts are intact, bilateral TMs are pearly-gray without erythema or bulging, moist because membranes, uvula midline, neck was supple Cardiac: Regular in rhythm, S1 and S2 present, no murmurs, no heaves or thrills Respiratory: Clear to patient bilaterally, normal effort Extremities: 2+ radial pulses bilaterally, no pedal edema   Assessment & Plan:  Please see problem specific assessment and plan.  Patient recently turned 68 and is eligible for Medicare annual wellness examination.

## 2013-12-16 ENCOUNTER — Ambulatory Visit: Payer: Medicare HMO | Admitting: Family Medicine

## 2014-02-21 ENCOUNTER — Ambulatory Visit (INDEPENDENT_AMBULATORY_CARE_PROVIDER_SITE_OTHER): Payer: Commercial Managed Care - HMO | Admitting: Family Medicine

## 2014-02-21 VITALS — BP 142/82 | HR 62 | Temp 98.1°F | Ht 62.0 in | Wt 136.0 lb

## 2014-02-21 DIAGNOSIS — M79609 Pain in unspecified limb: Secondary | ICD-10-CM

## 2014-02-21 DIAGNOSIS — M79604 Pain in right leg: Secondary | ICD-10-CM

## 2014-02-21 DIAGNOSIS — I10 Essential (primary) hypertension: Secondary | ICD-10-CM

## 2014-02-21 DIAGNOSIS — M79605 Pain in left leg: Secondary | ICD-10-CM

## 2014-02-21 DIAGNOSIS — I2510 Atherosclerotic heart disease of native coronary artery without angina pectoris: Secondary | ICD-10-CM

## 2014-02-21 NOTE — Patient Instructions (Signed)
Blood Pressure - controlled, continue current medications  Leg Pain - unclear at this time what is causing the pain, may be secondary to decreased blood flow to your legs with exercise, we may need to do some testing on your legs but lets attempt a trial of exercise first (start walking for 5 minutes at a time, gradually work your way up to 15 minutes over the next few weeks)  Return for follow up in one month  Continue your current medications.

## 2014-02-22 DIAGNOSIS — M79605 Pain in left leg: Secondary | ICD-10-CM | POA: Insufficient documentation

## 2014-02-22 DIAGNOSIS — M79604 Pain in right leg: Secondary | ICD-10-CM | POA: Insufficient documentation

## 2014-02-22 NOTE — Assessment & Plan Note (Signed)
Bilateral medial leg pain with ambulation. Musculoskeletal examination of bilateral knees and ankles was unremarkable. Vascular examination was within normal limits however patient does have documented history of coronary artery disease. Symptoms may be consistent with intermittent claudication. Neurologic examination was unremarkable including monofilament exam which makes peripheral neuropathy less likely. -At this time an exercise regimen was discussed with the patient, he is to gradually increase his length of walking over the next month -If patient continues to have significant symptoms that are not improved would consider ABIs

## 2014-02-22 NOTE — Progress Notes (Signed)
   Subjective:    Patient ID: Nathan Richards, male    DOB: 1946/04/14, 68 y.o.   MRN: [id number]  HPI 68 year old male presents for routine medical follow up.Spanish interpreter present  Hypertension-patient currently on losartan and diltiazem, taking medications as prescribed, no missed dosages, no side effects, no chest pain, headaches, no vision changes  Hyperlipidemia-currently on Lipitor, tolerating well, no muscle cramps  Leg pain-patient reports bilateral medial lower leg pain that occurs when ambulating for more than a couple minutes, he denies associated knee pain, no numbness or tingling in his lower extremities, pain is relieved after 5-10 minutes of rest, he gives example of mowing his lawn, he is able to mow for a couple minutes however will then need to rest secondary to the pain, he does take Ultracet as needed for back pain which provided some relief, denies radiation of back pain into lower extremities, no weakness, no saddle anesthesia  CAD-patient denies chest pain, has not needed nitroglycerin  Social-patient is a former smoker   Review of Systems  Constitutional: Negative for fever, chills and fatigue.  Respiratory: Negative for cough and shortness of breath.   Cardiovascular: Negative for chest pain.  Gastrointestinal: Negative for nausea, vomiting and diarrhea.  Musculoskeletal: Positive for back pain.       Objective:   Physical Exam Vitals: Reviewed exam  General: Pleasant Hispanic male, no acute distress, accompanied by interpreter HEENT: Normocephalic, pupils are equal round and reactive to light, extraocular movements are intact, no scleral icterus, nasal septum midline, moist mucous members, uvula midline, no pharyngeal erythema or exudate noted, neck was supple, no anterior posterior cervical lymphadenopathy Cardiac: Regular rate and rhythm, S1 and S2 present, no murmurs, no heaves or thrills Respiratory: Clear to auscultation bilaterally, normal  effort MSK: No joint tenderness to bilateral knees or bilateral ankles, bilateral knee examination was unremarkable with no swelling, no joint line tenderness, no ligament laxity with Lachman/varus/valgus stress testing, bilateral McMurray's testing was negative for meniscal injury Extremities: Trace pedal edema, 2+ dorsalis pedis and posterior tibials pulses bilaterally, good hair growth on toes Neuro: Strength is 5 out of 5 to bilateral knee flexion and extension as well as bilateral foot plantar and dorsiflexion, normal monofilament testing to bilateral feet      Assessment & Plan:  Please see problem specific assessment and plan.

## 2014-02-22 NOTE — Assessment & Plan Note (Signed)
Controlled on current regimen.   

## 2014-02-22 NOTE — Assessment & Plan Note (Signed)
No recent chest pain. Continue to monitor clinically.

## 2014-03-07 ENCOUNTER — Other Ambulatory Visit: Payer: Self-pay | Admitting: Family Medicine

## 2014-03-07 MED ORDER — TRAMADOL-ACETAMINOPHEN 37.5-325 MG PO TABS: ORAL_TABLET | ORAL | Status: DC

## 2014-03-21 ENCOUNTER — Encounter: Payer: Self-pay | Admitting: Family Medicine

## 2014-03-21 ENCOUNTER — Ambulatory Visit (INDEPENDENT_AMBULATORY_CARE_PROVIDER_SITE_OTHER): Payer: Commercial Managed Care - HMO | Admitting: Family Medicine

## 2014-03-21 VITALS — BP 136/86 | HR 56 | Temp 98.1°F | Wt 138.0 lb

## 2014-03-21 DIAGNOSIS — M79605 Pain in left leg: Secondary | ICD-10-CM

## 2014-03-21 DIAGNOSIS — I2510 Atherosclerotic heart disease of native coronary artery without angina pectoris: Secondary | ICD-10-CM

## 2014-03-21 DIAGNOSIS — R351 Nocturia: Secondary | ICD-10-CM

## 2014-03-21 DIAGNOSIS — M79609 Pain in unspecified limb: Secondary | ICD-10-CM

## 2014-03-21 DIAGNOSIS — M79604 Pain in right leg: Secondary | ICD-10-CM

## 2014-03-21 DIAGNOSIS — I10 Essential (primary) hypertension: Secondary | ICD-10-CM

## 2014-03-21 LAB — POCT URINALYSIS DIPSTICK
BILIRUBIN UA: NEGATIVE
GLUCOSE UA: NEGATIVE
Ketones, UA: NEGATIVE
Leukocytes, UA: NEGATIVE
NITRITE UA: NEGATIVE
Protein, UA: 100
RBC UA: NEGATIVE
Spec Grav, UA: 1.02
Urobilinogen, UA: 0.2
pH, UA: 7

## 2014-03-21 LAB — BASIC METABOLIC PANEL
BUN: 21 mg/dL (ref 6–23)
CALCIUM: 10.3 mg/dL (ref 8.4–10.5)
CHLORIDE: 106 meq/L (ref 96–112)
CO2: 28 meq/L (ref 19–32)
CREATININE: 1.51 mg/dL — AB (ref 0.50–1.35)
Glucose, Bld: 94 mg/dL (ref 70–99)
Potassium: 4.2 mEq/L (ref 3.5–5.3)
SODIUM: 141 meq/L (ref 135–145)

## 2014-03-21 LAB — POCT UA - MICROSCOPIC ONLY

## 2014-03-21 MED ORDER — ASPIRIN 81 MG PO TBEC: 81.0000 mg | DELAYED_RELEASE_TABLET | Freq: Every day | ORAL | Status: DC

## 2014-03-21 NOTE — Assessment & Plan Note (Signed)
Improved from previous visit. Patient has increased exercise tolerance from previous visit. Vascular exam was unremarkable. Intermittent leg swelling may be contributing to pain (suspect venous insufficiency, recent kidney/liver function normal, no signs/symptoms of cardiac etiology).  -continue to walk regularly -If swelling continues to be and issue consider trial of compression stockings (would check ABI's first to fully rule out Claudication)

## 2014-03-21 NOTE — Patient Instructions (Addendum)
Blood Pressure - continue current medications  Leg Pain - no further treatment at this time, continue to walk every other day  Increased urination at night - will check urine and blood work, Dr. Ree Kida will call you with the results. Will consider Flomax if lab work unremarkable.

## 2014-03-21 NOTE — Progress Notes (Signed)
   Subjective:    Patient ID: Nathan Richards, male    DOB: [birth year], 68 y.o.   MRN: [id number]  HPI 68 y/o male presents for routine follow up.  HTN - currently on Cardizem and Losartan daily, reports compliance, no chest pain, no lighthededness  Anterior leg pain - improved from last visit (50% improved), patient walking every other day, able to walk for 6-7 minutes then needs to take a rest due to leg pain, walks for close to 50 minutes total each day, reports intermittent leg swelling  Nocturia - urinating 6-7 times per night, weak flow, has feeling of incomplete voiding, during the day will have urge to urinate and will have small amount if incontinence if not able to get to a rest room quickly.   Social - former smoker   Review of Systems  Constitutional: Negative for fever, chills and fatigue.  Respiratory: Negative for cough, choking and shortness of breath.   Cardiovascular: Positive for leg swelling. Negative for chest pain and palpitations.  Gastrointestinal: Negative for nausea, vomiting and diarrhea.  No orthopnea, no PND     Objective:   Physical Exam Vitals: reviewed Gen: pleasant Hispanic male, accompanied by Interpretor HEENT: nomocephalic, PERRL, EOMI, nasal septum midline, MMM Cardiac:RRR, S1 and S2 present, no murmurs, no heaves/thrills Resp: CTAB, normal effort GU: normal male anatomy, bilateral testes descended, patient declines rectal examination Ext: trace pedal edema, 2+ DP and radial pulses bilaterally Skin: good hair growth on bilateral toes  PSA 2010 was 0.32    Assessment & Plan:  Please see problem specific assessment and plan.

## 2014-03-21 NOTE — Assessment & Plan Note (Signed)
Controlled on current regimen.   

## 2014-03-22 ENCOUNTER — Encounter: Payer: Self-pay | Admitting: Family Medicine

## 2014-03-22 ENCOUNTER — Telehealth: Payer: Self-pay | Admitting: Family Medicine

## 2014-03-22 LAB — PSA: PSA: 0.35 ng/mL (ref <=4.00)

## 2014-03-22 MED ORDER — TAMSULOSIN HCL 0.4 MG PO CAPS: 0.4000 mg | ORAL_CAPSULE | Freq: Every day | ORAL | Status: DC

## 2014-03-22 NOTE — Telephone Encounter (Signed)
Notified patient of negative UA and normal PSA. Will attempt trial of Flomax.

## 2014-05-30 ENCOUNTER — Encounter: Payer: Self-pay | Admitting: Family Medicine

## 2014-05-30 ENCOUNTER — Ambulatory Visit (INDEPENDENT_AMBULATORY_CARE_PROVIDER_SITE_OTHER): Payer: Commercial Managed Care - HMO | Admitting: Family Medicine

## 2014-05-30 VITALS — BP 166/88 | HR 71 | Temp 98.0°F | Ht 62.0 in | Wt 138.3 lb

## 2014-05-30 DIAGNOSIS — K219 Gastro-esophageal reflux disease without esophagitis: Secondary | ICD-10-CM

## 2014-05-30 DIAGNOSIS — K2970 Gastritis, unspecified, without bleeding: Secondary | ICD-10-CM

## 2014-05-30 DIAGNOSIS — I10 Essential (primary) hypertension: Secondary | ICD-10-CM

## 2014-05-30 DIAGNOSIS — E785 Hyperlipidemia, unspecified: Secondary | ICD-10-CM

## 2014-05-30 DIAGNOSIS — K2990 Gastroduodenitis, unspecified, without bleeding: Secondary | ICD-10-CM

## 2014-05-30 DIAGNOSIS — R351 Nocturia: Secondary | ICD-10-CM

## 2014-05-30 MED ORDER — LOSARTAN POTASSIUM 50 MG PO TABS: 100.0000 mg | ORAL_TABLET | Freq: Every day | ORAL | Status: DC

## 2014-05-30 MED ORDER — ATORVASTATIN CALCIUM 20 MG PO TABS: 20.0000 mg | ORAL_TABLET | Freq: Every day | ORAL | Status: DC

## 2014-05-30 MED ORDER — ESOMEPRAZOLE MAGNESIUM 40 MG PO CPDR: 40.0000 mg | DELAYED_RELEASE_CAPSULE | Freq: Every day | ORAL | Status: DC

## 2014-05-30 NOTE — Assessment & Plan Note (Signed)
Controlled with daily Nexium and avoidance of spicy foods.

## 2014-05-30 NOTE — Progress Notes (Signed)
   Subjective:    Patient ID: Nathan Richards, male    DOB: [birth year], 68 y.o.   MRN: [id number]  HPI 68 y/o male presents for routine follow up. Accompanied by interpretor.  HTN - currently on Diltiazem and Losartan, taking daily, no side effects, has not taken AM dose today, no chest pain, no headaches, no sob, no vision changes  Nocturia - patient had nocturia at last visit, this has resolved, he was started on Flomax however only took for a few days, now urinates 1-2 times per day, denies decreased urine flow  GERD - on Nexium, patient states controlled if he avoids spicy foods, no current abdominal pain/nausea/emesis  Social - former smoker   Review of Systems  Constitutional: Negative for fever, chills and fatigue.  Respiratory: Negative for cough and shortness of breath.   Cardiovascular: Negative for chest pain.  Gastrointestinal: Negative for nausea, vomiting and diarrhea.       Objective:   Physical Exam Vitals: Blood Pressure 192/88 (taken at 10:29 AM however filed at 11:02), recheck 166/88 at 11:00 Gen: pleasant male, NAD HEENT: normocephalic, PERRL, EOMI, no scleral icterus, nasal septum midline, MMM, uvula midline, neck supple Cardiac: RRR, S1 and S2 present, no murmurs, no heaves/thrills Resp: CTAB, normal effort Ext: no edema Abd: soft, no tenderness, normal bowel sounds, no rebound, no guarding  Reviewed BMP and PSA from last visit in 02/2014    Assessment & Plan:  Please see problem specific assessment and plan.

## 2014-05-30 NOTE — Assessment & Plan Note (Signed)
Resolved. Patient no longer taking Flomax. Monitor clinically.

## 2014-05-30 NOTE — Patient Instructions (Addendum)
Acid Reflux - continue Nexium  Blood Pressure - elevated today, improved on recheck, take blood pressure medications when you get home, return later this week for a nurse visit to recheck you blood pressure  Increased urine frequency - resolved, please let me know if this returns

## 2014-05-30 NOTE — Assessment & Plan Note (Signed)
Elevated today. Improved on recheck. No reg flag symptoms. Likely due to patient not taking AM dose of BP medications today. -patient to return for nurse check of BP in 2-3 days -if elevated will need to increase current therapy, consider repeat BMP to monitor kidney function (Cr stable on check in 02/2014)

## 2014-06-02 ENCOUNTER — Ambulatory Visit (INDEPENDENT_AMBULATORY_CARE_PROVIDER_SITE_OTHER): Payer: Commercial Managed Care - HMO | Admitting: *Deleted

## 2014-06-02 VITALS — BP 170/90 | HR 65

## 2014-06-02 DIAGNOSIS — I10 Essential (primary) hypertension: Secondary | ICD-10-CM

## 2014-06-02 NOTE — Progress Notes (Signed)
   Pt in nurse clinic for spanish interpreter for blood pressure check.  BP 170/90 manually, heart rate 65.  Pt denies any chest pain, SOB, dizziness,  Visual changes or numbness/tingling of extremities.  Pt has taken medications as prescribed.  Pt advise to go to ED/urgent care over the weekend if he develops any of the symptoms listed above. Pt also advised to go to [facility] or one of the pharmacy to check blood pressure.  Pt to follow up with PCP.  Will forward to PCP.  Derl Barrow, RN

## 2014-06-05 ENCOUNTER — Telehealth: Payer: Self-pay | Admitting: Family Medicine

## 2014-06-05 NOTE — Telephone Encounter (Signed)
Note to nursing staff - please call patient to set up follow up appointment for BP check and discussion on increasing BP medications.

## 2014-06-06 NOTE — Telephone Encounter (Signed)
Message left for Patient via Aquilla Solian, CMA to call back and schedule an appointment.

## 2014-06-17 ENCOUNTER — Other Ambulatory Visit: Payer: Self-pay | Admitting: Family Medicine

## 2014-06-19 ENCOUNTER — Other Ambulatory Visit: Payer: Self-pay | Admitting: Family Medicine

## 2014-06-19 MED ORDER — TRAMADOL-ACETAMINOPHEN 37.5-325 MG PO TABS: ORAL_TABLET | ORAL | Status: DC

## 2014-07-28 ENCOUNTER — Other Ambulatory Visit: Payer: Self-pay | Admitting: *Deleted

## 2014-07-28 ENCOUNTER — Other Ambulatory Visit: Payer: Self-pay | Admitting: Family Medicine

## 2014-07-28 MED ORDER — TRAMADOL-ACETAMINOPHEN 37.5-325 MG PO TABS: ORAL_TABLET | ORAL | Status: DC

## 2014-07-28 NOTE — Telephone Encounter (Signed)
Received a fax refill request for Tramadol/APAP 37.5-325 mg tab.  Form placed in provider for review.  Derl Barrow, RN

## 2014-07-28 NOTE — Telephone Encounter (Signed)
Note to nursing staff - please call in Ultracet 37.5-325 one tablet PO Q6 PRN pain, dispense #60, refill #0, thanks

## 2014-08-01 ENCOUNTER — Other Ambulatory Visit: Payer: Self-pay | Admitting: *Deleted

## 2014-08-02 NOTE — Telephone Encounter (Signed)
Rx called into [facility].

## 2015-05-04 DIAGNOSIS — L258 Unspecified contact dermatitis due to other agents: Secondary | ICD-10-CM | POA: Diagnosis not present

## 2017-01-27 ENCOUNTER — Ambulatory Visit (INDEPENDENT_AMBULATORY_CARE_PROVIDER_SITE_OTHER): Payer: Medicare Other | Admitting: Urgent Care

## 2017-01-27 ENCOUNTER — Encounter: Payer: Self-pay | Admitting: Urgent Care

## 2017-01-27 VITALS — BP 182/84 | HR 72 | Temp 98.2°F | Resp 16 | Ht 63.25 in | Wt 120.0 lb

## 2017-01-27 DIAGNOSIS — R0600 Dyspnea, unspecified: Secondary | ICD-10-CM

## 2017-01-27 DIAGNOSIS — Z955 Presence of coronary angioplasty implant and graft: Secondary | ICD-10-CM

## 2017-01-27 DIAGNOSIS — I2510 Atherosclerotic heart disease of native coronary artery without angina pectoris: Secondary | ICD-10-CM

## 2017-01-27 DIAGNOSIS — I10 Essential (primary) hypertension: Secondary | ICD-10-CM | POA: Diagnosis not present

## 2017-01-27 DIAGNOSIS — R030 Elevated blood-pressure reading, without diagnosis of hypertension: Secondary | ICD-10-CM | POA: Diagnosis not present

## 2017-01-27 DIAGNOSIS — R0602 Shortness of breath: Secondary | ICD-10-CM | POA: Diagnosis not present

## 2017-01-27 LAB — POCT URINALYSIS DIP (MANUAL ENTRY)
Bilirubin, UA: NEGATIVE
Glucose, UA: NEGATIVE mg/dL
Ketones, POC UA: NEGATIVE mg/dL
LEUKOCYTES UA: NEGATIVE
Nitrite, UA: NEGATIVE
PH UA: 5.5 (ref 5.0–8.0)
Spec Grav, UA: >=1.03 — AB (ref 1.010–1.025)
UROBILINOGEN UA: 0.2 U/dL

## 2017-01-27 MED ORDER — METOPROLOL TARTRATE 50 MG PO TABS: 50.0000 mg | ORAL_TABLET | Freq: Two times a day (BID) | ORAL | 3 refills | Status: DC

## 2017-01-27 MED ORDER — ASPIRIN 81 MG PO TABS: 81.0000 mg | ORAL_TABLET | Freq: Every day | ORAL | 3 refills | Status: DC

## 2017-01-27 MED ORDER — LOSARTAN POTASSIUM 50 MG PO TABS: 50.0000 mg | ORAL_TABLET | Freq: Every day | ORAL | 1 refills | Status: DC

## 2017-01-27 NOTE — Progress Notes (Signed)
MRN: [id number] DOB: [birth year]  Subjective:   Nathan Richards is a 71 y.o. male presenting for chief complaint of Breathing Problem (x 2 months)  Reports 2 month history of intermittent shob, dyspnea while climbing stairs or becoming active. Patient is currently living on his own, he was under a lot of stress while living with his son. He is taking care of himself. Patient has a history of PCI, without an MI. This was done in 2005. He has not had f/u, maintained his health care in the past 2-3 years. Denies dizziness, chronic headache, blurred vision, chest pain, shortness of breath, heart racing, palpitations, nausea, vomiting, abdominal pain, hematuria, lower leg swelling. Denies smoking cigarettes or drinking alcohol.   Nathan Richards is not currently taking any medications. Also is allergic to ace inhibitors; amlodipine besylate; beta adrenergic blockers; and hydrochlorothiazide. Nathan Richards  has a past medical history of Urolithiasis. Also  has a past surgical history that includes Knee arthroscopy; Coronary angioplasty with stent (02/05/2004); Inguinal hernia repair; and Umbilical hernia repair. His family history includes Coronary artery disease in his father and mother; Diabetes in his brother, brother, father, and mother.   Objective:   Vitals: BP (!) 182/84 (BP Location: Right Arm, Patient Position: Sitting, Cuff Size: Normal)   Pulse 72   Temp 98.2 F (36.8 C) (Oral)   Resp 16   Ht 5' 3.25" (1.607 m)   Wt 120 lb (54.4 kg)   SpO2 99%   BMI 21.09 kg/m   BP Readings from Last 3 Encounters:  01/27/17 (!) 182/84  06/02/14 (!) 170/90  05/30/14 (!) 166/88   Physical Exam  Constitutional: He is oriented to person, place, and time. He appears well-developed and well-nourished.  HENT:  Mouth/Throat: Oropharynx is clear and moist.  Eyes: No scleral icterus.  Neck: Normal range of motion. Neck supple. No JVD present.  Cardiovascular: Normal rate, regular rhythm and intact distal pulses.  Exam  reveals no gallop and no friction rub.   No murmur heard. Pulmonary/Chest: No respiratory distress. He has no wheezes. He has no rales.  Abdominal: Soft. Bowel sounds are normal. He exhibits no distension and no mass. There is no tenderness. There is no guarding.  Musculoskeletal: He exhibits no edema.  Neurological: He is alert and oriented to person, place, and time.  Skin: Skin is warm and dry.  Psychiatric: He has a normal mood and affect.   ECG interpretation - no acute process. There is t-wave inversion in leads V3-V6.   Results for orders placed or performed in visit on 01/27/17 (from the past 24 hour(s))  POCT urinalysis dipstick     Status: Abnormal   Collection Time: 01/27/17 11:51 AM  Result Value Ref Range   Color, UA yellow yellow   Clarity, UA clear clear   Glucose, UA negative negative mg/dL   Bilirubin, UA negative negative   Ketones, POC UA negative negative mg/dL   Spec Grav, UA >=1.030 (A) 1.010 - 1.025   Blood, UA large (A) negative   pH, UA 5.5 5.0 - 8.0   Protein Ur, POC >=300 (A) negative mg/dL   Urobilinogen, UA 0.2 0.2 or 1.0 E.U./dL   Nitrite, UA Negative Negative   Leukocytes, UA Negative Negative   Assessment and Plan :   This case was precepted with Dr. Nolon Rod.   1. Shortness of breath 2. Dyspnea, unspecified type 3. History of heart artery stent 4. Essential hypertension 5. Elevated blood pressure reading 6. Coronary artery disease involving native heart without angina  pectoris, unspecified vessel or lesion type - Start losartan, metoprolol, aspirin. Labs pending. Return-to-clinic precautions discussed, patient verbalized understanding. Follow up in 3 days.  - Ambulatory referral to Cardiology   Jaynee Eagles, PA-C [facility] [phone] 01/27/2017  10:37 AM

## 2017-01-27 NOTE — Patient Instructions (Addendum)
Hipertensin Hypertension La hipertensin, conocida comnmente como presin arterial alta, se produce cuando la sangre bombea en las arterias con mucha fuerza. Las arterias son los vasos sanguneos que transportan la sangre desde el corazn al resto del cuerpo. La hipertensin hace que el corazn haga ms esfuerzo para Insurance account manager y Sears Holdings Corporation que las arterias se Armed forces training and education officer o Multimedia programmer. La hipertensin no tratada o no controlada puede causar infarto de miocardio, accidentes cerebrovasculares, enfermedad renal y otros problemas. Una lectura de la presin arterial consiste de un nmero ms alto sobre un nmero ms bajo. En condiciones ideales, la presin arterial debe estar por debajo de 120/80. El primer nmero ("superior") es la presin sistlica. Es la medida de la presin de las arterias cuando el corazn late. El segundo nmero ("inferior") es la presin diastlica. Es la medida de la presin en las arterias cuando el corazn se relaja. Cules son las causas? Se desconoce la causa de esta afeccin. Qu incrementa el riesgo? Algunos factores de riesgo de hipertensin estn bajo su control. Otros no. Factores que puede Omnicom.  Tener diabetes mellitus tipo 2, colesterol alto, o ambos.  No hacer la cantidad suficiente de actividad fsica o ejercicio.  Tener sobrepeso.  Consumir mucha grasa, azcar, caloras o sal (sodio) en su dieta.  Beber alcohol en exceso. Factores que son difciles o imposibles de modificar  Tener enfermedad renal crnica.  Tener antecedentes familiares de presin arterial alta.  La edad. Los riesgos aumentan con la edad.  La raza. El riesgo es mayor para las Statistician.  El sexo. Antes de los 45aos, los hombres corren ms [employer]. Despus de los 65aos, las mujeres corren ms [employer].  Tener apnea obstructiva del sueo.  El estrs. Cules son los signos o los sntomas? La presin arterial  extremadamente alta (crisis hipertensiva) puede provocar:  Dolor de [place].  Ansiedad.  Falta de aire.  Hemorragia nasal.  Nuseas y vmitos.  Dolor de pecho intenso.  Una crisis de movimientos que no puede controlar (convulsiones).  Cmo se diagnostica? Esta afeccin se diagnostica midiendo su presin arterial mientras se encuentra sentado, con el brazo apoyado sobre una superficie. El brazalete del tensimetro debe colocarse directamente sobre la piel de la parte superior del brazo y al nivel de su corazn. Debe medirla al [facility] veces en el mismo brazo. Determinadas condiciones pueden causar una diferencia de presin arterial entre el brazo izquierdo y Aeronautical engineer. Ciertos factores pueden provocar que las lecturas de la presin arterial sean inferiores o superiores a lo normal (elevadas) por un perodo corto de tiempo:  Si su presin arterial es ms alta cuando se encuentra en el consultorio del mdico que cuando la mide en su hogar, se denomina "hipertensin de bata blanca". La Harley-Davidson de las personas que tienen esta afeccin no deben ser Engelhard Corporation.  Si su presin arterial es ms alta en el hogar que cuando se encuentra en el consultorio del mdico, se denomina "hipertensin enmascarada". La Harley-Davidson de las personas que tienen esta afeccin deben ser medicadas para Chief Operating Officer la presin arterial.  Si tiene una lecturas de presin arterial alta durante una visita o si tiene presin arterial normal con otros factores de riesgo:  Es posible que se le pida que regrese Banker para volver a Chief Operating Officer su presin arterial.  Se le puede pedir que se controle la presin arterial en su casa durante 1 semana o ms.  Si se le diagnostica hipertensin, es posible que  se le realicen otros anlisis de sangre o estudios de diagnstico por imgenes para ayudar a su mdico a comprender su riesgo general de tener otras afecciones. Cmo se trata? Esta afeccin se trata haciendo cambios saludables  en el estilo de vida, tales como ingerir alimentos saludables, realizar ms ejercicio y reducir el consumo de alcohol. El mdico puede recetarle medicamentos si los cambios en el estilo de vida no son suficientes para Museum/gallery curator la presin arterial y si:  Su presin arterial sistlica est por encima de 130.  Su presin arterial diastlica est por encima de 80.  La presin arterial deseada puede variar en funcin de las enfermedades, la edad y otros factores personales. Siga estas instrucciones en su casa: Comida y bebida  Siga una dieta con alto contenido de fibras y Thomson, y con bajo contenido de sodio, International aid/development worker agregada y Neurosurgeon. Un ejemplo de plan alimenticio es la dieta DASH (Dietary Approaches to Stop Hypertension, Mtodos alimenticios para detener la hipertensin). Para alimentarse de esta manera: ? Coma mucha fruta y verdura fresca. Trate de que la mitad del plato de cada comida sea de frutas y verduras. ? Coma cereales integrales, como pasta integral, arroz integral y pan integral. Llene aproximadamente un cuarto del plato con cereales integrales. ? Coma y beba productos lcteos con bajo contenido de grasa, como leche descremada o yogur bajo en grasas. ? Evite la ingesta de cortes de carne grasa, carne procesada o curada, y carne de ave con piel. Llene aproximadamente un cuarto del plato con protenas magras, como pescado, pollo sin piel, frijoles, huevos y tofu. ? Evite ingerir alimentos prehechos o procesados. En general, estos tienen mayor cantidad de sodio, azcar agregada y Steffanie [place].  Reduzca su ingesta diaria de sodio. La mayora de las personas que tienen hipertensin deben comer menos de 1500 mg de sodio por [employer].  Limite el consumo de alcohol a no ms de 1 medida por da si es mujer y no est Orthoptist y a 2 medidas por da si es hombre. Una medida equivale a 12onzas de cerveza, 5onzas de vino o 1onzas de bebidas alcohlicas de alta graduacin. Estilo de vida  Trabaje  con su mdico para mantener un peso saludable o Curator. Pregntele cual es su peso recomendado.  Realice al menos 30 minutos de ejercicio que haga que se acelere su corazn (ejercicio Magazine features editor) la DIRECTV de la [place]. Estas actividades pueden incluir caminar, nadar o andar en bicicleta.  Incluya ejercicios para fortalecer sus msculos (ejercicios de resistencia), como pilates o levantamiento de pesas, como parte de su rutina semanal de ejercicios. Intente realizar de este tipo de ejercicios al Kellogg a la [place].  No consuma ningn producto que contenga nicotina o tabaco, como cigarrillos y Administrator, Civil Service. Si necesita ayuda para dejar de fumar, consulte al mdico.  Contrlese la presin arterial en su casa segn las indicaciones del mdico.  Concurra a todas las visitas de control como se lo haya indicado el mdico. Esto es importante. Medicamentos  Baxter International de venta libre y los recetados solamente como se lo haya indicado el mdico. Siga cuidadosamente las indicaciones. Los medicamentos para la presin arterial deben tomarse segn las indicaciones.  No omita las dosis de medicamentos para la presin arterial. Si lo hace, estar en riesgo de tener problemas y puede hacer que los medicamentos sean menos eficaces.  Pregntele a su mdico a qu efectos secundarios o reacciones a los Museum/gallery curator. Comunquese con  un mdico si:  Piensa que tiene una reaccin a un medicamento que est tomando.  Tiene dolores de cabeza frecuentes (recurrentes).  Siente mareos.  Tiene hinchazn en los tobillos.  Tiene problemas de visin. Solicite ayuda de inmediato si:  Siente un dolor de cabeza intenso o confusin.  Siente debilidad inusual o adormecimiento.  Siente que va a desmayarse.  Siente un dolor intenso en el pecho o el abdomen.  Vomita repetidas veces.  Tiene dificultad para respirar. Resumen  La  hipertensin se produce cuando la sangre bombea en las arterias con mucha fuerza. Si esta afeccin no se controla, podra correr riesgo de tener complicaciones graves.  La presin arterial deseada puede variar en funcin de las enfermedades, la edad y otros factores personales. Para la Comcast, una presin arterial normal es menor que 120/80.  La hipertensin se trata con cambios en el estilo de vida, medicamentos o una combinacin de [place]. Los McDonald's Corporation estilo de vida incluyen prdida de peso, ingerir alimentos sanos, seguir una dieta baja en sodio, hacer ms ejercicio y Environmental consultant consumo de alcohol. Esta informacin no tiene Marine scientist el consejo del mdico. Asegrese de hacerle al mdico cualquier pregunta que tenga. Document Released: 08/11/2005 Document Revised: 07/23/2016 Document Reviewed: 07/23/2016 Elsevier Interactive Patient Education  2018 [employer].     IF you received an x-ray today, you will receive an invoice from [facility]. Please contact [facility] Radiology at [phone] with questions or concerns regarding your invoice.   IF you received labwork today, you will receive an invoice from [place]. Please contact [facility] at [phone] with questions or concerns regarding your invoice.   Our billing staff will not be able to assist you with questions regarding bills from these companies.  You will be contacted with the lab results as soon as they are available. The fastest way to get your results is to activate your My Chart account. Instructions are located on the last page of this paperwork. If you have not heard from [place] regarding the results in 2 weeks, please contact this office.

## 2017-01-28 ENCOUNTER — Encounter: Payer: Self-pay | Admitting: Emergency Medicine

## 2017-01-28 ENCOUNTER — Ambulatory Visit (INDEPENDENT_AMBULATORY_CARE_PROVIDER_SITE_OTHER): Payer: Medicare Other | Admitting: Emergency Medicine

## 2017-01-28 VITALS — BP 140/84 | HR 61 | Temp 98.3°F | Resp 16 | Ht 63.5 in | Wt 121.2 lb

## 2017-01-28 DIAGNOSIS — C9110 Chronic lymphocytic leukemia of B-cell type not having achieved remission: Secondary | ICD-10-CM

## 2017-01-28 DIAGNOSIS — N189 Chronic kidney disease, unspecified: Secondary | ICD-10-CM | POA: Diagnosis not present

## 2017-01-28 DIAGNOSIS — I2510 Atherosclerotic heart disease of native coronary artery without angina pectoris: Secondary | ICD-10-CM | POA: Diagnosis not present

## 2017-01-28 DIAGNOSIS — N183 Chronic kidney disease, stage 3 unspecified: Secondary | ICD-10-CM | POA: Insufficient documentation

## 2017-01-28 DIAGNOSIS — C9190 Lymphoid leukemia, unspecified not having achieved remission: Secondary | ICD-10-CM

## 2017-01-28 LAB — COMPREHENSIVE METABOLIC PANEL
ALT: 9 IU/L (ref 0–44)
AST: 21 IU/L (ref 0–40)
Albumin/Globulin Ratio: 1.2 (ref 1.2–2.2)
Albumin: 3.4 g/dL — ABNORMAL LOW (ref 3.6–4.8)
Alkaline Phosphatase: 101 IU/L (ref 39–117)
BILIRUBIN TOTAL: 0.3 mg/dL (ref 0.0–1.2)
BUN/Creatinine Ratio: 11 (ref 10–24)
BUN: 35 mg/dL — ABNORMAL HIGH (ref 8–27)
CALCIUM: 9.5 mg/dL (ref 8.6–10.2)
CO2: 24 mmol/L (ref 18–29)
Chloride: 105 mmol/L (ref 96–106)
Creatinine, Ser: 3.12 mg/dL (ref 0.76–1.27)
GFR calc non Af Amer: 19 mL/min/{1.73_m2} — ABNORMAL LOW (ref >59)
GFR, EST AFRICAN AMERICAN: 22 mL/min/{1.73_m2} — AB (ref >59)
GLUCOSE: 160 mg/dL — AB (ref 65–99)
Globulin, Total: 2.8 g/dL (ref 1.5–4.5)
Potassium: 4 mmol/L (ref 3.5–5.2)
Sodium: 140 mmol/L (ref 134–144)
Total Protein: 6.2 g/dL (ref 6.0–8.5)

## 2017-01-28 LAB — CBC
HEMATOCRIT: 32.1 % — AB (ref 37.5–51.0)
HEMOGLOBIN: 10.8 g/dL — AB (ref 13.0–17.7)
MCH: 31.1 pg (ref 26.6–33.0)
MCHC: 33.6 g/dL (ref 31.5–35.7)
MCV: 93 fL (ref 79–97)
Platelets: 113 10*3/uL — ABNORMAL LOW (ref 150–379)
RBC: 3.47 x10E6/uL — AB (ref 4.14–5.80)
RDW: 14.2 % (ref 12.3–15.4)
WBC: 23.5 10*3/uL (ref 3.4–10.8)

## 2017-01-28 LAB — BRAIN NATRIURETIC PEPTIDE: BNP: 579 pg/mL — AB (ref 0.0–100.0)

## 2017-01-28 NOTE — Patient Instructions (Addendum)
     IF you received an x-ray today, you will receive an invoice from [facility]. Please contact [facility]iology at [phone] with questions or concerns regarding your invoice.   IF you received labwork today, you will receive an invoice from [place]. Please contact [facility] at [phone] with questions or concerns regarding your invoice.   Our billing staff will not be able to assist you with questions regarding bills from these companies.  You will be contacted with the lab results as soon as they are available. The fastest way to get your results is to activate your My Chart account. Instructions are located on the last page of this paperwork. If you have not heard from [place] regarding the results in 2 weeks, please contact this office.     Enfermedad renal crnica en los adultos (Chronic Kidney Disease, Adult) La enfermedad renal crnica se produce cuando los riones sufren un dao durante un perodo de 67meses o ms. Los riones son dos rganos que cumplen muchas funciones importantes en el organismo. Estas funciones incluyen las siguientes:  Eliminar desechos y el exceso de [place].  Producir hormonas que [employer]ne[employer] cantidad de lquido Devon Energy tejidos y los vasos sanguneos.  Mantener la cantidad correcta de lquidos y de sustancias qumicas en el organismo. [place] veces, esta afeccin no desaparece, pero a menudo puede ser Pep[employer] Holdings. Se deben tomar medidas para frenar el dao renal o evitar que empeore. Walt Disney, los riones pueden dejar de funcionar. CUIDADOS EN EL HOGAR  Siga su dieta segn las indicaciones de su mdico. Tal vez deba evitar el alcohol, los alimentos salados (sodio) y aquellos con alto contenido de potasio, calcio y protenas.  Tome los medicamentos de venta libre y los recetados solamente como se lo haya indicado el mdico. No tome ningn medicamento nuevo a menos que el mdico lo autorice. [place] y los minerales. ? Los medicamentos y los suplementos nutricionales pueden agravar el dao renal. ? Es posible que el mdico deba modificar la cantidad de medicamentos que toma.  No use productos que contengan tabaco. Estos incluyen cigarrillos, tabaco para mascar y Psychologist, sport and exercise. Si necesita ayuda para dejar de fumar, consulte al mdico.  Concurra a todas las visitas de control como se lo haya indicado el mdico. Esto es importante.  Controlar su presin arterial. Informe al mdico si tiene cambios en la presin arterial.  Alcance un peso saludable. Mantenga ese peso. Si necesita ayuda para lograrlo, consulte al mdico.  Comience o contine un plan de ejercicios. Intente hacer ejercicios al menos 49minutos al da, 5das a la semana.  Mantngase al da con las vacunas como se lo haya indicado el mdico.  SOLICITE AYUDA SI:  Los sntomas empeoran.  Aparecen nuevos sntomas.  SOLICITE AYUDA DE INMEDIATO SI:  Tiene sntomas de enfermedad renal terminal. Estos incluyen los siguientes: ? Dolores de [place]. ? Piel ms oscura o ms clara que lo normal. ? Adormecimiento de las manos o de los pies. ? Aparecen hematomas con facilidad. ? Hipo frecuente. ? Dolor en el pecho. ? Falta de aire. ? Ausencia de la The PNC Financial.  Tiene fiebre.  Orina muy [place] dolor o sangra al [place].  Esta informacin no tiene Marine scientist el consejo del mdico. Asegrese de hacerle al mdico cualquier pregunta que tenga. Document Released: 09/13/2010 Document Revised: 12/03/2015 Document Reviewed: 04/09/2012 Elsevier Interactive Patient Education  2017 Reynolds American.

## 2017-01-28 NOTE — Progress Notes (Signed)
Nathan Richards 71 y.o.   Chief Complaint  Patient presents with  . abnormal labs    HISTORY OF PRESENT ILLNESS: This is a 71 y.o. male seen here yesterday; blood work shows increased WBC's and decreased GFR; further review shows pt has h/o CLL; denies fever or chills; has increased BUN/Cr. Denies uremic symptoms; still urinates significant amounts. Denies SOB or chest pain; BP has been high; on no meds. Could not find record of Hematology consult. Denies bleeding.  HPI   Prior to Admission medications   Medication Sig Start Date End Date Taking? Authorizing Provider  aspirin 81 MG tablet Take 1 tablet (81 mg total) by mouth daily. 01/27/17  Yes Wallis Bamberg, PA-C  losartan (COZAAR) 50 MG tablet Take 1 tablet (50 mg total) by mouth daily. 01/27/17  Yes Wallis Bamberg, PA-C  metoprolol tartrate (LOPRESSOR) 50 MG tablet Take 1 tablet (50 mg total) by mouth 2 (two) times daily. 01/27/17  Yes Wallis Bamberg, PA-C    Allergies  Allergen Reactions  . Ace Inhibitors     REACTION: cough  . Amlodipine Besylate     REACTION: Impotence  . Beta Adrenergic Blockers     REACTION: Impotence  . Hydrochlorothiazide Other (See Comments)    May have contributed to his hypercalcemia    Patient Active Problem List   Diagnosis Date Noted  . CLL (chronic lymphocytic leukemia) (HCC) 01/28/2017  . Chronic renal failure 01/28/2017  . Nocturia 03/21/2014  . Bilateral leg pain 02/22/2014  . Eustachian tube dysfunction 10/21/2013  . Cough 10/04/2013  . Vision changes 09/27/2013  . Noncompliance with medications 09/27/2013  . Acquired blepharoptosis 04/19/2013  . Gastritis and gastroduodenitis 03/14/2013  . CHRONIC KIDNEY DISEASE STAGE III (MODERATE) 02/11/2010  . Other ganglion and cyst of synovium, tendon, and bursa(727.49) 02/08/2010  . 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 CLL 07/26/2007  . ERECTILE DYSFUNCTION, SECONDARY TO MEDICATION 11/16/2006  . HYPERLIPIDEMIA 10/22/2006  . SCHIZOPHRENIA 10/22/2006   . DEPRESSION, MAJOR, RECURRENT 10/22/2006  . HEARING LOSS NOS OR DEAFNESS 10/22/2006  . HYPERTENSION, BENIGN SYSTEMIC 10/22/2006  . ASCVD 10/22/2006  . OSTEOARTHRITIS, MULTI SITES 10/22/2006    Past Medical History:  Diagnosis Date  . Urolithiasis    cystoscopy in 2002    Past Surgical History:  Procedure Laterality Date  . CORONARY ANGIOPLASTY WITH STENT PLACEMENT  02/05/2004   drug eluting to LAD plus baloon 1st diagonal  . INGUINAL HERNIA REPAIR     bilateral  . KNEE ARTHROSCOPY     right, done in [place]  . UMBILICAL HERNIA REPAIR      Social History   Social History  . Marital status: Married    Spouse name: N/A  . Number of children: N/A  . Years of education: N/A   Occupational History  .  Unemployed   Social History Main Topics  . Smoking status: Former Smoker    Years: 31.00    Types: Cigarettes    Quit date: 12/22/1997  . Smokeless tobacco: Never Used  . Alcohol use No  . Drug use: No     Comment: remote use of MJ and cocaine  . Sexual activity: No   Other Topics Concern  . Not on file   Social History Narrative   Born [place] lived in [place]. For years before moving to [place]   Married, currently going through a divorce. Wife is in [place]   Lives alone    He has a son living here from a prior  relationship   He and his current wife have a daughter and son together   Another son was killed at age 31   2 grand daughters in [place], [place], [place] lives locally   Disabled by psychiatric disease.    [employer] member    Family History  Problem Relation Age of Onset  . Coronary artery disease Father        died  . Diabetes Father   . Diabetes Mother   . Coronary artery disease Mother   . Diabetes Brother   . Diabetes Brother        died     Review of Systems  Constitutional: Negative.  Negative for chills, fever, malaise/fatigue and weight loss.  HENT: Negative.  Negative for congestion, ear pain, nosebleeds and  sore throat.   Eyes: Negative.  Negative for blurred vision, double vision, discharge and redness.  Respiratory: Negative.  Negative for cough, hemoptysis and shortness of breath.   Cardiovascular: Negative for chest pain, palpitations and leg swelling.  Gastrointestinal: Negative for abdominal pain, diarrhea, nausea and vomiting.  Genitourinary: Negative for dysuria and hematuria.  Musculoskeletal: Negative for back pain, myalgias and neck pain.  Skin: Negative.  Negative for rash.  Neurological: Negative for dizziness, sensory change, speech change, focal weakness and headaches.  Endo/Heme/Allergies: Negative.   All other systems reviewed and are negative.  Vitals:   01/28/17 1216 01/28/17 1321  BP: (!) 158/80 140/84  Pulse:    Resp:    Temp:       Physical Exam  Constitutional: He is oriented to person, place, and time. He appears well-developed and well-nourished.  HENT:  Head: Normocephalic.  Nose: Nose normal.  Mouth/Throat: Oropharynx is clear and moist. No oropharyngeal exudate.  Eyes: Conjunctivae and EOM are normal. Pupils are equal, round, and reactive to light.  Neck: Normal range of motion. Neck supple. No JVD present. No thyromegaly present.  Cardiovascular: Normal rate, regular rhythm, normal heart sounds and intact distal pulses.   Pulmonary/Chest: Effort normal and breath sounds normal.  Abdominal: Soft. Bowel sounds are normal. He exhibits no distension. There is no tenderness.  Musculoskeletal: Normal range of motion.  Lymphadenopathy:    He has no cervical adenopathy.  Neurological: He is alert and oriented to person, place, and time. No sensory deficit. He exhibits normal muscle tone.  Skin: Skin is warm and dry. Capillary refill takes less than 2 seconds. No rash noted.  Psychiatric: He has a normal mood and affect. His behavior is normal.  Vitals reviewed.    ASSESSMENT & PLAN: Barnett was seen today for abnormal labs.  Diagnoses and all orders for  this visit:  Chronic renal failure, unspecified CKD stage -     Ambulatory referral to Nephrology  CLL (chronic lymphocytic leukemia) ([place]) -     Ambulatory referral to Hematology  Pt has worsening renal failure but has no signs or symptoms of uremia or volume overload, is not acidotic or hyperkalemic; not febrile. Needs both Nephrology and Hematology consults.  Patient Instructions       IF you received an x-ray today, you will receive an invoice from [facility] Radiology. Please contact [facility] Radiology at [phone] with questions or concerns regarding your invoice.   IF you received labwork today, you will receive an invoice from [place]. Please contact [facility] at [phone] with questions or concerns regarding your invoice.   Our billing staff will not be able to assist you with questions regarding bills  from these companies.  You will be contacted with the lab results as soon as they are available. The fastest way to get your results is to activate your My Chart account. Instructions are located on the last page of this paperwork. If you have not heard from [place] regarding the results in 2 weeks, please contact this office.     Enfermedad renal crnica en los adultos (Chronic Kidney Disease, Adult) La enfermedad renal crnica se produce cuando los riones sufren un dao durante un perodo de 38meses o ms. Los riones son dos rganos que cumplen muchas funciones importantes en el organismo. Estas funciones incluyen las siguientes:  Eliminar desechos y el exceso de [place].  Producir hormonas que Dynegy cantidad de lquido Devon Energy tejidos y los vasos sanguneos.  Mantener la cantidad correcta de lquidos y de sustancias qumicas en el organismo. [place] veces, esta afeccin no desaparece, pero a menudo puede ser Pepco Holdings. Se deben tomar medidas para frenar el dao renal o evitar que empeore. Walt Disney, los riones pueden dejar de  funcionar. CUIDADOS EN EL HOGAR  Siga su dieta segn las indicaciones de su mdico. Tal vez deba evitar el alcohol, los alimentos salados (sodio) y aquellos con alto contenido de potasio, calcio y protenas.  Tome los medicamentos de venta libre y los recetados solamente como se lo haya indicado el mdico. No tome ningn medicamento nuevo a menos que el mdico lo autorice. [place] y los minerales. ? Los medicamentos y los suplementos nutricionales pueden agravar el dao renal. ? Es posible que el mdico deba modificar la cantidad de medicamentos que toma.  No use productos que contengan tabaco. Estos incluyen cigarrillos, tabaco para mascar y Psychologist, sport and exercise. Si necesita ayuda para dejar de fumar, consulte al mdico.  Concurra a todas las visitas de control como se lo haya indicado el mdico. Esto es importante.  Controlar su presin arterial. Informe al mdico si tiene cambios en la presin arterial.  Alcance un peso saludable. Mantenga ese peso. Si necesita ayuda para lograrlo, consulte al mdico.  Comience o contine un plan de ejercicios. Intente hacer ejercicios al menos 109minutos al da, 5das a la semana.  Mantngase al da con las vacunas como se lo haya indicado el mdico.  SOLICITE AYUDA SI:  Los sntomas empeoran.  Aparecen nuevos sntomas.  SOLICITE AYUDA DE INMEDIATO SI:  Tiene sntomas de enfermedad renal terminal. Estos incluyen los siguientes: ? Dolores de [place]. ? Piel ms oscura o ms clara que lo normal. ? Adormecimiento de las manos o de los pies. ? Aparecen hematomas con facilidad. ? Hipo frecuente. ? Dolor en el pecho. ? Falta de aire. ? Ausencia de la [employer] PNC [employer].  Tiene fiebre.  Orina muy [place] dolor o sangra al [place].  Esta informacin no tiene Marine scientist el consejo del mdico. Asegrese de hacerle al mdico cualquier pregunta que tenga. Document Released: 09/13/2010 Document  Revised: 12/03/2015 Document Reviewed: 04/09/2012 Elsevier Interactive Patient Education  2017 Elsevier Inc.      Agustina Caroli, MD [facility]

## 2017-01-30 ENCOUNTER — Ambulatory Visit: Payer: Medicare Other | Admitting: Urgent Care

## 2017-02-05 DIAGNOSIS — R809 Proteinuria, unspecified: Secondary | ICD-10-CM | POA: Diagnosis not present

## 2017-02-05 DIAGNOSIS — Z856 Personal history of leukemia: Secondary | ICD-10-CM | POA: Diagnosis not present

## 2017-02-05 DIAGNOSIS — D631 Anemia in chronic kidney disease: Secondary | ICD-10-CM | POA: Diagnosis not present

## 2017-02-05 DIAGNOSIS — R319 Hematuria, unspecified: Secondary | ICD-10-CM | POA: Diagnosis not present

## 2017-02-05 DIAGNOSIS — I129 Hypertensive chronic kidney disease with stage 1 through stage 4 chronic kidney disease, or unspecified chronic kidney disease: Secondary | ICD-10-CM | POA: Diagnosis not present

## 2017-02-05 DIAGNOSIS — N184 Chronic kidney disease, stage 4 (severe): Secondary | ICD-10-CM | POA: Diagnosis not present

## 2017-02-11 ENCOUNTER — Other Ambulatory Visit (HOSPITAL_COMMUNITY): Payer: Self-pay | Admitting: Nephrology

## 2017-02-11 DIAGNOSIS — N184 Chronic kidney disease, stage 4 (severe): Secondary | ICD-10-CM

## 2017-02-23 ENCOUNTER — Other Ambulatory Visit: Payer: Self-pay | Admitting: Radiology

## 2017-02-24 ENCOUNTER — Encounter (HOSPITAL_COMMUNITY): Payer: Self-pay

## 2017-02-24 ENCOUNTER — Ambulatory Visit (HOSPITAL_COMMUNITY)
Admission: RE | Admit: 2017-02-24 | Discharge: 2017-02-24 | Disposition: A | Discharge status: Home / Self Care | Payer: Medicare Other | Source: Ambulatory Visit | Attending: Nephrology | Admitting: Nephrology

## 2017-02-24 DIAGNOSIS — N184 Chronic kidney disease, stage 4 (severe): Secondary | ICD-10-CM | POA: Insufficient documentation

## 2017-02-24 DIAGNOSIS — N059 Unspecified nephritic syndrome with unspecified morphologic changes: Secondary | ICD-10-CM | POA: Diagnosis not present

## 2017-02-24 DIAGNOSIS — J8410 Pulmonary fibrosis, unspecified: Secondary | ICD-10-CM | POA: Insufficient documentation

## 2017-02-24 DIAGNOSIS — I701 Atherosclerosis of renal artery: Secondary | ICD-10-CM | POA: Diagnosis not present

## 2017-02-24 DIAGNOSIS — N261 Atrophy of kidney (terminal): Secondary | ICD-10-CM | POA: Diagnosis not present

## 2017-02-24 HISTORY — DX: Personal history of urinary calculi: Z87.442

## 2017-02-24 HISTORY — DX: Essential (primary) hypertension: I10

## 2017-02-24 HISTORY — DX: Chronic kidney disease, unspecified: N18.9

## 2017-02-24 LAB — CBC
HEMATOCRIT: 32.1 % — AB (ref 39.0–52.0)
HEMOGLOBIN: 10.9 g/dL — AB (ref 13.0–17.0)
MCH: 31.7 pg (ref 26.0–34.0)
MCHC: 34 g/dL (ref 30.0–36.0)
MCV: 93.3 fL (ref 78.0–100.0)
Platelets: 96 10*3/uL — ABNORMAL LOW (ref 150–400)
RBC: 3.44 MIL/uL — AB (ref 4.22–5.81)
RDW: 14.5 % (ref 11.5–15.5)
WBC: 22.9 10*3/uL — AB (ref 4.0–10.5)

## 2017-02-24 LAB — PROTIME-INR
INR: 1.05
Prothrombin Time: 13.7 seconds (ref 11.4–15.2)

## 2017-02-24 LAB — APTT: aPTT: 26 seconds (ref 24–36)

## 2017-02-24 MED ORDER — SODIUM CHLORIDE 0.9 % IV SOLN: INTRAVENOUS | Status: DC

## 2017-02-24 MED ORDER — MIDAZOLAM HCL 2 MG/2ML IJ SOLN
INTRAMUSCULAR | Status: AC
  Filled 2017-02-24: qty 2

## 2017-02-24 MED ORDER — SODIUM CHLORIDE 0.9 % IV SOLN
INTRAVENOUS | Status: AC | PRN
  Administered 2017-02-24: 10 mL/h via INTRAVENOUS

## 2017-02-24 MED ORDER — FENTANYL CITRATE (PF) 100 MCG/2ML IJ SOLN
INTRAMUSCULAR | Status: AC
  Filled 2017-02-24: qty 2

## 2017-02-24 MED ORDER — LABETALOL HCL 5 MG/ML IV SOLN
INTRAVENOUS | Status: AC
  Filled 2017-02-24: qty 4

## 2017-02-24 MED ORDER — HYDRALAZINE HCL 20 MG/ML IJ SOLN
INTRAMUSCULAR | Status: AC
  Filled 2017-02-24: qty 1

## 2017-02-24 MED ORDER — FENTANYL CITRATE (PF) 100 MCG/2ML IJ SOLN
INTRAMUSCULAR | Status: AC | PRN
  Administered 2017-02-24 (×2): 25 ug via INTRAVENOUS

## 2017-02-24 MED ORDER — MIDAZOLAM HCL 2 MG/2ML IJ SOLN
INTRAMUSCULAR | Status: AC | PRN
  Administered 2017-02-24: 0.5 mg via INTRAVENOUS
  Administered 2017-02-24: 1 mg via INTRAVENOUS
  Administered 2017-02-24: 0.5 mg via INTRAVENOUS

## 2017-02-24 MED ORDER — HYDRALAZINE HCL 20 MG/ML IJ SOLN
5.0000 mg | Freq: Once | INTRAMUSCULAR | Status: AC
  Administered 2017-02-24: 5 mg via INTRAVENOUS

## 2017-02-24 MED ORDER — LIDOCAINE HCL (PF) 1 % IJ SOLN
INTRAMUSCULAR | Status: AC
  Filled 2017-02-24: qty 30

## 2017-02-24 MED ORDER — HYDRALAZINE HCL 20 MG/ML IJ SOLN
5.0000 mg | Freq: Once | INTRAMUSCULAR | Status: AC
  Administered 2017-02-24: 10 mg via INTRAVENOUS

## 2017-02-24 NOTE — Procedures (Signed)
Interventional Radiology Procedure Note  Procedure: US guided renal biopsy  Complications: None  Estimated Blood Loss: < 10 mL  16 G core biopsy x 2 of left LP renal cortex.  Plan: 3 hour bedrest.  Eulas Post T. Kathlene Cote, M.D Pager:  [phone]

## 2017-02-24 NOTE — Discharge Instructions (Addendum)
Percutaneous Kidney Biopsy, Care After This sheet gives you information about how to care for yourself after your procedure. Your health care provider may also give you more specific instructions. If you have problems or questions, contact your health care provider. What can I expect after the procedure? After the procedure, it is common to have:  Pain or soreness near the area where the needle went through your skin (biopsy site).  Bright pink or cloudy urine for 24 hours after the procedure.  Follow these instructions at home: Activity  Return to your normal activities as told by your health care provider. Ask your health care provider what activities are safe for you.  Do not drive for 24 hours if you were given a medicine to help you relax (sedative).  Do not lift anything that is heavier than 10 lb (4.5 kg) until your health care provider tells you that it is safe.  Avoid activities that take a lot of effort (are strenuous) until your health care provider approves. Most people will have to wait 2 weeks before returning to activities such as exercise or sexual intercourse. General instructions  Take over-the-counter and prescription medicines only as told by your health care provider.  You may eat and drink after your procedure. Follow instructions from your health care provider about eating or drinking restrictions.  Check your biopsy site every day for signs of infection. Check for: ? More redness, swelling, or pain. ? More fluid or blood. ? Warmth. ? Pus or a bad smell.  Keep all follow-up visits as told by your health care provider. This is important. Contact a health care provider if:  You have more redness, swelling, or pain around your biopsy site.  You have more fluid or blood coming from your biopsy site.  Your biopsy site feels warm to the touch.  You have pus or a bad smell coming from your biopsy site.  You have blood in your urine more than 24 hours after  your procedure. Get help right away if:  You have dark red or brown urine.  You have a fever.  You are unable to urinate.  You feel burning when you urinate.  You feel faint.  You have severe pain in your abdomen or side. This information is not intended to replace advice given to you by your health care provider. Make sure you discuss any questions you have with your health care provider. Document Released: 04/13/2013 Document Revised: 05/23/2016 Document Reviewed: 05/23/2016 Elsevier Interactive Patient Education  2018 [place] rin - Cuidados posteriores (Percutaneous Kidney Biopsy, Care After) Siga estas instrucciones durante las prximas semanas. Estas indicaciones le proporcionan informacin general acerca de cmo deber cuidarse despus del procedimiento. El mdico tambin podr darle instrucciones ms especficas. El tratamiento se ha planificado de acuerdo a las prcticas mdicas actuales, pero a veces se producen problemas. Comunquese con el mdico si tiene algn problema o tiene dudas despus del procedimiento.  QU ESPERAR DESPUS DEL PROCEDIMIENTO   Podr notar algo de sangre en la orina durante las primeras 24 horas despus de la biopsia.  Podr sentir un poco de dolor en el sitio de la biopsia durante 1 - 2 semanas posteriores. INSTRUCCIONES PARA EL CUIDADO EN EL HOGAR  No levante nada que pese ms de 10 libras (4,5 kg) por 2 semanas.  No tome antiinflamatorios no esteroides (AINES) ni anticoagulantes durante una semana despus de la biopsia excepto que el mdico le indique que puede [place].  Slo tome medicamentos de venta libre o recetados para Glass blower/designer, Health and safety inspector o bajar la [place], segn las indicaciones de su mdico. [place] ATENCIN MDICA SI:  Lollie Marrow en la orina despus de 24 horas de realizada la biopsia.   Tiene fiebre.   No puede orinar.   Aumenta el dolor en el sitio de la biopsia.  SOLICITE ATENCIN  MDICA DE INMEDIATO SI: Se siente dbil o mareado.  Esta informacin no tiene Marine scientist el consejo del mdico. Asegrese de hacerle al mdico cualquier pregunta que tenga. Document Released: 04/13/2013 Elsevier Interactive Patient Education  2017 [place] consciente moderada en los adultos, cuidados posteriores (Moderate Conscious Sedation, Adult, Care After) Estas indicaciones le proporcionan informacin acerca de cmo deber cuidarse despus del procedimiento. El mdico tambin podr darle instrucciones ms especficas. El tratamiento ha sido planificado segn las prcticas mdicas actuales, pero en algunos casos pueden ocurrir problemas. Comunquese con el mdico si tiene algn problema o dudas despus del procedimiento. QU ESPERAR DESPUS DEL PROCEDIMIENTO Despus del procedimiento, es comn:  Sentirse somnoliento durante varias horas.  Sentirse torpe y [employer] de equilibrio durante varias horas.  Perder el sentido de la realidad durante varias horas.  Vomitar si come [employer]. INSTRUCCIONES PARA EL CUIDADO EN EL HOGAR Durante al menos 24horas despus del procedimiento:  No haga lo siguiente: ? Participar en actividades que impliquen posibles cadas o lesiones. ? Conducir vehculos. ? Operar maquinarias pesadas. ? Beber alcohol. ? Tomar somnferos o medicamentos que causen somnolencia. ? Firmar documentos legales ni tomar [employer]. ? Cuidar a nios por su cuenta.  Hacer reposo. Comida y bebida  Siga la dieta recomendada por el mdico.  Si vomita: ? Pruebe agua, jugo o sopa cuando usted pueda beber sin vomitar. ? Asegrese de no tener nuseas antes de ingerir alimentos slidos. Instrucciones generales  Permanezca con un adulto responsable hasta que est completamente despierto y consciente.  Tome los medicamentos de venta libre y los recetados solamente como se lo haya indicado el mdico.  Si fuma, no lo haga sin  supervisin.  Concurra a todas las visitas de control como se lo haya indicado el mdico. Esto es importante. SOLICITE ATENCIN MDICA SI:  Sigue teniendo nuseas o vomitando.  Tiene sensacin de desvanecimiento.  Le aparece una erupcin cutnea.  Tiene fiebre. SOLICITE ATENCIN MDICA DE INMEDIATO SI:  Tiene dificultad para respirar. Esta informacin no tiene Marine scientist el consejo del mdico. Asegrese de hacerle al mdico cualquier pregunta que tenga. Document Released: 08/16/2013 Document Revised: 09/01/2014 Document Reviewed: 12/01/2015 Elsevier Interactive Patient Education  [employer].

## 2017-02-24 NOTE — H&P (Signed)
Chief Complaint: Patient was seen in consultation today for renal disease  Referring Physician(s): Chrisman  Supervising Physician: Aletta Edouard  Patient Status: [facility] - Out-pt  History of Present Illness: Nathan Richards is a 71 y.o. male with past medical history of CLL, HTN, and kidney stones who presents with complaint of hematuria and proteinuria since 2015 and progressively worsening serum creatinine.  IR consulted for renal biopsy at the request of Dr. Hollie Salk.    Patient has been NPO.  He does not take blood thinners.  He has not been taking his blood pressure medication.   Past Medical History:  Diagnosis Date  . Chronic kidney disease   . History of kidney stones   . Hypertension   . Urolithiasis    cystoscopy in 2002    Past Surgical History:  Procedure Laterality Date  . CORONARY ANGIOPLASTY WITH STENT PLACEMENT  02/05/2004   drug eluting to LAD plus baloon 1st diagonal  . INGUINAL HERNIA REPAIR     bilateral  . KNEE ARTHROSCOPY     right, done in [place]  . UMBILICAL HERNIA REPAIR      Allergies: Ace inhibitors; Amlodipine besylate; Beta adrenergic blockers; and Hydrochlorothiazide  Medications: Prior to Admission medications   Medication Sig Start Date End Date Taking? Authorizing Provider  aspirin 81 MG tablet Take 1 tablet (81 mg total) by mouth daily. 01/27/17  Yes Jaynee Eagles, PA-C  losartan (COZAAR) 50 MG tablet Take 1 tablet (50 mg total) by mouth daily. 01/27/17  Yes Jaynee Eagles, PA-C  metoprolol tartrate (LOPRESSOR) 50 MG tablet Take 1 tablet (50 mg total) by mouth 2 (two) times daily. 01/27/17  Yes Jaynee Eagles, PA-C     Family History  Problem Relation Age of Onset  . Diabetes Mother   . Coronary artery disease Mother   . Coronary artery disease Father        died  . Diabetes Father   . Diabetes Brother   . Diabetes Brother        died    Social History   Social History  . Marital status: Married    Spouse name: N/A  . Number  of children: N/A  . Years of education: N/A   Occupational History  .  Unemployed   Social History Main Topics  . Smoking status: Former Smoker    Years: 31.00    Types: Cigarettes    Quit date: 12/22/1997  . Smokeless tobacco: Never Used  . Alcohol use No  . Drug use: No     Comment: remote use of MJ and cocaine  . Sexual activity: No   Other Topics Concern  . None   Social History Narrative   Born [place], lived in [place]. For years before moving to [place]   Married, currently going through a divorce. Wife is in [place] alone    He has a son living here from a prior relationship   He and his current wife have a daughter and son together   Another son was killed at age 19   2 grand daughters in [place], [place], Ramiro Pangilinan lives locally   Disabled by psychiatric disease.    [employer] member    Review of Systems  Constitutional: Negative for fatigue and fever.  Respiratory: Negative for cough and shortness of breath.   Cardiovascular: Negative for chest pain.  Psychiatric/Behavioral: Negative for behavioral problems and confusion.    Vital Signs: BP Marland Kitchen)  191/103   Pulse 67   Temp 97.8 F (36.6 C) (Oral)   Resp 16   Ht $R'5\' 2"'nP$  (1.575 m)   Wt 120 lb (54.4 kg)   SpO2 100%   BMI 21.95 kg/m   Physical Exam  Constitutional: He is oriented to person, place, and time. He appears well-developed.  Cardiovascular: Normal rate, regular rhythm and normal heart sounds.   Pulmonary/Chest: Effort normal and breath sounds normal. No respiratory distress.  Neurological: He is alert and oriented to person, place, and time.  Psychiatric: He has a normal mood and affect. His behavior is normal. Judgment and thought content normal.  Nursing note and vitals reviewed.   Mallampati Score:  MD Evaluation Airway: WNL Heart: WNL Abdomen: WNL Chest/ Lungs: WNL ASA  Classification: 3 Mallampati/Airway Score: Two  Imaging: No results  found.  Labs:  CBC:  Recent Labs  01/27/17 1054 02/24/17 0615  WBC 23.5* 22.9*  HGB 10.8* 10.9*  HCT 32.1* 32.1*  PLT 113* 96*    COAGS:  Recent Labs  02/24/17 0615  INR 1.05  APTT 26    BMP:  Recent Labs  01/27/17 1054  NA 140  K 4.0  CL 105  CO2 24  GLUCOSE 160*  BUN 35*  CALCIUM 9.5  CREATININE 3.12*  GFRNONAA 19*  GFRAA 22*    LIVER FUNCTION TESTS:  Recent Labs  01/27/17 1054  BILITOT 0.3  AST 21  ALT 9  ALKPHOS 101  PROT 6.2  ALBUMIN 3.4*    TUMOR MARKERS: No results for input(s): AFPTM, CEA, CA199, CHROMGRNA in the last 8760 hours.  Assessment and Plan: Patient with history of CLL, HTN, and progressive renal disease presents for random kidney biopsy at the request of Dr. Hollie Salk.  Patient presents today in his usual state of health. He has been NPO and does not take blood thinners.  His BP is elevated today. Will give IV hydralazine and monitor effects on BP.  If able to reduce BP to more appropriate range, will proceed with procedure.  Patient aware of the status of his BP and it's implications on his procedure today.  Risks and Benefits discussed with the patient including, but not limited to bleeding, infection, damage to adjacent structures or low yield requiring additional tests. All of the patient's questions were answered, patient is agreeable to proceed. Consent signed and in chart.  Thank you for this interesting consult.  I greatly enjoyed meeting Nathan Richards and look forward to participating in their care.  A copy of this report was sent to the requesting provider on this date.  Electronically Signed: Docia Barrier, PA 02/24/2017, 7:54 AM   I spent a total of  30 Minutes   in face to face in clinical consultation, greater than 50% of which was counseling/coordinating care for renal disease

## 2017-03-03 ENCOUNTER — Encounter (HOSPITAL_COMMUNITY): Payer: Self-pay

## 2017-03-10 ENCOUNTER — Encounter (HOSPITAL_COMMUNITY): Payer: Self-pay

## 2017-03-17 ENCOUNTER — Ambulatory Visit: Payer: Medicaid Other | Admitting: Cardiovascular Disease

## 2017-03-17 ENCOUNTER — Encounter: Payer: Self-pay | Admitting: *Deleted

## 2017-03-25 ENCOUNTER — Other Ambulatory Visit (HOSPITAL_BASED_OUTPATIENT_CLINIC_OR_DEPARTMENT_OTHER): Payer: Medicare Other

## 2017-03-25 ENCOUNTER — Other Ambulatory Visit (HOSPITAL_COMMUNITY)
Admission: RE | Admit: 2017-03-25 | Discharge: 2017-03-25 | Disposition: A | Discharge status: Home / Self Care | Payer: Medicare Other | Source: Ambulatory Visit | Attending: Hematology & Oncology | Admitting: Hematology & Oncology

## 2017-03-25 ENCOUNTER — Ambulatory Visit (HOSPITAL_BASED_OUTPATIENT_CLINIC_OR_DEPARTMENT_OTHER): Payer: Medicare Other | Admitting: Hematology & Oncology

## 2017-03-25 ENCOUNTER — Ambulatory Visit: Payer: Medicare Other

## 2017-03-25 VITALS — BP 207/95 | HR 62 | Temp 98.2°F | Resp 18 | Ht 62.0 in | Wt 125.0 lb

## 2017-03-25 DIAGNOSIS — N08 Glomerular disorders in diseases classified elsewhere: Secondary | ICD-10-CM

## 2017-03-25 DIAGNOSIS — C9110 Chronic lymphocytic leukemia of B-cell type not having achieved remission: Secondary | ICD-10-CM

## 2017-03-25 DIAGNOSIS — C9190 Lymphoid leukemia, unspecified not having achieved remission: Secondary | ICD-10-CM | POA: Insufficient documentation

## 2017-03-25 DIAGNOSIS — I7090 Unspecified atherosclerosis: Secondary | ICD-10-CM

## 2017-03-25 LAB — CMP (CANCER CENTER ONLY)
ALK PHOS: 111 U/L — AB (ref 26–84)
ALT: 9 U/L — AB (ref 10–47)
AST: 16 U/L (ref 11–38)
Albumin: 3 g/dL — ABNORMAL LOW (ref 3.3–5.5)
BUN: 33 mg/dL — AB (ref 7–22)
CALCIUM: 9.4 mg/dL (ref 8.0–10.3)
CO2: 26 mEq/L (ref 18–33)
Chloride: 105 mEq/L (ref 98–108)
Creat: 3.4 mg/dl (ref 0.6–1.2)
Glucose, Bld: 129 mg/dL — ABNORMAL HIGH (ref 73–118)
POTASSIUM: 3.6 meq/L (ref 3.3–4.7)
Sodium: 138 mEq/L (ref 128–145)
TOTAL PROTEIN: 6.5 g/dL (ref 6.4–8.1)
Total Bilirubin: 0.5 mg/dl (ref 0.20–1.60)

## 2017-03-25 LAB — CBC WITH DIFFERENTIAL (CANCER CENTER ONLY)
BASO#: 0 10*3/uL (ref 0.0–0.2)
BASO%: 0.1 % (ref 0.0–2.0)
EOS ABS: 1.1 10*3/uL — AB (ref 0.0–0.5)
EOS%: 4.8 % (ref 0.0–7.0)
HEMATOCRIT: 31.2 % — AB (ref 38.7–49.9)
HGB: 10.7 g/dL — ABNORMAL LOW (ref 13.0–17.1)
LYMPH#: 20.3 10*3/uL — AB (ref 0.9–3.3)
LYMPH%: 87.6 % — AB (ref 14.0–48.0)
MCH: 32.4 pg (ref 28.0–33.4)
MCHC: 34.3 g/dL (ref 32.0–35.9)
MCV: 95 fL (ref 82–98)
MONO#: 0.1 10*3/uL (ref 0.1–0.9)
MONO%: 0.6 % (ref 0.0–13.0)
NEUT#: 1.6 10*3/uL (ref 1.5–6.5)
NEUT%: 6.9 % — AB (ref 40.0–80.0)
Platelets: 90 10*3/uL — ABNORMAL LOW (ref 145–400)
RBC: 3.3 10*6/uL — ABNORMAL LOW (ref 4.20–5.70)
RDW: 14 % (ref 11.1–15.7)
WBC: 23.2 10*3/uL — ABNORMAL HIGH (ref 4.0–10.0)

## 2017-03-25 LAB — CHCC SATELLITE - SMEAR

## 2017-03-25 LAB — TECHNOLOGIST REVIEW CHCC SATELLITE

## 2017-03-25 NOTE — Progress Notes (Signed)
Referral MD  Reason for Referral: Leukocytosis with lambda light chain deposition in kidneys; renal failure   Chief Complaint  Patient presents with  . New Patient (Initial Visit)  : I have leukemia.  HPI: [name] is a very nice 71 year old Hispanic male. He is from [place]. He comes in with an interpreter. The interpreter was incredibly helpful.  Mr. Foisy is followed by Dr. Mitchel Honour. He apparently has a diagnosis of CLL.  He had flow cytometry done on his peripheral blood back in 2009. This did show a monoclonal population of B cells that were consistent with CLL.. From the records that I have, back in 2014 his white cell count was 29,000. He never has been anemic. He is not had thrombocytopenia.  In June of this year, his white cell count was 23.5. His hemoglobin was 10.8 and platelet count 113,000. A month ago, his white cell count was 23,000. Hemoglobin 10.9 and platelet count 96,000.  The real problem has been development of renal insufficiency. His creatinine in June of this year was 3.1. His BUN was 35.  He was sent to nephrology. They went ahead and had a kidney biopsy done. This was done on 02/24/2017. 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 This was sent to [facility]. They confirmed the diagnosis. 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 Immunofixation was done on the serum. He has both an IgG kappa IgG lambda spike. Both Kappa and Lambda light chains were slightly elevated.  Based on this, he was  referred to the [facility] for an evaluation.  He has lost about 30 pounds. He has not noted any palpable lymph glands.  Last CT scan that he had done was in December. CT of the abdomen and pelvis were negative for any lymphadenopathy. There is no splenomegaly.  He has had no rashes. He's had no leg swelling. He's had no obvious change in  bowel or bladder habits. He is still making urine.  Overall, his performance status is ECOG 1.    Past Medical History:  Diagnosis Date  . Chronic kidney disease   . History of kidney stones   . Hypertension   . Urolithiasis    cystoscopy in 2002  :  Past Surgical History:  Procedure Laterality Date  . CORONARY ANGIOPLASTY WITH STENT PLACEMENT  02/05/2004   drug eluting to LAD plus baloon 1st diagonal  . INGUINAL HERNIA REPAIR     bilateral  . KNEE ARTHROSCOPY     right, done in [place]  . UMBILICAL HERNIA REPAIR    :  No current outpatient prescriptions on file.:  :  Allergies  Allergen Reactions  . Ace Inhibitors     REACTION: cough  . Amlodipine Besylate     REACTION: Impotence  . Beta Adrenergic Blockers     REACTION: Impotence  . Hydrochlorothiazide Other (See Comments)    May have contributed to his hypercalcemia  :  Family History  Problem Relation Age of Onset  . Diabetes Mother   . Coronary artery disease Mother   . Coronary artery disease Father        died  . Diabetes Father   . Diabetes Brother   . Diabetes Brother        died  :  Social History   Social History  . Marital status: Married    Spouse name: N/A  .  Number of children: N/A  . Years of education: N/A   Occupational History  .  Unemployed   Social History Main Topics  . Smoking status: Former Smoker    Years: 31.00    Types: Cigarettes    Quit date: 12/22/1997  . Smokeless tobacco: Never Used  . Alcohol use No  . Drug use: No     Comment: remote use of MJ and cocaine  . Sexual activity: No   Other Topics Concern  . Not on file   Social History Narrative   Born [place], lived in [place]. For years before moving to [place]   Married, currently going through a divorce. Wife is in [place] alone    He has a son living here from a prior relationship   He and his current wife have a daughter and son together   Another son was killed at age 71   2 grand daughters in  [place], [place], Treyson Axel lives locally   Disabled by psychiatric disease.    [employer] member  :  Pertinent items are noted in HPI.  Exam:Somewhat small Hispanic male. He is in no obvious distress. Vital signs show a temperature of 98.2. Pulse is 62. Blood pressure 207/95. Weight is 125 pounds. Head negative exam shows no ocular or oral lesions. There are no palpable cervical or supraclavicular lymph nodes. Lungs are clear bilaterally. Cardiac exam regular rate and rhythm with no murmurs, rubs or bruits. Abdomen is soft. Has good bowel sounds. There is no fluid wave. There is no palpable liver or spleen tip. Back exam shows no tenderness over the spine, ribs or hips. Extremities shows no clubbing, cyanosis or edema. Skin exam shows no rashes, ecchymoses or petechia. Axillary exam shows no bilateral axillary adenopathy. Neurological exam shows no focal neurological deficits.    Recent Labs  03/25/17 0951  WBC 23.2*  HGB 10.7*  HCT 31.2*  PLT 90*    Recent Labs  03/25/17 0951  NA 138  K 3.6  CL 105  CO2 26  GLUCOSE 129*  BUN 33*  CREATININE 3.4*  CALCIUM 9.4    Blood smear review:  Normochromic and normocytic population of red blood cells. There is no nucleated red blood cells. I see no teardrop cells. He has no target cells. I see no sickle cells. I see no rouleau formation. White cells. Increased in number. These are mostly mature lymphocytes. He has a few smudge cells. I do not see any plasmacytoid cells. Platelets are slightly decreased in number. Platelets are well granulated.  Pathology: None     Assessment and Plan:  [name] is a 71 year old Hispanic male. It looks like he has CLL. He might be one of these rare patients with CLL who actually has light chain deposition in his kidneys. We have a another patient with a similar situation. He has done quite well.  We did do a bone marrow biopsy in the office today. Through the interpreter, we got  informed consent. We did the procedure with the patient on his right side. The left posterior iliac crest was prepped and draped in sterile fashion. We used 1% lidocaine under the skin into the periosteum. A scalpel was used to make an incision into the skin. Despite several attempts, I could not aspirate any liquid. I did obtain a very good biopsy core.  He tolerated the procedure well. We cleaned and dressed the procedure site sterilely. His interpreter helped  [place] through the entire procedure with the time the patient will was going on.  Clearly, we will have to initiate therapy. Once I confirmed that this is CLL, we will start him on treatment. We probably can use Rituxan with bendamustine. I then this would be a reasonable approach.  As far as cytogenetics, I probably will have to get this off the peripheral blood.  The interpreter, again, was incredibly helpful with the relaying of information to Mr. Enriques.  We will have to see what the bone marrow shows. I will then have to speak to his nephrologist.  We spent about 90 minutes with Mr. Diop. He is very nice. It was nice talking to him about [place].

## 2017-03-26 ENCOUNTER — Encounter: Payer: Self-pay | Admitting: Hematology & Oncology

## 2017-03-26 LAB — IGG, IGA, IGM
IgA, Qn, Serum: 206 mg/dL (ref 61–437)
IgG, Qn, Serum: 1175 mg/dL (ref 700–1600)
IgM, Qn, Serum: 103 mg/dL (ref 20–172)

## 2017-03-26 LAB — KAPPA/LAMBDA LIGHT CHAINS
IG KAPPA FREE LIGHT CHAIN: 75.2 mg/L — AB (ref 3.3–19.4)
IG LAMBDA FREE LIGHT CHAIN: 103.7 mg/L — AB (ref 5.7–26.3)
Kappa/Lambda FluidC Ratio: 0.73 (ref 0.26–1.65)

## 2017-03-27 ENCOUNTER — Other Ambulatory Visit (HOSPITAL_COMMUNITY)
Admission: RE | Admit: 2017-03-27 | Discharge: 2017-03-27 | Disposition: A | Discharge status: Home / Self Care | Payer: Medicare Other | Source: Ambulatory Visit | Attending: Hematology & Oncology | Admitting: Hematology & Oncology

## 2017-03-27 ENCOUNTER — Other Ambulatory Visit: Payer: Self-pay | Admitting: Family

## 2017-03-27 ENCOUNTER — Ambulatory Visit (HOSPITAL_BASED_OUTPATIENT_CLINIC_OR_DEPARTMENT_OTHER): Payer: Medicare Other

## 2017-03-27 DIAGNOSIS — C9190 Lymphoid leukemia, unspecified not having achieved remission: Secondary | ICD-10-CM | POA: Diagnosis present

## 2017-03-27 DIAGNOSIS — C9110 Chronic lymphocytic leukemia of B-cell type not having achieved remission: Secondary | ICD-10-CM

## 2017-03-27 LAB — PROTEIN ELECTROPHORESIS, SERUM, WITH REFLEX
A/G RATIO SPE: 1.1 (ref 0.7–1.7)
ALBUMIN: 3.2 g/dL (ref 2.9–4.4)
ALPHA 1: 0.2 g/dL (ref 0.0–0.4)
Alpha 2: 0.7 g/dL (ref 0.4–1.0)
Beta: 0.7 g/dL (ref 0.7–1.3)
GAMMA GLOBULIN: 1.2 g/dL (ref 0.4–1.8)
Globulin, Total: 2.9 g/dL (ref 2.2–3.9)
Interpretation(See Below): 0
M-Spike, %: 0.5 g/dL — ABNORMAL HIGH
TOTAL PROTEIN: 6.1 g/dL (ref 6.0–8.5)

## 2017-03-27 LAB — CBC WITH DIFFERENTIAL (CANCER CENTER ONLY)
BASO#: 0 10*3/uL (ref 0.0–0.2)
BASO%: 0.1 % (ref 0.0–2.0)
EOS ABS: 1.3 10*3/uL — AB (ref 0.0–0.5)
EOS%: 4.4 % (ref 0.0–7.0)
HEMATOCRIT: 32.2 % — AB (ref 38.7–49.9)
HGB: 11.1 g/dL — ABNORMAL LOW (ref 13.0–17.1)
LYMPH#: 26.8 10*3/uL — AB (ref 0.9–3.3)
LYMPH%: 88.8 % — AB (ref 14.0–48.0)
MCH: 32.2 pg (ref 28.0–33.4)
MCHC: 34.5 g/dL (ref 32.0–35.9)
MCV: 93 fL (ref 82–98)
MONO#: 0.2 10*3/uL (ref 0.1–0.9)
MONO%: 0.8 % (ref 0.0–13.0)
NEUT#: 1.8 10*3/uL (ref 1.5–6.5)
NEUT%: 5.9 % — AB (ref 40.0–80.0)
PLATELETS: 95 10*3/uL — AB (ref 145–400)
RBC: 3.45 10*6/uL — ABNORMAL LOW (ref 4.20–5.70)
RDW: 13.8 % (ref 11.1–15.7)
WBC: 30.2 10*3/uL — ABNORMAL HIGH (ref 4.0–10.0)

## 2017-03-27 LAB — UIFE/LIGHT CHAINS/TP QN, 24-HR UR
FR KAPPA LT CH,24HR: 236 mg/(24.h)
FR LAMBDA LT CH,24HR: 48 mg/(24.h)
FREE KAPPA LT CHAINS, UR: 205 mg/L — AB (ref 1.35–24.19)
FREE LAMBDA LT CHAINS, UR: 41.4 mg/L — AB (ref 0.24–6.66)
KAPPA/LAMBDA RATIO, U: 4.95 (ref 2.04–10.37)
PROTEIN,TOTAL,URINE: 277.7 mg/dL

## 2017-04-02 ENCOUNTER — Ambulatory Visit (HOSPITAL_BASED_OUTPATIENT_CLINIC_OR_DEPARTMENT_OTHER): Payer: Medicare Other | Admitting: Hematology & Oncology

## 2017-04-02 ENCOUNTER — Other Ambulatory Visit: Payer: Self-pay | Admitting: *Deleted

## 2017-04-02 ENCOUNTER — Other Ambulatory Visit (HOSPITAL_BASED_OUTPATIENT_CLINIC_OR_DEPARTMENT_OTHER): Payer: Medicare Other

## 2017-04-02 VITALS — BP 205/84 | HR 72 | Temp 98.2°F | Resp 16 | Wt 124.0 lb

## 2017-04-02 DIAGNOSIS — C9110 Chronic lymphocytic leukemia of B-cell type not having achieved remission: Secondary | ICD-10-CM | POA: Diagnosis not present

## 2017-04-02 DIAGNOSIS — N289 Disorder of kidney and ureter, unspecified: Secondary | ICD-10-CM | POA: Diagnosis not present

## 2017-04-02 DIAGNOSIS — I10 Essential (primary) hypertension: Secondary | ICD-10-CM | POA: Diagnosis not present

## 2017-04-02 LAB — CBC WITH DIFFERENTIAL (CANCER CENTER ONLY)
BASO#: 0 10*3/uL (ref 0.0–0.2)
BASO%: 0.1 % (ref 0.0–2.0)
EOS ABS: 1.3 10*3/uL — AB (ref 0.0–0.5)
EOS%: 4.8 % (ref 0.0–7.0)
HEMATOCRIT: 31.1 % — AB (ref 38.7–49.9)
HEMOGLOBIN: 10.6 g/dL — AB (ref 13.0–17.1)
LYMPH#: 23.8 10*3/uL — AB (ref 0.9–3.3)
LYMPH%: 87.4 % — ABNORMAL HIGH (ref 14.0–48.0)
MCH: 32.1 pg (ref 28.0–33.4)
MCHC: 34.1 g/dL (ref 32.0–35.9)
MCV: 94 fL (ref 82–98)
MONO#: 0.2 10*3/uL (ref 0.1–0.9)
MONO%: 0.9 % (ref 0.0–13.0)
NEUT%: 6.8 % — ABNORMAL LOW (ref 40.0–80.0)
NEUTROS ABS: 1.8 10*3/uL (ref 1.5–6.5)
Platelets: 93 10*3/uL — ABNORMAL LOW (ref 145–400)
RBC: 3.3 10*6/uL — ABNORMAL LOW (ref 4.20–5.70)
RDW: 13.9 % (ref 11.1–15.7)
WBC: 27.2 10*3/uL — ABNORMAL HIGH (ref 4.0–10.0)

## 2017-04-02 LAB — CMP (CANCER CENTER ONLY)
ALBUMIN: 2.9 g/dL — AB (ref 3.3–5.5)
ALT(SGPT): 10 U/L (ref 10–47)
AST: 20 U/L (ref 11–38)
Alkaline Phosphatase: 105 U/L — ABNORMAL HIGH (ref 26–84)
BILIRUBIN TOTAL: 0.5 mg/dL (ref 0.20–1.60)
BUN, Bld: 51 mg/dL — ABNORMAL HIGH (ref 7–22)
CALCIUM: 9.3 mg/dL (ref 8.0–10.3)
CO2: 27 meq/L (ref 18–33)
Chloride: 111 mEq/L — ABNORMAL HIGH (ref 98–108)
Creat: 3.4 mg/dl (ref 0.6–1.2)
Glucose, Bld: 129 mg/dL — ABNORMAL HIGH (ref 73–118)
Potassium: 3.8 mEq/L (ref 3.3–4.7)
Sodium: 139 mEq/L (ref 128–145)
Total Protein: 6.6 g/dL (ref 6.4–8.1)

## 2017-04-02 LAB — TECHNOLOGIST REVIEW CHCC SATELLITE

## 2017-04-02 NOTE — Progress Notes (Signed)
Hematology and Oncology Follow Up Visit  Nathan Richards [id number] 29-Sep-1945 71 y.o. 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 We last saw him a week or so ago. We saw him, we exited a bone marrow test the same day. The bone marrow report ([id number]) showed chronic lymphocytic leukemia. Flow cytometry confirmed this.  He must have light chain production from these leukemia cells. He has had a kidney biopsy in the past area and the kidney biopsy showed that he had light chain deposition in his kidney tubules.  He does have renal insufficiency. Today, his BUN was 51 and creatinine 3.4.  He comes in with his interpreter. She did a fantastic job.  He seems be doing okay. He has not had any pain. He's had no bleeding. He is going to the bathroom okay. He has no rashes. He's had no leg swelling.  He does have high blood pressure issues. I told him that he really needs to get his blood pressure under good control. This certainly might be part of why he has the renal insufficiency.  I talked to him at length about treatment. He clearly needs to be treated. However, he wants to think about this.  I probably would treat him with Rituxan/Cytoxan/vincristine. I cannot use bendamustine because of his poor renal function.  He needs to have CT scans done. I cannot find anything on his physical exam with suspected lymphadenopathy.  Overall, I was able performance status is ECOG 1.  Medications:  Current Outpatient Prescriptions:  .  metoprolol tartrate (LOPRESSOR) 50 MG tablet, Take 50 mg by mouth daily. Patient is non compliant., Disp: , Rfl: 3  Allergies:  Allergies  Allergen Reactions  . Ace Inhibitors     REACTION: cough  . Amlodipine Besylate     REACTION: Impotence  . Beta Adrenergic Blockers     REACTION: Impotence  .  Hydrochlorothiazide Other (See Comments)    May have contributed to his hypercalcemia    Past Medical History, Surgical history, Social history, and Family History were reviewed and updated.  Review of Systems:  As above  Physical Exam:  weight is 124 lb (56.2 kg). His oral temperature is 98.2 F (36.8 C). His blood pressure is 205/84 (abnormal) and his pulse is 72. His respiration is 16 and oxygen saturation is 100%.   Wt Readings from Last 3 Encounters:  04/02/17 124 lb (56.2 kg)  03/25/17 125 lb (56.7 kg)  02/24/17 120 lb (54.4 kg)     Head and neck exam shows no ocular or oral lesions. There are no palpable cervical or supraclavicular lymph nodes. Lungs are clear bilaterally. Cardiac exam regular rate and rhythm with no murmurs, rubs or bruits. Abdomen is soft. Has good bowel sounds. There is no fluid wave. There is no palpable liver or spleen tip. Back exam shows no tenderness over the spine, ribs or hips. Extremities shows no clubbing, cyanosis or edema. Skin exam shows no rashes, ecchymoses or petechia. Axillary exam shows no bilateral axillary adenopathy. Neurological exam shows no focal neurological deficits.  Lab Results  Component Value Date   WBC 27.2 (H) 04/02/2017   HGB 10.6 (L) 04/02/2017   HCT 31.1 (L) 04/02/2017   MCV 94 04/02/2017   PLT 93 (L) 04/02/2017     Chemistry  Component Value Date/Time   NA 139 04/02/2017 1058   K 3.8 04/02/2017 1058   CL 111 (H) 04/02/2017 1058   CO2 27 04/02/2017 1058   BUN 51 (H) 04/02/2017 1058   CREATININE 3.4 (HH) 04/02/2017 1058      Component Value Date/Time   CALCIUM 9.3 04/02/2017 1058   ALKPHOS 105 (H) 04/02/2017 1058   AST 20 04/02/2017 1058   ALT 10 04/02/2017 1058   BILITOT 0.50 04/02/2017 1058         Impression and Plan: Nathan Richards is a 71 year old Hispanic male. He has renal insufficiency. This is secondary to his CLL.  Of note, I have checked his light chain. His lambda light chain was 10.4 mg/dL.  He does have an IgG spike of 1175 mg/dL. He does have an M spike of 0.5 g/dL.  I told him that if he does not get treatment, that his kidneys may fail and he will need dialysis. He understands this.  Again his blood pressure is quite high. He definitely needs to have this addressed. Hopefully he will see his family doctor or nephrologist for this.  We will get his CT scans done in a couple weeks. I will then plan to see him back a couple weeks afterwards and hopefully we will be able to finalize a plan for therapy.  I spent about 40 minutes with Nathan Richards today. His interpreter did a fantastic job. Volanda Napoleon, MD 8/9/20185:33 PM

## 2017-04-09 ENCOUNTER — Ambulatory Visit (HOSPITAL_COMMUNITY): Payer: Medicare Other

## 2017-04-13 ENCOUNTER — Ambulatory Visit: Payer: Medicare Other | Admitting: Physician Assistant

## 2017-05-08 ENCOUNTER — Ambulatory Visit: Payer: Medicare Other | Admitting: Hematology & Oncology

## 2017-05-08 ENCOUNTER — Other Ambulatory Visit: Payer: Medicare Other

## 2017-06-19 ENCOUNTER — Emergency Department (HOSPITAL_COMMUNITY)
Admission: EM | Admit: 2017-06-19 | Discharge: 2017-06-20 | Disposition: A | Discharge status: Home / Self Care | Payer: Medicare Other | Attending: Emergency Medicine | Admitting: Emergency Medicine

## 2017-06-19 ENCOUNTER — Encounter (HOSPITAL_COMMUNITY): Payer: Self-pay | Admitting: Emergency Medicine

## 2017-06-19 DIAGNOSIS — M545 Low back pain, unspecified: Secondary | ICD-10-CM

## 2017-06-19 DIAGNOSIS — Z955 Presence of coronary angioplasty implant and graft: Secondary | ICD-10-CM | POA: Diagnosis not present

## 2017-06-19 DIAGNOSIS — R262 Difficulty in walking, not elsewhere classified: Secondary | ICD-10-CM | POA: Diagnosis not present

## 2017-06-19 DIAGNOSIS — Z87891 Personal history of nicotine dependence: Secondary | ICD-10-CM | POA: Insufficient documentation

## 2017-06-19 DIAGNOSIS — I129 Hypertensive chronic kidney disease with stage 1 through stage 4 chronic kidney disease, or unspecified chronic kidney disease: Secondary | ICD-10-CM | POA: Diagnosis not present

## 2017-06-19 DIAGNOSIS — N183 Chronic kidney disease, stage 3 (moderate): Secondary | ICD-10-CM | POA: Insufficient documentation

## 2017-06-19 DIAGNOSIS — M549 Dorsalgia, unspecified: Secondary | ICD-10-CM

## 2017-06-19 DIAGNOSIS — C9110 Chronic lymphocytic leukemia of B-cell type not having achieved remission: Secondary | ICD-10-CM | POA: Diagnosis not present

## 2017-06-19 MED ORDER — ONDANSETRON HCL 4 MG/2ML IJ SOLN
4.0000 mg | Freq: Once | INTRAMUSCULAR | Status: AC
  Administered 2017-06-20: 4 mg via INTRAVENOUS
  Filled 2017-06-19: qty 2

## 2017-06-19 MED ORDER — MORPHINE SULFATE (PF) 4 MG/ML IV SOLN
4.0000 mg | Freq: Once | INTRAVENOUS | Status: AC
  Administered 2017-06-20: 4 mg via INTRAVENOUS
  Filled 2017-06-19: qty 1

## 2017-06-19 MED ORDER — OXYCODONE-ACETAMINOPHEN 5-325 MG PO TABS
ORAL_TABLET | ORAL | Status: AC
  Filled 2017-06-19: qty 1

## 2017-06-19 MED ORDER — SODIUM CHLORIDE 0.9 % IV BOLUS (SEPSIS)
1000.0000 mL | Freq: Once | INTRAVENOUS | Status: AC
  Administered 2017-06-19: 1000 mL via INTRAVENOUS

## 2017-06-19 MED ORDER — OXYCODONE-ACETAMINOPHEN 5-325 MG PO TABS
1.0000 | ORAL_TABLET | ORAL | Status: DC | PRN
  Administered 2017-06-19: 1 via ORAL

## 2017-06-19 NOTE — ED Triage Notes (Signed)
Pt presents from home with [facility] for severe lower back pain where patient was non-ambulatory on scene; pt denies any known injury just states that it began on Tuesday in the cold while out in the field; pt reporting weakness all over; also states no medication  2 months

## 2017-06-20 ENCOUNTER — Emergency Department (HOSPITAL_COMMUNITY): Payer: Medicare Other

## 2017-06-20 DIAGNOSIS — M545 Low back pain: Secondary | ICD-10-CM | POA: Diagnosis not present

## 2017-06-20 LAB — COMPREHENSIVE METABOLIC PANEL
ALK PHOS: 103 U/L (ref 38–126)
ALT: 7 U/L — ABNORMAL LOW (ref 17–63)
ANION GAP: 8 (ref 5–15)
AST: 15 U/L (ref 15–41)
Albumin: 3 g/dL — ABNORMAL LOW (ref 3.5–5.0)
BILIRUBIN TOTAL: 0.6 mg/dL (ref 0.3–1.2)
BUN: 48 mg/dL — ABNORMAL HIGH (ref 6–20)
CALCIUM: 8.6 mg/dL — AB (ref 8.9–10.3)
CO2: 21 mmol/L — ABNORMAL LOW (ref 22–32)
CREATININE: 3.74 mg/dL — AB (ref 0.61–1.24)
Chloride: 107 mmol/L (ref 101–111)
GFR calc non Af Amer: 15 mL/min — ABNORMAL LOW (ref >60)
GFR, EST AFRICAN AMERICAN: 18 mL/min — AB (ref >60)
Glucose, Bld: 112 mg/dL — ABNORMAL HIGH (ref 65–99)
Potassium: 3.6 mmol/L (ref 3.5–5.1)
Sodium: 136 mmol/L (ref 135–145)
TOTAL PROTEIN: 6.1 g/dL — AB (ref 6.5–8.1)

## 2017-06-20 LAB — URINALYSIS, ROUTINE W REFLEX MICROSCOPIC
Bilirubin Urine: NEGATIVE
GLUCOSE, UA: 50 mg/dL — AB
Ketones, ur: NEGATIVE mg/dL
Leukocytes, UA: NEGATIVE
NITRITE: NEGATIVE
PH: 5 (ref 5.0–8.0)
Specific Gravity, Urine: 1.014 (ref 1.005–1.030)
Squamous Epithelial / LPF: NONE SEEN

## 2017-06-20 LAB — CBC WITH DIFFERENTIAL/PLATELET
BASOS PCT: 0 %
Basophils Absolute: 0 10*3/uL (ref 0.0–0.1)
EOS PCT: 4 %
Eosinophils Absolute: 0.9 10*3/uL — ABNORMAL HIGH (ref 0.0–0.7)
HEMATOCRIT: 22.4 % — AB (ref 39.0–52.0)
HEMOGLOBIN: 7.7 g/dL — AB (ref 13.0–17.0)
LYMPHS ABS: 19.2 10*3/uL — AB (ref 0.7–4.0)
Lymphocytes Relative: 88 %
MCH: 31.6 pg (ref 26.0–34.0)
MCHC: 34.4 g/dL (ref 30.0–36.0)
MCV: 91.8 fL (ref 78.0–100.0)
MONO ABS: 0 10*3/uL — AB (ref 0.1–1.0)
MONOS PCT: 0 %
NEUTROS PCT: 8 %
Neutro Abs: 1.7 10*3/uL (ref 1.7–7.7)
Platelets: 127 10*3/uL — ABNORMAL LOW (ref 150–400)
RBC: 2.44 MIL/uL — AB (ref 4.22–5.81)
RDW: 14.6 % (ref 11.5–15.5)
WBC: 21.8 10*3/uL — AB (ref 4.0–10.5)

## 2017-06-20 LAB — LIPASE, BLOOD: Lipase: 38 U/L (ref 11–51)

## 2017-06-20 MED ORDER — MORPHINE SULFATE 15 MG PO TABS: 7.5000 mg | ORAL_TABLET | ORAL | 0 refills | Status: DC | PRN

## 2017-06-20 NOTE — ED Provider Notes (Signed)
[facility] EMERGENCY DEPARTMENT Provider Note   CSN: [id number] Arrival date & time: 06/19/17  2224     History   Chief Complaint Chief Complaint  Patient presents with  . Back Pain  . Hypertension    HPI Nathan Richards is a 71 y.o. male.  71 yo M with a cc of low back pain.  This is going on for the past 4 days.  The patient describes it as low worse with movement twisting palpation.  The pain is so severe that he feels that he is unable to walk anymore.  He denies loss of bowel or bladder has had some subjective fevers and chills.  He denies any trauma to his back.  Denies prior surgery.  Denies numbness to his legs.   The history is provided by the patient and the spouse.  Back Pain   This is a new problem. The current episode started more than 2 days ago. The problem occurs constantly. The problem has not changed since onset.The pain is associated with no known injury. The pain is present in the lumbar spine. The quality of the pain is described as stabbing and shooting. The pain does not radiate. The pain is at a severity of 10/10. The pain is severe. The symptoms are aggravated by bending, twisting and certain positions. Associated symptoms include a fever and weakness. Pertinent negatives include no chest pain, no headaches and no abdominal pain. He has tried nothing for the symptoms. The treatment provided no relief.  Hypertension  Pertinent negatives include no chest pain, no abdominal pain, no headaches and no shortness of breath.    Past Medical History:  Diagnosis Date  . Chronic kidney disease   . History of kidney stones   . Hypertension   . Urolithiasis    cystoscopy in 2002    Patient Active Problem List   Diagnosis Date Noted  . CLL (chronic lymphocytic leukemia) ([place]) 01/28/2017  . Chronic renal failure 01/28/2017  . Nocturia 03/21/2014  . Bilateral leg pain 02/22/2014  . Eustachian tube dysfunction 10/21/2013  . Cough 10/04/2013  .  Vision changes 09/27/2013  . Noncompliance with medications 09/27/2013  . Acquired blepharoptosis 04/19/2013  . Gastritis and gastroduodenitis 03/14/2013  . CHRONIC KIDNEY DISEASE STAGE III (MODERATE) 02/11/2010  . Other ganglion and cyst of synovium, tendon, and bursa(727.49) 02/08/2010  . 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 CLL 07/26/2007  . ERECTILE DYSFUNCTION, SECONDARY TO MEDICATION 11/16/2006  . HYPERLIPIDEMIA 10/22/2006  . SCHIZOPHRENIA 10/22/2006  . DEPRESSION, MAJOR, RECURRENT 10/22/2006  . HEARING LOSS NOS OR DEAFNESS 10/22/2006  . HYPERTENSION, BENIGN SYSTEMIC 10/22/2006  . ASCVD 10/22/2006  . OSTEOARTHRITIS, MULTI SITES 10/22/2006    Past Surgical History:  Procedure Laterality Date  . CORONARY ANGIOPLASTY WITH STENT PLACEMENT  02/05/2004   drug eluting to LAD plus baloon 1st diagonal  . INGUINAL HERNIA REPAIR     bilateral  . KNEE ARTHROSCOPY     right, done in [place]  . UMBILICAL HERNIA REPAIR         Home Medications    Prior to Admission medications   Medication Sig Start Date End Date Taking? Authorizing Provider  metoprolol tartrate (LOPRESSOR) 50 MG tablet Take 50 mg by mouth daily. Patient is non compliant. 01/27/17   [provider]  morphine (MSIR) 15 MG tablet Take 0.5 tablets (7.5 mg total) by mouth every 4 (four) hours as needed for severe pain. 06/20/17   Deno Etienne, DO  Family History Family History  Problem Relation Age of Onset  . Diabetes Mother   . Coronary artery disease Mother   . Coronary artery disease Father        died  . Diabetes Father   . Diabetes Brother   . Diabetes Brother        died    Social History Social History  Substance Use Topics  . Smoking status: Former Smoker    Years: 31.00    Types: Cigarettes    Quit date: 12/22/1997  . Smokeless tobacco: Never Used  . Alcohol use No     Allergies   Ace inhibitors; Amlodipine besylate; Beta adrenergic blockers; and Hydrochlorothiazide   Review  of Systems Review of Systems  Constitutional: Positive for chills and fever.  HENT: Negative for congestion and facial swelling.   Eyes: Negative for discharge and visual disturbance.  Respiratory: Negative for shortness of breath.   Cardiovascular: Negative for chest pain and palpitations.  Gastrointestinal: Negative for abdominal pain, diarrhea and vomiting.  Musculoskeletal: Positive for back pain and gait problem. Negative for arthralgias and myalgias.  Skin: Negative for color change and rash.  Neurological: Positive for weakness. Negative for tremors, syncope and headaches.  Psychiatric/Behavioral: Negative for confusion and dysphoric mood.     Physical Exam Updated Vital Signs BP (!) 162/87   Pulse 77   Temp 97.8 F (36.6 C) (Oral)   Resp 20   Ht $R'5\' 8"'YM$  (1.727 m)   Wt 53.5 kg (118 lb)   SpO2 99%   BMI 17.94 kg/m   Physical Exam  Constitutional: He is oriented to person, place, and time.  Chronically ill-appearing  HENT:  Head: Normocephalic and atraumatic.  Eyes: Pupils are equal, round, and reactive to light. EOM are normal.  Neck: Normal range of motion. Neck supple. No JVD present.  Cardiovascular: Normal rate and regular rhythm.  Exam reveals no gallop and no friction rub.   No murmur heard. Pulmonary/Chest: No respiratory distress. He has no wheezes.  Abdominal: He exhibits no distension and no mass. There is no tenderness. There is no rebound and no guarding.  Musculoskeletal: Normal range of motion. He exhibits tenderness (mild ttp about the SI joints bilaterally).  Pulse motor and sensation is intact to the bilateral lower extremities  Neurological: He is alert and oriented to person, place, and time.  Skin: No rash noted. No pallor.  Psychiatric: He has a normal mood and affect. His behavior is normal.  Nursing note and vitals reviewed.    ED Treatments / Results  Labs (all labs ordered are listed, but only abnormal results are displayed) Labs  Reviewed  CBC WITH DIFFERENTIAL/PLATELET - Abnormal; Notable for the following:       Result Value   WBC 21.8 (*)    RBC 2.44 (*)    Hemoglobin 7.7 (*)    HCT 22.4 (*)    Platelets 127 (*)    Lymphs Abs 19.2 (*)    Monocytes Absolute 0.0 (*)    Eosinophils Absolute 0.9 (*)    All other components within normal limits  COMPREHENSIVE METABOLIC PANEL - Abnormal; Notable for the following:    CO2 21 (*)    Glucose, Bld 112 (*)    BUN 48 (*)    Creatinine, Ser 3.74 (*)    Calcium 8.6 (*)    Total Protein 6.1 (*)    Albumin 3.0 (*)    ALT 7 (*)    GFR calc non Af Wyvonnia Lora  15 (*)    GFR calc Af Amer 18 (*)    All other components within normal limits  URINALYSIS, ROUTINE W REFLEX MICROSCOPIC - Abnormal; Notable for the following:    Glucose, UA 50 (*)    Hgb urine dipstick MODERATE (*)    Protein, ur >=300 (*)    Bacteria, UA RARE (*)    All other components within normal limits  LIPASE, BLOOD    EKG  EKG Interpretation None       Radiology Ct L-spine No Charge  Result Date: 06/20/2017 CLINICAL DATA:  Severe back pain for 4 days. History of kidney stones, chronic back pain. EXAM: CT LUMBAR SPINE WITHOUT CONTRAST TECHNIQUE: Multidetector CT imaging of the lumbar spine was performed without intravenous contrast administration. Multiplanar CT image reconstructions were also generated. COMPARISON:  None. FINDINGS: SEGMENTATION: For the purposes of this report the last well-formed intervertebral disc space is reported as L5-S1. ALIGNMENT: Straightened lumbar lordosis. Grade 1 L4-5 retrolisthesis. No spondylolysis. VERTEBRAE: Vertebral bodies and posterior elements are intact. Moderate to severe L4-5 disc height loss with endplate sclerosis and marginal spurring compatible with degenerative disc. Remaining disc heights preserved. No destructive bony lesions. Bridging bilateral sacroiliac osteophytes. PARASPINAL AND OTHER SOFT TISSUES: Please see dedicated CT of abdomen and pelvis from same  day, reported separately. DISC LEVELS: L1-2 thru L3-4: No disc bulge, canal stenosis nor neural foraminal narrowing. L4-5: Retrolisthesis, mild facet arthropathy and ligamentum flavum redundancy resulting in mild canal stenosis and mild bilateral neural foraminal narrowing. L5-S1: Endplate spurring, mild facet arthropathy. Mild L5-S1 facet arthropathy no canal stenosis. Mild LEFT neural foraminal narrowing. IMPRESSION: 1. No acute fracture. 2. Grade 1 L4-5 retrolisthesis on degenerative basis. 3. Mild canal stenosis L4-5. Mild bilateral L4-5 and mild LEFT L5-S1 neural foraminal narrowing. Electronically Signed   By: Elon Alas M.D.   On: 06/20/2017 00:56   Ct Renal Stone Study  Result Date: 06/20/2017 CLINICAL DATA:  Acute onset of severe lower back pain. Initial encounter. EXAM: CT ABDOMEN AND PELVIS WITHOUT CONTRAST TECHNIQUE: Multidetector CT imaging of the abdomen and pelvis was performed following the standard protocol without IV contrast. COMPARISON:  CT of the abdomen and pelvis from 08/05/2006 FINDINGS: Lower chest: Small bilateral pleural effusions are noted. Interstitial prominence and hazy bibasilar opacities raise concern for mild interstitial edema. The heart is borderline normal in size. Hepatobiliary: The liver is unremarkable in appearance. The gallbladder is unremarkable in appearance. The common bile duct remains normal in caliber. Pancreas: The pancreas is within normal limits, though not well assessed without contrast. Spleen: The spleen is enlarged, measuring 16.5 cm in length. Adrenals/Urinary Tract: The adrenal glands are unremarkable in appearance. The kidneys are within normal limits. There is no evidence of hydronephrosis. No renal or ureteral stones are identified. No perinephric stranding is seen. Stomach/Bowel: The stomach is unremarkable in appearance. The small bowel is within normal limits. The appendix is normal in caliber, without evidence of appendicitis. Scattered  diverticulosis is noted along the ascending, descending and sigmoid colon. A likely large calcified epiploic appendage is noted at the right mid abdomen. Vascular/Lymphatic: Scattered calcification is seen along the abdominal aorta and its branches. The abdominal aorta is otherwise grossly unremarkable. The inferior vena cava is grossly unremarkable. No retroperitoneal lymphadenopathy is seen. No pelvic sidewall lymphadenopathy is identified. Reproductive: The bladder is mildly distended and grossly unremarkable. The prostate remains normal in size, with scattered calcification. Other: No additional soft tissue abnormalities are seen. Musculoskeletal: No acute osseous abnormalities are  identified. There is chronic intervertebral disc space narrowing at L4-L5. The visualized musculature is unremarkable in appearance. IMPRESSION: 1. No acute abnormality seen to explain the patient's symptoms. 2. Small bilateral pleural effusions. Interstitial prominence and bibasilar airspace opacities raise concern for mild interstitial edema. 3. Splenomegaly. 4. Scattered diverticulosis along the ascending, descending and sigmoid colon, without definite evidence of diverticulitis. 5. Scattered aortic atherosclerosis. Electronically Signed   By: Garald Balding M.D.   On: 06/20/2017 00:50    Procedures Procedures (including critical care time)  Medications Ordered in ED Medications  oxyCODONE-acetaminophen (PERCOCET/ROXICET) 5-325 MG per tablet 1 tablet (1 tablet Oral Given 06/19/17 2238)  oxyCODONE-acetaminophen (PERCOCET/ROXICET) 5-325 MG per tablet (not administered)  sodium chloride 0.9 % bolus 1,000 mL (0 mLs Intravenous Stopped 06/20/17 0045)  morphine 4 MG/ML injection 4 mg (4 mg Intravenous Given 06/20/17 0038)  ondansetron (ZOFRAN) injection 4 mg (4 mg Intravenous Given 06/20/17 0037)     Initial Impression / Assessment and Plan / ED Course  I have reviewed the triage vital signs and the nursing  notes.  Pertinent labs & imaging results that were available during my care of the patient were reviewed by me and considered in my medical decision making (see chart for details).     71 yo M with a chief complaint of low back pain.  Patient has a history of CLL.  Patient is complaining of some severe right-sided low back pain.  Does have a history of stones in the past.  Will obtain a CT stone study as well as reformats of the L-spine.  Check labs and urine.  Workup is unremarkable.  Patient feeling much better after medications.  We will have him follow-up with his family physician.  4:29 AM:  I have discussed the diagnosis/risks/treatment options with the patient and family and believe the pt to be eligible for discharge home to follow-up with PCP. We also discussed returning to the ED immediately if new or worsening sx occur. We discussed the sx which are most concerning (e.g., cauda equina s/x) that necessitate immediate return. Medications administered to the patient during their visit and any new prescriptions provided to the patient are listed below.  Medications given during this visit Medications  oxyCODONE-acetaminophen (PERCOCET/ROXICET) 5-325 MG per tablet 1 tablet (1 tablet Oral Given 06/19/17 2238)  oxyCODONE-acetaminophen (PERCOCET/ROXICET) 5-325 MG per tablet (not administered)  sodium chloride 0.9 % bolus 1,000 mL (0 mLs Intravenous Stopped 06/20/17 0045)  morphine 4 MG/ML injection 4 mg (4 mg Intravenous Given 06/20/17 0038)  ondansetron (ZOFRAN) injection 4 mg (4 mg Intravenous Given 06/20/17 0037)     The patient appears reasonably screen and/or stabilized for discharge and I doubt any other medical condition or other [facility] requiring further screening, evaluation, or treatment in the ED at this time prior to discharge.    Final Clinical Impressions(s) / ED Diagnoses   Final diagnoses:  Acute right-sided low back pain without sciatica    New Prescriptions Discharge  Medication List as of 06/20/2017  2:37 AM    START taking these medications   Details  morphine (MSIR) 15 MG tablet Take 0.5 tablets (7.5 mg total) by mouth every 4 (four) hours as needed for severe pain., Starting Sat 06/20/2017, Print         Tyrone Nine, Linna Hoff, DO 06/20/17 [phone]

## 2017-06-20 NOTE — Discharge Instructions (Signed)
Take tylenol 1000mg(2 extra strength) four times a day.  ° °Then take the pain medicine if you feel like you need it. Narcotics do not help with the pain, they only make you care about it less.  You can become addicted to this, people may break into your house to steal it.  It will constipate you.  If you drive under the influence of this medicine you can get a DUI.   ° °

## 2017-06-22 LAB — PATHOLOGIST SMEAR REVIEW

## 2017-06-24 ENCOUNTER — Encounter: Payer: Self-pay | Admitting: Emergency Medicine

## 2017-06-24 ENCOUNTER — Ambulatory Visit (INDEPENDENT_AMBULATORY_CARE_PROVIDER_SITE_OTHER): Payer: Medicare Other | Admitting: Emergency Medicine

## 2017-06-24 VITALS — BP 160/80 | HR 79 | Temp 98.4°F | Resp 16 | Ht 64.25 in | Wt 125.2 lb

## 2017-06-24 DIAGNOSIS — L299 Pruritus, unspecified: Secondary | ICD-10-CM

## 2017-06-24 DIAGNOSIS — D631 Anemia in chronic kidney disease: Secondary | ICD-10-CM | POA: Insufficient documentation

## 2017-06-24 DIAGNOSIS — N183 Chronic kidney disease, stage 3 unspecified: Secondary | ICD-10-CM

## 2017-06-24 DIAGNOSIS — I10 Essential (primary) hypertension: Secondary | ICD-10-CM

## 2017-06-24 DIAGNOSIS — C9110 Chronic lymphocytic leukemia of B-cell type not having achieved remission: Secondary | ICD-10-CM

## 2017-06-24 DIAGNOSIS — D649 Anemia, unspecified: Secondary | ICD-10-CM

## 2017-06-24 DIAGNOSIS — C9190 Lymphoid leukemia, unspecified not having achieved remission: Secondary | ICD-10-CM

## 2017-06-24 DIAGNOSIS — N184 Chronic kidney disease, stage 4 (severe): Secondary | ICD-10-CM

## 2017-06-24 MED ORDER — AMLODIPINE BESYLATE 5 MG PO TABS: 5.0000 mg | ORAL_TABLET | Freq: Every day | ORAL | 3 refills | Status: DC

## 2017-06-24 NOTE — Patient Instructions (Addendum)
     IF you received an x-ray today, you will receive an invoice from [facility]. Please contact [facility] at [phone] with questions or concerns regarding your invoice.   IF you received labwork today, you will receive an invoice from [place]. Please contact [facility] at [phone] with questions or concerns regarding your invoice.   Our billing staff will not be able to assist you with questions regarding bills from these companies.  You will be contacted with the lab results as soon as they are available. The fastest way to get your results is to activate your My Chart account. Instructions are located on the last page of this paperwork. If you have not heard from [place] regarding the results in 2 weeks, please contact this office.     Enfermedad renal crnica en los adultos (Chronic Kidney Disease, Adult) La enfermedad renal crnica se produce cuando los riones sufren un dao durante un perodo de 2meses o ms. Los riones son dos rganos que cumplen muchas funciones importantes en el organismo. Estas funciones incluyen las siguientes:  Eliminar desechos y el exceso de [place].  Producir hormonas que [employer] cantidad de lquido [employer] tejidos y los vasos sanguneos.  Mantener la cantidad correcta de lquidos y de sustancias qumicas en el organismo. [place] veces, esta afeccin no desaparece, pero a menudo puede ser [employer]. Se deben tomar medidas para frenar el dao renal o evitar que empeore. [employer], los riones pueden dejar de funcionar. CUIDADOS EN EL HOGAR  Siga su dieta segn las indicaciones de su mdico. Tal vez deba evitar el alcohol, los alimentos salados (sodio) y aquellos con alto contenido de potasio, calcio y protenas.  Tome los medicamentos de venta libre y los recetados solamente como se lo haya indicado el mdico. No tome ningn medicamento nuevo a menos que el mdico lo autorice. Jessie y los minerales. ? Los medicamentos y los suplementos nutricionales pueden agravar el dao renal. ? Es posible que el mdico deba modificar la cantidad de medicamentos que toma.  No use productos que contengan tabaco. Estos incluyen cigarrillos, tabaco para mascar y Psychologist, sport and exercise. Si necesita ayuda para dejar de fumar, consulte al mdico.  Concurra a todas las visitas de control como se lo haya indicado el mdico. Esto es importante.  Controlar su presin arterial. Informe al mdico si tiene cambios en la presin arterial.  Alcance un peso saludable. Mantenga ese peso. Si necesita ayuda para lograrlo, consulte al mdico.  Comience o contine un plan de ejercicios. Intente hacer ejercicios al menos 46minutos al da, 5das a la semana.  Mantngase al da con las vacunas como se lo haya indicado el mdico.  SOLICITE AYUDA SI:  Los sntomas empeoran.  Aparecen nuevos sntomas.  SOLICITE AYUDA DE INMEDIATO SI:  Tiene sntomas de enfermedad renal terminal. Estos incluyen los siguientes: ? Dolores de [place]. ? Piel ms oscura o ms clara que lo normal. ? Adormecimiento de las manos o de los pies. ? Aparecen hematomas con facilidad. ? Hipo frecuente. ? Dolor en el pecho. ? Falta de aire. ? Ausencia de la [employer].  Tiene fiebre.  Orina muy [place] dolor o sangra al [place].  Esta informacin no tiene Marine scientist el consejo del mdico. Asegrese de hacerle al mdico cualquier pregunta que tenga. Document Released: 09/13/2010 Document Revised: 12/03/2015 Document Reviewed: 04/09/2012 Elsevier Interactive Patient Education  2017 [employer].

## 2017-06-24 NOTE — Progress Notes (Addendum)
Nathan Richards 71 y.o.   Chief Complaint  Patient presents with  . Follow-up    ED visit on 06/19/17 for RIGHT SIDED LOW BACK PAIN  . Edema    HANDS and feet with itching x 3 days    HISTORY OF PRESENT ILLNESS: This is a 71 y.o. male complaining of itching to legs and mild swelling. Seen in the ED 10/26 due to musculoskeletal low back pain. Has h/o HTN but not taking any meds now. Otherwise feels well. Denies SOB, chest pain, n/v, fever, or any other significant symptoms.  Pertinent labs & imaging results that were available during my care of the patient were reviewed by me and considered in my medical decision making (see chart for details).   HPI   Prior to Admission medications   Medication Sig Start Date End Date Taking? Authorizing Provider  amLODipine (NORVASC) 5 MG tablet Take 1 tablet (5 mg total) by mouth daily. 06/24/17   Nathan Pollen, MD  morphine (MSIR) 15 MG tablet Take 0.5 tablets (7.5 mg total) by mouth every 4 (four) hours as needed for severe pain. 06/20/17   Nathan Etienne, DO    Allergies  Allergen Reactions  . Ace Inhibitors     REACTION: cough  . Amlodipine Besylate     REACTION: Impotence  . Beta Adrenergic Blockers     REACTION: Impotence  . Hydrochlorothiazide Other (See Comments)    May have contributed to his hypercalcemia    Patient Active Problem List   Diagnosis Date Noted  . CLL (chronic lymphocytic leukemia) ([place]) 01/28/2017  . Chronic renal failure 01/28/2017  . Nocturia 03/21/2014  . Bilateral leg pain 02/22/2014  . Eustachian tube dysfunction 10/21/2013  . Cough 10/04/2013  . Vision changes 09/27/2013  . Noncompliance with medications 09/27/2013  . Acquired blepharoptosis 04/19/2013  . Gastritis and gastroduodenitis 03/14/2013  . CHRONIC KIDNEY DISEASE STAGE III (MODERATE) 02/11/2010  . Other ganglion and cyst of synovium, tendon, and bursa(727.49) 02/08/2010  . 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 CLL 07/26/2007  .  ERECTILE DYSFUNCTION, SECONDARY TO MEDICATION 11/16/2006  . HYPERLIPIDEMIA 10/22/2006  . SCHIZOPHRENIA 10/22/2006  . DEPRESSION, MAJOR, RECURRENT 10/22/2006  . HEARING LOSS NOS OR DEAFNESS 10/22/2006  . HYPERTENSION, BENIGN SYSTEMIC 10/22/2006  . ASCVD 10/22/2006  . OSTEOARTHRITIS, MULTI SITES 10/22/2006    Past Medical History:  Diagnosis Date  . Chronic kidney disease   . History of kidney stones   . Hypertension   . Urolithiasis    cystoscopy in 2002    Past Surgical History:  Procedure Laterality Date  . CORONARY ANGIOPLASTY WITH STENT PLACEMENT  02/05/2004   drug eluting to LAD plus baloon 1st diagonal  . INGUINAL HERNIA REPAIR     bilateral  . KNEE ARTHROSCOPY     right, done in [place]  . UMBILICAL HERNIA REPAIR      Social History   Social History  . Marital status: Married    Spouse name: N/A  . Number of children: N/A  . Years of education: N/A   Occupational History  .  Unemployed   Social History Main Topics  . Smoking status: Former Smoker    Years: 31.00    Types: Cigarettes    Quit date: 12/22/1997  . Smokeless tobacco: Never Used  . Alcohol use No  . Drug use: No     Comment: remote use of MJ and cocaine  . Sexual activity: No   Other Topics Concern  . Not on  file   Social History Narrative   Born [place] lived in [place]. For years before moving to [place]   Married, currently going through a divorce. Wife is in [place]   Lives alone    He has a son living here from a prior relationship   He and his current wife have a daughter and son together   Another son was killed at age 64   2 grand daughters in [place], [place]   Brother, Nathan Richards lives locally   Disabled by psychiatric disease.    [employer]    Family History  Problem Relation Age of Onset  . Diabetes Mother   . Coronary artery disease Mother   . Coronary artery disease Father        died  . Diabetes Father   . Diabetes Brother   . Diabetes Brother         died     Review of Systems  Constitutional: Negative.  Negative for chills and fever.  HENT: Negative.   Eyes: Negative.   Respiratory: Negative.  Negative for cough and shortness of breath.   Cardiovascular: Positive for leg swelling. Negative for chest pain and palpitations.  Gastrointestinal: Negative for abdominal pain, blood in stool, diarrhea, melena, nausea and vomiting.  Genitourinary: Negative for dysuria and hematuria.  Musculoskeletal: Negative for back pain, myalgias and neck pain.  Skin: Positive for itching.  Neurological: Negative.  Negative for dizziness and headaches.  Endo/Heme/Allergies: Negative.   All other systems reviewed and are negative.   Vitals:   06/24/17 1150 06/24/17 1154  BP: (!) 142/66 (!) 160/80  Pulse: 79   Resp: 16   Temp: 98.4 F (36.9 C)   SpO2: 99%     Physical Exam  Constitutional: He is oriented to person, place, and time. He appears well-developed and well-nourished.  HENT:  Head: Normocephalic and atraumatic.  Nose: Nose normal.  Mouth/Throat: Oropharynx is clear and moist.  Eyes: Pupils are equal, round, and reactive to light. Conjunctivae and EOM are normal.  Neck: Normal range of motion. Neck supple. No JVD present. No thyromegaly present.  Cardiovascular: Normal rate, regular rhythm and normal heart sounds.   Pulmonary/Chest: Effort normal and breath sounds normal.  Abdominal: Soft. Bowel sounds are normal. He exhibits no distension. There is no tenderness. There is no rebound and no guarding.  Musculoskeletal: Normal range of motion. He exhibits edema (LE: +2 pitting).  Lymphadenopathy:    He has no cervical adenopathy.  Neurological: He is alert and oriented to person, place, and time. No sensory deficit. He exhibits normal muscle tone.  Skin: Skin is warm and dry. Capillary refill takes less than 2 seconds. Rash (scratch marks LE) noted. There is pallor.  pale  Psychiatric: He has a normal mood and affect. His behavior is  normal.  Vitals reviewed.  A total of 30 minutes was spent in the room with the patient, greater than 50% of which was in counseling/coordination of care regarding chronic medical problems.   ASSESSMENT & PLAN: Nathan Richards was seen today for follow-up and edema.  Diagnoses and all orders for this visit:  CLL (chronic lymphocytic leukemia) (HCC)  Chronic renal failure, stage 3 (moderate) (HCC)  Itching  Essential hypertension  Chronic anemia  Other orders -     amLODipine (NORVASC) 5 MG tablet; Take 1 tablet (5 mg total) by mouth daily.    Patient Instructions       IF you received an x-ray today, you  will receive an invoice from [facility]. Please contact [facility] at [phone] with questions or concerns regarding your invoice.   IF you received labwork today, you will receive an invoice from [facility]. Please contact [facility] at [phone] with questions or concerns regarding your invoice.   Our billing staff will not be able to assist you with questions regarding bills from these companies.  You will be contacted with the lab results as soon as they are available. The fastest way to get your results is to activate your My Chart account. Instructions are located on the last page of this paperwork. If you have not heard from [place] regarding the results in 2 weeks, please contact this office.     Enfermedad renal crnica en los adultos (Chronic Kidney Disease, Adult) La enfermedad renal crnica se produce cuando los riones sufren un dao durante un perodo de 3meses o ms. Los riones son dos rganos que cumplen muchas funciones importantes en el organismo. Estas funciones incluyen las siguientes:  Eliminar desechos y el exceso de [place].  Producir hormonas que [employer] cantidad de lquido [employer] tejidos y los vasos sanguneos.  Mantener la cantidad correcta de lquidos y de sustancias qumicas en el organismo. [place]  veces, esta afeccin no desaparece, pero a menudo puede ser [employer]. Se deben tomar medidas para frenar el dao renal o evitar que empeore. Walt [employer], los riones pueden dejar de funcionar. CUIDADOS EN EL HOGAR  Siga su dieta segn las indicaciones de su mdico. Tal vez deba evitar el alcohol, los alimentos salados (sodio) y aquellos con alto contenido de potasio, calcio y protenas.  Tome los medicamentos de venta libre y los recetados solamente como se lo haya indicado el mdico. No tome ningn medicamento nuevo a menos que el mdico lo autorice. [place] y los minerales. ? Los medicamentos y los suplementos nutricionales pueden agravar el dao renal. ? Es posible que el mdico deba modificar la cantidad de medicamentos que toma.  No use productos que contengan tabaco. Estos incluyen cigarrillos, tabaco para mascar y Psychologist, sport and exercise. Si necesita ayuda para dejar de fumar, consulte al mdico.  Concurra a todas las visitas de control como se lo haya indicado el mdico. Esto es importante.  Controlar su presin arterial. Informe al mdico si tiene cambios en la presin arterial.  Alcance un peso saludable. Mantenga ese peso. Si necesita ayuda para lograrlo, consulte al mdico.  Comience o contine un plan de ejercicios. Intente hacer ejercicios al menos 19minutos al da, 5das a la semana.  Mantngase al da con las vacunas como se lo haya indicado el mdico.  SOLICITE AYUDA SI:  Los sntomas empeoran.  Aparecen nuevos sntomas.  SOLICITE AYUDA DE INMEDIATO SI:  Tiene sntomas de enfermedad renal terminal. Estos incluyen los siguientes: ? Dolores de [place]. ? Piel ms oscura o ms clara que lo normal. ? Adormecimiento de las manos o de los pies. ? Aparecen hematomas con facilidad. ? Hipo frecuente. ? Dolor en el pecho. ? Falta de aire. ? Ausencia de la [employer].  Tiene fiebre.  Orina muy Armstrong dolor o sangra al  [place].  Esta informacin no tiene Marine scientist el consejo del mdico. Asegrese de hacerle al mdico cualquier pregunta que tenga. Document Released: 09/13/2010 Document Revised: 12/03/2015 Document Reviewed: 04/09/2012 Elsevier Interactive Patient Education  2017 Elsevier Inc.      Agustina Caroli, MD [facility]  [place]

## 2017-07-22 ENCOUNTER — Other Ambulatory Visit: Payer: Self-pay

## 2017-07-22 ENCOUNTER — Encounter: Payer: Self-pay | Admitting: Emergency Medicine

## 2017-07-22 ENCOUNTER — Emergency Department (HOSPITAL_COMMUNITY): Payer: Medicare Other

## 2017-07-22 ENCOUNTER — Ambulatory Visit (INDEPENDENT_AMBULATORY_CARE_PROVIDER_SITE_OTHER): Payer: Medicare Other

## 2017-07-22 ENCOUNTER — Ambulatory Visit (INDEPENDENT_AMBULATORY_CARE_PROVIDER_SITE_OTHER): Payer: Medicare Other | Admitting: Emergency Medicine

## 2017-07-22 ENCOUNTER — Observation Stay (HOSPITAL_COMMUNITY)
Admission: EM | Admit: 2017-07-22 | Discharge: 2017-07-24 | Disposition: A | Discharge status: Home / Self Care | Payer: Medicare Other | Attending: Internal Medicine | Admitting: Internal Medicine

## 2017-07-22 VITALS — BP 138/88 | HR 87 | Temp 97.8°F | Resp 16 | Ht 63.0 in | Wt 120.8 lb

## 2017-07-22 DIAGNOSIS — D649 Anemia, unspecified: Secondary | ICD-10-CM

## 2017-07-22 DIAGNOSIS — N184 Chronic kidney disease, stage 4 (severe): Secondary | ICD-10-CM | POA: Diagnosis not present

## 2017-07-22 DIAGNOSIS — I130 Hypertensive heart and chronic kidney disease with heart failure and stage 1 through stage 4 chronic kidney disease, or unspecified chronic kidney disease: Principal | ICD-10-CM | POA: Insufficient documentation

## 2017-07-22 DIAGNOSIS — J90 Pleural effusion, not elsewhere classified: Secondary | ICD-10-CM | POA: Diagnosis not present

## 2017-07-22 DIAGNOSIS — I509 Heart failure, unspecified: Secondary | ICD-10-CM

## 2017-07-22 DIAGNOSIS — Z87891 Personal history of nicotine dependence: Secondary | ICD-10-CM | POA: Insufficient documentation

## 2017-07-22 DIAGNOSIS — I2510 Atherosclerotic heart disease of native coronary artery without angina pectoris: Secondary | ICD-10-CM | POA: Insufficient documentation

## 2017-07-22 DIAGNOSIS — D630 Anemia in neoplastic disease: Secondary | ICD-10-CM | POA: Insufficient documentation

## 2017-07-22 DIAGNOSIS — D631 Anemia in chronic kidney disease: Secondary | ICD-10-CM | POA: Insufficient documentation

## 2017-07-22 DIAGNOSIS — C9190 Lymphoid leukemia, unspecified not having achieved remission: Secondary | ICD-10-CM | POA: Diagnosis present

## 2017-07-22 DIAGNOSIS — N289 Disorder of kidney and ureter, unspecified: Secondary | ICD-10-CM

## 2017-07-22 DIAGNOSIS — Z79899 Other long term (current) drug therapy: Secondary | ICD-10-CM | POA: Insufficient documentation

## 2017-07-22 DIAGNOSIS — N183 Chronic kidney disease, stage 3 unspecified: Secondary | ICD-10-CM

## 2017-07-22 DIAGNOSIS — C9110 Chronic lymphocytic leukemia of B-cell type not having achieved remission: Secondary | ICD-10-CM | POA: Insufficient documentation

## 2017-07-22 DIAGNOSIS — R0602 Shortness of breath: Secondary | ICD-10-CM | POA: Diagnosis not present

## 2017-07-22 DIAGNOSIS — Z87442 Personal history of urinary calculi: Secondary | ICD-10-CM | POA: Insufficient documentation

## 2017-07-22 DIAGNOSIS — I5041 Acute combined systolic (congestive) and diastolic (congestive) heart failure: Secondary | ICD-10-CM | POA: Insufficient documentation

## 2017-07-22 DIAGNOSIS — R0600 Dyspnea, unspecified: Secondary | ICD-10-CM | POA: Diagnosis present

## 2017-07-22 DIAGNOSIS — Z9114 Patient's other noncompliance with medication regimen: Secondary | ICD-10-CM

## 2017-07-22 DIAGNOSIS — I10 Essential (primary) hypertension: Secondary | ICD-10-CM | POA: Diagnosis present

## 2017-07-22 HISTORY — DX: Heart failure, unspecified: I50.9

## 2017-07-22 HISTORY — DX: Chronic lymphocytic leukemia of B-cell type not having achieved remission: C91.10

## 2017-07-22 HISTORY — DX: Dyspnea, unspecified: R06.00

## 2017-07-22 LAB — CBC WITH DIFFERENTIAL/PLATELET
BAND NEUTROPHILS: 0 %
BASOS ABS: 0 10*3/uL (ref 0.0–0.1)
Basophils Relative: 0 %
Blasts: 0 %
EOS PCT: 9 %
Eosinophils Absolute: 2.2 10*3/uL — ABNORMAL HIGH (ref 0.0–0.7)
HCT: 22.9 % — ABNORMAL LOW (ref 39.0–52.0)
Hemoglobin: 7.7 g/dL — ABNORMAL LOW (ref 13.0–17.0)
LYMPHS ABS: 16.7 10*3/uL — AB (ref 0.7–4.0)
Lymphocytes Relative: 67 %
MCH: 32.2 pg (ref 26.0–34.0)
MCHC: 33.6 g/dL (ref 30.0–36.0)
MCV: 95.8 fL (ref 78.0–100.0)
METAMYELOCYTES PCT: 0 %
MONOS PCT: 1 %
Monocytes Absolute: 0.2 10*3/uL (ref 0.1–1.0)
Myelocytes: 0 %
NEUTROS ABS: 5.7 10*3/uL (ref 1.7–7.7)
Neutrophils Relative %: 23 %
OTHER: 0 %
PLATELETS: 131 10*3/uL — AB (ref 150–400)
Promyelocytes Absolute: 0 %
RBC: 2.39 MIL/uL — AB (ref 4.22–5.81)
RDW: 15.6 % — AB (ref 11.5–15.5)
WBC: 24.8 10*3/uL — AB (ref 4.0–10.5)
nRBC: 0 /100 WBC

## 2017-07-22 LAB — POCT CBC
Granulocyte percent: 12.5 %G — AB (ref 37–80)
HCT, POC: 23.2 % — AB (ref 43.5–53.7)
HEMOGLOBIN: 7.5 g/dL — AB (ref 14.1–18.1)
Lymph, poc: 25.3 — AB (ref 0.6–3.4)
MCH: 31.5 pg — AB (ref 27–31.2)
MCHC: 32.4 g/dL (ref 31.8–35.4)
MCV: 97 fL (ref 80–97)
MID (CBC): 0.8 (ref 0–0.9)
MPV: 5.8 fL (ref 0–99.8)
PLATELET COUNT, POC: 168 10*3/uL (ref 142–424)
POC Granulocyte: 3.7 (ref 2–6.9)
POC LYMPH PERCENT: 84.8 %L — AB (ref 10–50)
POC MID %: 2.7 %M (ref 0–12)
RBC: 2.39 M/uL — AB (ref 4.69–6.13)
RDW, POC: 15.4 %
WBC: 29.8 10*3/uL — AB (ref 4.6–10.2)

## 2017-07-22 LAB — COMPREHENSIVE METABOLIC PANEL
ALBUMIN: 3.1 g/dL — AB (ref 3.5–5.0)
ALT: 11 U/L — AB (ref 17–63)
AST: 23 U/L (ref 15–41)
Alkaline Phosphatase: 116 U/L (ref 38–126)
Anion gap: 6 (ref 5–15)
BUN: 47 mg/dL — AB (ref 6–20)
CHLORIDE: 108 mmol/L (ref 101–111)
CO2: 20 mmol/L — AB (ref 22–32)
CREATININE: 3.65 mg/dL — AB (ref 0.61–1.24)
Calcium: 9 mg/dL (ref 8.9–10.3)
GFR calc Af Amer: 18 mL/min — ABNORMAL LOW (ref >60)
GFR calc non Af Amer: 16 mL/min — ABNORMAL LOW (ref >60)
GLUCOSE: 118 mg/dL — AB (ref 65–99)
Potassium: 4.5 mmol/L (ref 3.5–5.1)
Sodium: 134 mmol/L — ABNORMAL LOW (ref 135–145)
Total Bilirubin: 0.6 mg/dL (ref 0.3–1.2)
Total Protein: 6.7 g/dL (ref 6.5–8.1)

## 2017-07-22 LAB — I-STAT TROPONIN, ED: Troponin i, poc: 0.04 ng/mL (ref 0.00–0.08)

## 2017-07-22 LAB — BRAIN NATRIURETIC PEPTIDE: B Natriuretic Peptide: 3324.5 pg/mL — ABNORMAL HIGH (ref 0.0–100.0)

## 2017-07-22 LAB — TROPONIN I: Troponin I: <0.03 ng/mL (ref <0.03)

## 2017-07-22 MED ORDER — AMLODIPINE BESYLATE 5 MG PO TABS
5.0000 mg | ORAL_TABLET | Freq: Every day | ORAL | Status: DC
  Administered 2017-07-22 – 2017-07-24 (×3): 5 mg via ORAL
  Filled 2017-07-22 (×3): qty 1

## 2017-07-22 MED ORDER — SODIUM CHLORIDE 0.9% FLUSH
3.0000 mL | Freq: Two times a day (BID) | INTRAVENOUS | Status: DC
  Administered 2017-07-23 – 2017-07-24 (×4): 3 mL via INTRAVENOUS

## 2017-07-22 MED ORDER — OXYCODONE HCL 5 MG PO TABS: 5.0000 mg | ORAL_TABLET | Freq: Four times a day (QID) | ORAL | Status: DC | PRN

## 2017-07-22 MED ORDER — HEPARIN SODIUM (PORCINE) 5000 UNIT/ML IJ SOLN
5000.0000 [IU] | Freq: Three times a day (TID) | INTRAMUSCULAR | Status: DC
  Administered 2017-07-23 – 2017-07-24 (×5): 5000 [IU] via SUBCUTANEOUS
  Filled 2017-07-22 (×5): qty 1

## 2017-07-22 MED ORDER — FUROSEMIDE 10 MG/ML IJ SOLN
40.0000 mg | Freq: Two times a day (BID) | INTRAMUSCULAR | Status: DC
  Administered 2017-07-23 – 2017-07-24 (×3): 40 mg via INTRAVENOUS
  Filled 2017-07-22 (×3): qty 4

## 2017-07-22 MED ORDER — FUROSEMIDE 10 MG/ML IJ SOLN
40.0000 mg | Freq: Once | INTRAMUSCULAR | Status: AC
  Administered 2017-07-22: 40 mg via INTRAVENOUS
  Filled 2017-07-22: qty 4

## 2017-07-22 NOTE — ED Provider Notes (Signed)
[facility] EMERGENCY DEPARTMENT Provider Note   CSN: [id number] Arrival date & time: 07/22/17  1529     History   Chief Complaint No chief complaint on file.   HPI Nathan Richards is a 71 y.o. male.  HPI Patient presents with shortness of breath.  Has had over the last month or 2.  Has been seen by primary care doctor for.  History of chronic renal insufficiency.  Thought now to be in CHF.  Also has an anemia.  Hemoglobin is 7.  Reportedly has chronic edema also.  History of CLL.  Has had occasional chest pain.  Has had worsening swelling in his legs.  Has had to wake up at night gasping for breath.  States he has had no other difficulty with activity but his family member states that he has had a lot of trouble walking. Past Medical History:  Diagnosis Date  . Chronic kidney disease   . History of kidney stones   . Hypertension   . Urolithiasis    cystoscopy in 2002    Patient Active Problem List   Diagnosis Date Noted  . Chronic anemia 06/24/2017  . Itching 06/24/2017  . CLL (chronic lymphocytic leukemia) ([place]) 01/28/2017  . Chronic renal failure 01/28/2017  . Nocturia 03/21/2014  . Bilateral leg pain 02/22/2014  . Eustachian tube dysfunction 10/21/2013  . Cough 10/04/2013  . Vision changes 09/27/2013  . Noncompliance with medications 09/27/2013  . Acquired blepharoptosis 04/19/2013  . Gastritis and gastroduodenitis 03/14/2013  . CHRONIC KIDNEY DISEASE STAGE III (MODERATE) 02/11/2010  . Other ganglion and cyst of synovium, tendon, and bursa(727.49) 02/08/2010  . 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 CLL 07/26/2007  . ERECTILE DYSFUNCTION, SECONDARY TO MEDICATION 11/16/2006  . HYPERLIPIDEMIA 10/22/2006  . SCHIZOPHRENIA 10/22/2006  . DEPRESSION, MAJOR, RECURRENT 10/22/2006  . HEARING LOSS NOS OR DEAFNESS 10/22/2006  . Essential hypertension 10/22/2006  . ASCVD 10/22/2006  . OSTEOARTHRITIS, MULTI SITES 10/22/2006    Past Surgical History:    Procedure Laterality Date  . CORONARY ANGIOPLASTY WITH STENT PLACEMENT  02/05/2004   drug eluting to LAD plus baloon 1st diagonal  . INGUINAL HERNIA REPAIR     bilateral  . KNEE ARTHROSCOPY     right, done in [place]  . UMBILICAL HERNIA REPAIR         Home Medications    Prior to Admission medications   Medication Sig Start Date End Date Taking? Authorizing Provider  amLODipine (NORVASC) 5 MG tablet Take 1 tablet (5 mg total) by mouth daily. Patient not taking: Reported on 07/22/2017 06/24/17   Horald Pollen, MD  morphine (MSIR) 15 MG tablet Take 0.5 tablets (7.5 mg total) by mouth every 4 (four) hours as needed for severe pain. Patient not taking: Reported on 07/22/2017 06/20/17   Deno Etienne, DO    Family History Family History  Problem Relation Age of Onset  . Diabetes Mother   . Coronary artery disease Mother   . Coronary artery disease Father        died  . Diabetes Father   . Diabetes Brother   . Diabetes Brother        died    Social History Social History   Tobacco Use  . Smoking status: Former Smoker    Years: 31.00    Types: Cigarettes    Last attempt to quit: 12/22/1997    Years since quitting: 19.5  . Smokeless tobacco: Never Used  Substance Use Topics  .  Alcohol use: No  . Drug use: No    Comment: remote use of MJ and cocaine     Allergies   Ace inhibitors; Amlodipine besylate; Beta adrenergic blockers; and Hydrochlorothiazide   Review of Systems Review of Systems  Constitutional: Negative for fever.  HENT: Negative for congestion.   Respiratory: Positive for shortness of breath.   Cardiovascular: Positive for leg swelling. Negative for chest pain.  Gastrointestinal: Negative for abdominal distention.  Genitourinary: Negative for flank pain and hematuria.  Musculoskeletal: Negative for back pain.  Neurological: Negative for weakness and numbness.  Hematological: Negative for adenopathy.  Psychiatric/Behavioral: Negative for  decreased concentration.     Physical Exam Updated Vital Signs BP (!) 186/106   Pulse 87   Temp 97.7 F (36.5 C) (Oral)   Resp 19   Ht 5' 2.5" (1.588 m)   SpO2 100%   BMI 21.74 kg/m   Physical Exam  Constitutional: He appears well-developed.  HENT:  Head: Atraumatic.  Eyes: EOM are normal.  Cardiovascular: Normal rate.  Pulmonary/Chest:  Rales bilateral bases up to the mid lung fields somewhat dyspneic with regular conversations.  Abdominal: Soft.  Musculoskeletal: He exhibits edema.  Pitting edema bilateral lower extremities.  Neurological: He is alert.  Skin: Skin is warm. Capillary refill takes less than 2 seconds.  Tattoo of 13 to the left hand.  Psychiatric: He has a normal mood and affect.     ED Treatments / Results  Labs (all labs ordered are listed, but only abnormal results are displayed) Labs Reviewed  CBC WITH DIFFERENTIAL/PLATELET - Abnormal; Notable for the following components:      Result Value   WBC 24.8 (*)    RBC 2.39 (*)    Hemoglobin 7.7 (*)    HCT 22.9 (*)    RDW 15.6 (*)    Platelets 131 (*)    Lymphs Abs 16.7 (*)    Eosinophils Absolute 2.2 (*)    All other components within normal limits  COMPREHENSIVE METABOLIC PANEL - Abnormal; Notable for the following components:   Sodium 134 (*)    CO2 20 (*)    Glucose, Bld 118 (*)    BUN 47 (*)    Creatinine, Ser 3.65 (*)    Albumin 3.1 (*)    ALT 11 (*)    GFR calc non Af Amer 16 (*)    GFR calc Af Amer 18 (*)    All other components within normal limits  BRAIN NATRIURETIC PEPTIDE - Abnormal; Notable for the following components:   B Natriuretic Peptide 3,324.5 (*)    All other components within normal limits  I-STAT TROPONIN, ED  POC OCCULT BLOOD, ED    EKG  EKG Interpretation  Date/Time:  Wednesday July 22 2017 15:36:59 EST Ventricular Rate:  79 PR Interval:    QRS Duration: 79 QT Interval:  385 QTC Calculation: 442 R Axis:   85 Text Interpretation:  Sinus rhythm  Borderline right axis deviation LVH with secondary repolarization abnormality LVH is new Confirmed by Davonna Belling [id number]) on 07/22/2017 4:18:35 PM       Radiology Dg Chest 2 View  Result Date: 07/22/2017 CLINICAL DATA:  Short of breath EXAM: CHEST  2 VIEW COMPARISON:  None. FINDINGS: COPD with pulmonary hyperinflation. Cardiac enlargement with heart failure. Interstitial edema and small bilateral effusions. Mild bibasilar atelectasis. IMPRESSION: COPD. Congestive heart failure with mild edema and bilateral effusions. Electronically Signed   By: Franchot Gallo M.D.   On: 07/22/2017 14:06  Dg Chest Port 1 View  Result Date: 07/22/2017 CLINICAL DATA:  71 y/o  M; shortness of breath. EXAM: PORTABLE CHEST 1 VIEW COMPARISON:  08/05/2006 chest radiograph FINDINGS: Stable normal cardiac silhouette given projection and technique. Hazy opacification of lungs, reticular markings, and peripheral linear opacities compatible alveolar pulmonary edema. Small bilateral pleural effusions and atelectasis. IMPRESSION: Alveolar pulmonary edema, small bilateral pleural effusions, minor bibasilar atelectasis. Electronically Signed   By: Kristine Garbe M.D.   On: 07/22/2017 16:28    Procedures Procedures (including critical care time)  Medications Ordered in ED Medications - No data to display   Initial Impression / Assessment and Plan / ED Course  I have reviewed the triage vital signs and the nursing notes.  Pertinent labs & imaging results that were available during my care of the patient were reviewed by me and considered in my medical decision making (see chart for details).     Patient presents with shortness of breath.  Has been going over the last month.  Appears to be in CHF.  No history of same.  Does have some worsening of his renal function 2.  Also worsening of his anemia.  States he has had some black stool at times but still no rectal exam done yet.  Still has chronic CLL also.   Rather dyspneic but not hypoxic.  Will admit to  family practice for further evaluation and treatment.  Final Clinical Impressions(s) / ED Diagnoses   Final diagnoses:  Congestive heart failure, unspecified HF chronicity, unspecified heart failure type ([place])  Renal insufficiency  Anemia, unspecified type  CLL (chronic lymphocytic leukemia) [facility])    ED Discharge Orders    None       Davonna Belling, MD 07/22/17 1711

## 2017-07-22 NOTE — ED Triage Notes (Signed)
Pt to ED  from [place] office by [facility] with increased SOB and pedal edema. PCP confirmed CHF and possible renal failure given at this visit per EMS. Pt currently SOB with talking and on exertion. O2 sat 97% on room air. Pt endorses intermittent abdominal pain but denies CP, N/V. Pt in NAD at this time.

## 2017-07-22 NOTE — H&P (Signed)
Date: 07/22/2017               Patient Name:  Nathan Richards MRN: [id number]  DOB: 1946-04-12 Age / Sex: 71 y.o., male   PCP: Nathan Pollen, MD              Medical Service: Internal Medicine Teaching Service              Attending Physician: Dr. Lucious Groves, DO    First Contact: Nathan Richards, MS3 Pager: [phone]  Second Contact: Dr. Maricela Richards Pager: [phone]  Third Contact Dr. Tiburcio Richards Pager: [phone]       After Hours (After 5p/  First Contact Pager: [phone]  weekends / holidays): Second Contact Pager: [phone]   Chief Complaint: SOB  History of Present Illness: [name] is a 71yo with a PMH significant for untreated CLL stage IV, CAD s/p PCI 2005, HTN, CKD, who presents from his PCP office with one month of worsening SOB. Pt reports that symptoms have been progressing over the course of this month and have acutely worsened the past 2 weeks that he had to move in with Nathan-Richards for help with care. Endorses 2-3 pillow orthopnea, PND, DOE and dyspnea at baseline and when talking, and LE edema. Patient has not taken anything for his symptoms. Per PCP, concern for new CHF diagnosis.   He is also complaining of epigastric pain that is worse after eating and improved with defecation. He has had a decreased appetite for some time now and reports approx 40lb weight loss within the past 2 months. Reports having soft BMs with a dark brown color, denied having black stools. Also having infraumbilical pain, but still making urine. Patient has an intermittent dry cough that is triggered by cold weather, but denies sputum production or hemoptysis. Pt denies a history of lung disease, but does report smoking 1/2-1ppd for many years, quit ~30 years ago.   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 Pt was later evaluated by Dr. Marin Richards in August for a bone marrow biopsy and flow cytometry which confirmed his diagnosis of CLL. Patient does not wish to seek therapy at this time. Of note, while patient mentioned a more recent diagnosis of cancer, reviewed clinic notes from 2009 also comment on this diagnosis and encouraged follow up with hematologist.  Nathan Richards is in the room and very helpful with providing history. She reports that patient has never believed in taking medications and has not been doing so for any of his medical problems. She comments on the fact that he has recently had profound anemia, worsening kidney disease, and a yellowing of his skin color.  In the ED, patient was not hemodynamically stable with significant  BP elevation with max of 190/102 and tachypneac to 20-30s. CXR notable for cardiomegaly, bilateral pulmonary effusions, and mild pulmonary edema, BNP 3324.5, and troponin negative 0.04. CMP, CBC ordered. Patient was started on lasix.  Meds: Current Facility-Administered Medications  Medication Dose Route Frequency Provider Last Rate Last Dose  . heparin injection 5,000 Units  5,000 Units Subcutaneous Q8H Nathan Richards, Vishal, MD      . sodium chloride flush (NS) 0.9 % injection 3 mL  3 mL Intravenous Q12H  Nathan Finders, MD       Current Outpatient Medications  Medication Sig Dispense Refill  . amLODipine (NORVASC) 5 MG tablet Take 1 tablet (5 mg total) by mouth daily. 90 tablet 3  . morphine (MSIR) 15 MG tablet Take 0.5 tablets (7.5 mg total) by mouth every 4 (four) hours as needed for severe pain. (Patient not taking: Reported on 07/22/2017) 5 tablet 0    Allergies: Allergies as of 07/22/2017 - Review Complete 07/22/2017  Allergen Reaction Noted  . Ace inhibitors  10/29/2006  . Amlodipine besylate  12/14/2006  . Beta adrenergic blockers  12/14/2006  . Hydrochlorothiazide Other (See Comments) 12/25/2010     Past Medical History:  Diagnosis Date  . Chronic kidney disease   . History of kidney stones   . Hypertension   . Urolithiasis    cystoscopy in 2002   Past Surgical History:  Procedure Laterality Date  . CORONARY ANGIOPLASTY WITH STENT PLACEMENT  02/05/2004   drug eluting to LAD plus baloon 1st diagonal  . INGUINAL HERNIA REPAIR     bilateral  . KNEE ARTHROSCOPY     right, done in [place]  . UMBILICAL HERNIA REPAIR     Family History  Problem Relation Age of Onset  . Diabetes Mother   . Coronary artery disease Mother   . Coronary artery disease Father        died  . Diabetes Father   . Diabetes Brother   . Diabetes Brother        died   Social History   Socioeconomic History  . Marital status: Married    Spouse name: Not on file  . Number of children: Not on file  . Years of education: Not on file  . Highest education level: Not on file  Social Needs  . Financial resource strain: Not on file  . Food insecurity - worry: Not on file  . Food insecurity - inability: Not on file  . Transportation needs - medical: Not on file  . Transportation needs - non-medical: Not on file  Occupational History    Employer: UNEMPLOYED  Tobacco Use  . Smoking status: Former Smoker    Years: 31.00    Types: Cigarettes    Last attempt to quit: 12/22/1997    Years since quitting: 19.5  . Smokeless tobacco: Never Used  Substance and Sexual Activity  . Alcohol use: No  . Drug use: No    Comment: remote use of MJ and cocaine  . Sexual activity: No  Other Topics Concern  . Not on file  Social History Narrative   Born [place], lived in [place]. For years before moving to [place]   Married, currently going through a divorce. Richards is in [place] alone    He has a son living here from a prior relationship   He and his current Richards have a daughter and son together   Another son was killed at age 84   2 grand daughters in [place], [place], [place] Nobbe lives locally    Disabled by psychiatric disease.    [employer]    Review of Systems: Pertinent items noted in HPI and remainder of comprehensive ROS otherwise negative.  Physical Exam: Blood pressure (!) 186/106, pulse 87, temperature 97.7 F (36.5 C), temperature source Oral, resp. rate 19, height 5' 2.5" (1.588 m), SpO2 100 %. General appearance: alert, appears older than stated age, cachectic, mild distress and pale and mild  jaundice Head: Normocephalic, without obvious abnormality, atraumatic Eyes: mild scleral icterus, PERRL Throat: mild mucosal jaundice, no thrush Neck: L anterior cervical adenopathy, JVP to jawline Lungs: bibasilar crackles, increased work of breathing, pausing during conversation Heart: regular rate and rhythm, no murmurs/rubs/gallops Abdomen: soft, tender to palpation of epigastric area and RUQ Extremities: bilateral 2+ edema, warm well perfused Skin: mild, diffuse jaundice  Lab results: '@LABTEST'$ @  Imaging results:  Dg Chest 2 View  Result Date: 07/22/2017 CLINICAL DATA:  Short of breath EXAM: CHEST  2 VIEW COMPARISON:  None. FINDINGS: COPD with pulmonary hyperinflation. Cardiac enlargement with heart failure. Interstitial edema and small bilateral effusions. Mild bibasilar atelectasis. IMPRESSION: COPD. Congestive heart failure with mild edema and bilateral effusions. Electronically Signed   By: Franchot Gallo M.D.   On: 07/22/2017 14:06   Dg Chest Port 1 View  Result Date: 07/22/2017 CLINICAL DATA:  71 y/o  M; shortness of breath. EXAM: PORTABLE CHEST 1 VIEW COMPARISON:  08/05/2006 chest radiograph FINDINGS: Stable normal cardiac silhouette given projection and technique. Hazy opacification of lungs, reticular markings, and peripheral linear opacities compatible alveolar pulmonary edema. Small bilateral pleural effusions and atelectasis. IMPRESSION: Alveolar pulmonary edema, small bilateral pleural effusions, minor bibasilar atelectasis. Electronically  Signed   By: Kristine Garbe M.D.   On: 07/22/2017 16:28    Other results: EKG: LVH, no signs of ischemia  Assessment & Plan by Problem: Active Problems:   Dyspnea  Dyspnea  Patient complaining of 1 month of worsening SOB, physical exam showing signs of volume overload supported by CXR and elevated BNP, presentation consistent with a probable new diagnosis of CHF likely 2/2 CAD and uncontrolled, longstanding HTN. Plan for optimal volume status and BP control. Consult cards in AM, recs appreciated. - plan for echo 11/29  - repeat EKG in am - follow troponin, concern for progression to demand ischemia - start amlodipine '5mg'$  qd - start IV lasix '40mg'$  BID - telemetry - daily weights - strict I/Os  CKD Patient baseline Cr (3.3), on admission elevated Cr 3.65, likely 2/2 to CHF exacerbation leading to volume overload and poor kidney perfusion. Plan for diuresis and BP control as above with expected return to baseline, will continue to follow Cr. Patient is aware of the possibility of requiring hemodialysis, will discuss this with patient during this admission.  - daily BMP - avoid nephrotoxic meds  HTN Longstanding history of untreated hypertension for which patient has not desired medical treatment. Likely contributing to heart failure presentation. Plan for optimal BP control, meds as above.  - Hydralazine '10mg'$  q8h if SBP >180 or DBP > 100  CLL Patient with a known diagnosis of CLL, likely since 2009, not desiring treatment. On admission patient had an elevated WBC of 24.9, Hgb 7.7, Hct 22.9, and platelets of 131. Patient is afebrile and will not begin antibiotics at this time. Will follow up with heme/onc Dr. Marin Richards. Consult palliative care for discussion of goals of care.  FEN GI - replete electrolytes PRN - diet: fluid consistency  This is a Careers information officer Note.  The care of the patient was discussed with Dr. Maricela Richards and the assessment and plan was formulated with their  assistance.  Please see their note for official documentation of the patient encounter.   Signed: Marilynne Richards, Medical Student 07/22/2017, 6:30 PM

## 2017-07-22 NOTE — Progress Notes (Signed)
Nathan Richards 71 y.o.   Chief Complaint  Patient presents with  . Shortness of Breath    per patient last month    HISTORY OF PRESENT ILLNESS: This is a 71 y.o. male complaining of SOB progressively getting worse over the past 2 weeks.  Shortness of Breath  This is a new problem. The current episode started 1 to 4 weeks ago. The problem occurs intermittently. The problem has been gradually worsening. Associated symptoms include abdominal pain (post-prandial), leg swelling, orthopnea and PND. Pertinent negatives include no chest pain, fever, headaches, hemoptysis, leg pain, neck pain, rash, sore throat, sputum production, syncope, vomiting or wheezing. The symptoms are aggravated by lying flat. He has tried nothing for the symptoms. There is no history of asthma, CAD, COPD, DVT, a heart failure, PE or a recent surgery.     Prior to Admission medications   Medication Sig Start Date End Date Taking? Authorizing Provider  amLODipine (NORVASC) 5 MG tablet Take 1 tablet (5 mg total) by mouth daily. Patient not taking: Reported on 07/22/2017 06/24/17   Horald Pollen, MD  morphine (MSIR) 15 MG tablet Take 0.5 tablets (7.5 mg total) by mouth every 4 (four) hours as needed for severe pain. Patient not taking: Reported on 07/22/2017 06/20/17   Deno Etienne, DO    Allergies  Allergen Reactions  . Ace Inhibitors     REACTION: cough  . Amlodipine Besylate     REACTION: Impotence  . Beta Adrenergic Blockers     REACTION: Impotence  . Hydrochlorothiazide Other (See Comments)    May have contributed to his hypercalcemia    Patient Active Problem List   Diagnosis Date Noted  . Chronic anemia 06/24/2017  . Itching 06/24/2017  . CLL (chronic lymphocytic leukemia) ([place]) 01/28/2017  . Chronic renal failure 01/28/2017  . Nocturia 03/21/2014  . Bilateral leg pain 02/22/2014  . Eustachian tube dysfunction 10/21/2013  . Cough 10/04/2013  . Vision changes 09/27/2013  . Noncompliance with  medications 09/27/2013  . Acquired blepharoptosis 04/19/2013  . Gastritis and gastroduodenitis 03/14/2013  . CHRONIC KIDNEY DISEASE STAGE III (MODERATE) 02/11/2010  . Other ganglion and cyst of synovium, tendon, and bursa(727.49) 02/08/2010  . 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 CLL 07/26/2007  . ERECTILE DYSFUNCTION, SECONDARY TO MEDICATION 11/16/2006  . HYPERLIPIDEMIA 10/22/2006  . SCHIZOPHRENIA 10/22/2006  . DEPRESSION, MAJOR, RECURRENT 10/22/2006  . HEARING LOSS NOS OR DEAFNESS 10/22/2006  . Essential hypertension 10/22/2006  . ASCVD 10/22/2006  . OSTEOARTHRITIS, MULTI SITES 10/22/2006    Past Medical History:  Diagnosis Date  . Chronic kidney disease   . History of kidney stones   . Hypertension   . Urolithiasis    cystoscopy in 2002    Past Surgical History:  Procedure Laterality Date  . CORONARY ANGIOPLASTY WITH STENT PLACEMENT  02/05/2004   drug eluting to LAD plus baloon 1st diagonal  . INGUINAL HERNIA REPAIR     bilateral  . KNEE ARTHROSCOPY     right, done in [place]  . UMBILICAL HERNIA REPAIR      Social History   Socioeconomic History  . Marital status: Married    Spouse name: Not on file  . Number of children: Not on file  . Years of education: Not on file  . Highest education level: Not on file  Social Needs  . Financial resource strain: Not on file  . Food insecurity - worry: Not on file  . Food insecurity - inability: Not on file  .  Transportation needs - medical: Not on file  . Transportation needs - non-medical: Not on file  Occupational History    Employer: UNEMPLOYED  Tobacco Use  . Smoking status: Former Smoker    Years: 31.00    Types: Cigarettes    Last attempt to quit: 12/22/1997    Years since quitting: 19.5  . Smokeless tobacco: Never Used  Substance and Sexual Activity  . Alcohol use: No  . Drug use: No    Comment: remote use of MJ and cocaine  . Sexual activity: No  Other Topics Concern  . Not on file  Social History  Narrative   Born [place], lived in [place]. For years before moving to [place]   Married, currently going through a divorce. Wife is in [place] alone    He has a son living here from a prior relationship   He and his current wife have a daughter and son together   Another son was killed at age 30   2 grand daughters in [place], [place], [place] Casto lives locally   Disabled by psychiatric disease.    [employer]    Family History  Problem Relation Age of Onset  . Diabetes Mother   . Coronary artery disease Mother   . Coronary artery disease Father        died  . Diabetes Father   . Diabetes Brother   . Diabetes Brother        died     Review of Systems  Constitutional: Positive for malaise/fatigue. Negative for chills and fever.  HENT: Negative.  Negative for nosebleeds and sore throat.   Eyes: Negative.  Negative for blurred vision and double vision.  Respiratory: Positive for shortness of breath. Negative for cough, hemoptysis, sputum production and wheezing.   Cardiovascular: Positive for orthopnea, leg swelling and PND. Negative for chest pain, palpitations and syncope.  Gastrointestinal: Positive for abdominal pain (post-prandial). Negative for blood in stool, diarrhea, nausea and vomiting.  Genitourinary: Negative for dysuria and hematuria.  Musculoskeletal: Negative for back pain, myalgias and neck pain.  Skin: Negative for rash.  Neurological: Positive for weakness. Negative for dizziness and headaches.  Endo/Heme/Allergies: Negative.   All other systems reviewed and are negative.     Vitals:   07/22/17 1327 07/22/17 1330  BP: (!) 160/84 138/88  Pulse: 87   Resp: 16   Temp: 97.8 F (36.6 C)   SpO2: 96%     Physical Exam  Constitutional: He appears well-developed. He has a sickly appearance. He appears ill. He appears distressed (Dyspneic when talking).  HENT:  Head: Normocephalic and atraumatic.  Mouth/Throat: Oropharynx is  clear and moist.  Eyes: EOM are normal. Pupils are equal, round, and reactive to light.  Pale conjunctivae  Neck: JVD present.  Cardiovascular: Normal rate, regular rhythm and normal heart sounds.  Pulmonary/Chest: Effort normal. He has rales (both bases).  Musculoskeletal: He exhibits edema (+2 pitting).  Skin: He is not diaphoretic. There is pallor.   Results for orders placed or performed in visit on 07/22/17 (from the past 24 hour(s))  POCT CBC     Status: Abnormal   Collection Time: 07/22/17  2:08 PM  Result Value Ref Range   WBC 29.8 (A) 4.6 - 10.2 K/uL   Lymph, poc 25.3 (A) 0.6 - 3.4   POC LYMPH PERCENT 84.8 (A) 10 - 50 %L   MID (cbc) 0.8 0 - 0.9   POC MID %  2.7 0 - 12 %M   POC Granulocyte 3.7 2 - 6.9   Granulocyte percent 12.5 (A) 37 - 80 %G   RBC 2.39 (A) 4.69 - 6.13 M/uL   Hemoglobin 7.5 (A) 14.1 - 18.1 g/dL   HCT, POC 23.2 (A) 43.5 - 53.7 %   MCV 97.0 80 - 97 fL   MCH, POC 31.5 (A) 27 - 31.2 pg   MCHC 32.4 31.8 - 35.4 g/dL   RDW, POC 15.4 %   Platelet Count, POC 168 142 - 424 K/uL   MPV 5.8 0 - 99.8 fL   Dg Chest 2 View  Result Date: 07/22/2017 CLINICAL DATA:  Short of breath EXAM: CHEST  2 VIEW COMPARISON:  None. FINDINGS: COPD with pulmonary hyperinflation. Cardiac enlargement with heart failure. Interstitial edema and small bilateral effusions. Mild bibasilar atelectasis. IMPRESSION: COPD. Congestive heart failure with mild edema and bilateral effusions. Electronically Signed   By: Franchot Gallo M.D.   On: 07/22/2017 14:06   Pertinent labs & imaging results that were available during my care of the patient were reviewed by me and considered in my medical decision making (see chart for details).   ASSESSMENT & PLAN:  Nathan Richards was seen today for shortness of breath.  Diagnoses and all orders for this visit:  Shortness of breath -     POCT CBC -     Comprehensive metabolic panel -     Brain natriuretic peptide -     DG Chest 2 View; Future  CLL (chronic  lymphocytic leukemia) (HCC)  Chronic anemia  Chronic renal failure, stage 3 (moderate) (HCC)  Acute congestive heart failure, unspecified heart failure type [facility])   To ED for further evaluation, treatment, and admission. Case d/w with EMS personnel.  Agustina Caroli, MD [facility]

## 2017-07-22 NOTE — H&P (Signed)
Date: 07/22/2017               Patient Name:  Nathan Richards MRN: [id number]  DOB: 1946/06/18 Age / Sex: 71 y.o., male   PCP: Horald Pollen, MD         Medical Service: Internal Medicine Teaching Service         Attending Physician: Dr. Lucious Groves, DO    First Contact: Dr. Maricela Bo Pager: [phone]  Second Contact: Dr. Tiburcio Pea Pager: [phone]       After Hours (After 5p/  First Contact Pager: [phone]  weekends / holidays): Second Contact Pager: [phone]   Chief Complaint: Shortness of breath  History of Present Illness: Nathan Richards is a 71 y.o m with pmh of CLL stage IV, CKD, CAD s/p PCI in 2005, essential hypertension, and chronic anemia who presents to be examined for 1 month history of shortness of breath. The shortness of breath is present at both rest and with exertion. However, the patient states that he gets short of breath when he talks and when he walks up stair. He states that he uses 2-3 pillows nightly to sleep. The patient has a 6 month history of epigastric pain that is present in the umbilical area, is burning in nature, non-radiating, worsened when he eats food, alleviated when he defecates, and the patient has not taken any medication for it. Two weeks ago the patient noticed that he has been having bilateral lower extremity swelling. The patient has been eating less but is hungry. He has lost 40lbs in the past 2 months. The patient states that he has had soft stools that have been dark brown for the past 1-1.5 weeks.   The patient reports a vague history of physicians and medical professionals informing him that he has CLL 10 years ago. 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 A bone marrow biopsy and flow cytometry done by oncology (Dr. Marin Olp) confirmed the diagnosis. His lamda light chain was 10.4 mg/dl, igG was 1175 mg/dl, and m spike of 0.5 g/dl. The patient last saw Dr. Marin Olp  planned to treat the patient with rituxan, cytoxan, and vincristine and he explained the risks and benefits of this, but the patient stated that he has to think about the treatment plan. The patient mentioned to team that he does not wish to undergo chemotherapy.   ED Course: Chest x-ray, BNP, CMP, CBC ordered The patient was given lasix  Meds:  Current Meds  Medication Sig  . amLODipine (NORVASC) 5 MG tablet Take 1 tablet (5 mg total) by mouth daily.     Allergies: Allergies as of 07/22/2017 - Review Complete 07/22/2017  Allergen Reaction Noted  . Ace inhibitors  10/29/2006  . Amlodipine besylate  12/14/2006  . Beta adrenergic blockers  12/14/2006  . Hydrochlorothiazide Other (See Comments) 12/25/2010   Past Medical History:  Diagnosis Date  . Chronic kidney disease   . History of kidney stones   . Hypertension   . Urolithiasis    cystoscopy in 2002    Family History:   Family History  Problem Relation Age of Onset  . Diabetes Mother   . Coronary artery disease Mother   . Coronary artery disease Father        died  . Diabetes Father   . Diabetes Brother   . Diabetes Brother        died  Social History:   Social History   Socioeconomic History  . Marital status: Married    Spouse name: Not on file  . Number of children: Not on file  . Years of education: Not on file  . Highest education level: Not on file  Social Needs  . Financial resource strain: Not on file  . Food insecurity - worry: Not on file  . Food insecurity - inability: Not on file  . Transportation needs - medical: Not on file  . Transportation needs - non-medical: Not on file  Occupational History    Employer: UNEMPLOYED  Tobacco Use  . Smoking status: Former Smoker    Years: 31.00    Types: Cigarettes    Last attempt to quit: 12/22/1997    Years since quitting: 19.5  . Smokeless tobacco: Never Used  Substance and Sexual Activity  . Alcohol use: No  . Drug use: No    Comment:  remote use of MJ and cocaine  . Sexual activity: No  Other Topics Concern  . Not on file  Social History Narrative   Born [place], lived in [place]. For years before moving to [place]   Married, currently going through a divorce. Wife is in [place] alone    He has a son living here from a prior relationship   He and his current wife have a daughter and son together   Another son was killed at age 50   2 grand daughters in [place], [place], [place]ek lives locally   Disabled by psychiatric disease.    [employer]   Former smoker 0.5-1ppd quit 36 years ago Lives alone but 2 weeks ago he moved in with his ex-wife   Review of Systems: A complete ROS was negative except as per HPI.   Physical Exam: Blood pressure (!) 186/106, pulse 87, temperature 97.7 F (36.5 C), temperature source Oral, resp. rate 19, height 5' 2.5" (1.588 m), SpO2 100 %.  Physical Exam  Constitutional: He appears well-developed and well-nourished. No distress.  HENT:  Head: Normocephalic and atraumatic.  Yellow coloration in oral mucus membranes under mouth  Eyes:  Mild amount of icterus present in bilateral eyes  Cardiovascular: Normal rate, regular rhythm and normal heart sounds.  Jugular venous distension noted 4cm above the clavicle  Pulmonary/Chest: Effort normal and breath sounds normal. No stridor. No respiratory distress.  Abdominal: Soft. Bowel sounds are normal. He exhibits no distension. There is tenderness (epigastric, luq, umbilical pain).  No splenomegaly or hepatomegaly noted on palpation  Musculoskeletal: He exhibits edema (2+ pitting).  Neurological: He is alert.  Skin: Capillary refill takes 2 to 3 seconds. He is not diaphoretic.  No skin tenting noted   Psychiatric: He has a normal mood and affect. His behavior is normal. Judgment and thought content normal.   CBC    Component Value Date/Time   WBC 24.8 (H) 07/22/2017 1539   RBC 2.39 (L) 07/22/2017 1539     HGB 7.7 (L) 07/22/2017 1539   HGB 10.6 (L) 04/02/2017 1058   HGB 16.5 12/03/2007 1127   HCT 22.9 (L) 07/22/2017 1539   HCT 31.1 (L) 04/02/2017 1058   HCT 46.3 12/03/2007 1127   PLT 131 (L) 07/22/2017 1539   PLT 93 (L) 04/02/2017 1058   PLT 113 (L) 01/27/2017 1054   MCV 95.8 07/22/2017 1539   MCV 97.0 07/22/2017 1408   MCV 94 04/02/2017 1058   MCV 83.1 12/03/2007 1127  MCH 32.2 07/22/2017 1539   MCHC 33.6 07/22/2017 1539   RDW 15.6 (H) 07/22/2017 1539   RDW 13.9 04/02/2017 1058   RDW 13.1 12/03/2007 1127   LYMPHSABS 16.7 (H) 07/22/2017 1539   LYMPHSABS 23.8 (H) 04/02/2017 1058   LYMPHSABS 15.4 (H) 12/03/2007 1127   MONOABS 0.2 07/22/2017 1539   MONOABS 0.7 12/03/2007 1127   EOSABS 2.2 (H) 07/22/2017 1539   EOSABS 1.3 (H) 04/02/2017 1058   BASOSABS 0.0 07/22/2017 1539   BASOSABS 0.0 04/02/2017 1058   BASOSABS 0.2 (H) 12/03/2007 1127   BMP Latest Ref Rng & Units 07/22/2017 06/19/2017 04/02/2017  Glucose 65 - 99 mg/dL 118(H) 112(H) 129(H)  BUN 6 - 20 mg/dL 47(H) 48(H) 51(H)  Creatinine 0.61 - 1.24 mg/dL 3.65(H) 3.74(H) 3.4(HH)  BUN/Creat Ratio 10 - 24 - - -  Sodium 135 - 145 mmol/L 134(L) 136 139  Potassium 3.5 - 5.1 mmol/L 4.5 3.6 3.8  Chloride 101 - 111 mmol/L 108 107 111(H)  CO2 22 - 32 mmol/L 20(L) 21(L) 27  Calcium 8.9 - 10.3 mg/dL 9.0 8.6(L) 9.3   BNP=3300  EKG: T wave inversions noted in lateral leads (v4-v6 and avl)  CXR: Alveolar pulmonary edema, small bilateral pleural effusions, kerley B lines noted on chest x-ray  Assessment & Plan by Problem:  71 y.o m with pmh of CLL, COPD, CKD3 who presents with shortness of breath and a 40lb weight loss over past 4 months.  Acute CHF Dyspnea The patient presented with shortness of breath, bilateral pitting edema, crackles in his bibasilar lungs, chest x-ray showing pleural effusions, and a bnp=3300 which all indicate that the patient likely has heart failure   The patient's ekg shows some t wave inversions in the  lateral leads that appear to be chronic. I-stat troponin was 0.04. The patient is asymptomatic and based on ekg and troponin results thusfar it is unlikely that the patient has had an ischemic event that is provoking this episode of heart failure.The patient had a myocardial stress test done in November 2006 that did not show any st or t wave changes upon stressing. Troponins will be trended to ensure however.   The patient has known copd which can possibly be pulmonary hypertension and structural changes in the heart that can be contributing to his heart failure.   The patient also has hypertension which can be contributing to his CHF.  The patient has a high wells score of 4 (active cancer, entire leg swollen, pitting edema, calf swelling>3cm) and therefore has a high pretest probability of 17-53%. However, due to the patient's cancer and the patient being asymtomatic workup will not be pursued at this time.   -TTE ordered -IV lasix '40mg'$  bid -continuous cardiac monitoring -TED hose -monitor I/O -Trend troponin   Chronic Lymphocytic Leukemia The patient has not been started on any therapy for his CLL thus far. The patient has an elevated wbc=24.9 and is currently currently afebrile. However, the patient has decreased hemoglobin at 7.7, hematocrit of 22.9, and platelets at 131. Will not place the patient on any antibiotics currently.   Chronic Renal Failure The patient's creatinine on admission was 3.65 which appears to have chronically elevated since June 2018. The patients creatinine in 2015 was approximately 1.3-1.5.   -Will need to discuss the patient's thoughts about future dialysis options.   Dispo: Admit patient to Inpatient with expected length of stay greater than 2 midnights.  Signed: Lars Mage, MD Internal Medicine PGY1 Pager:[phone] 07/22/2017, 6:29 PM

## 2017-07-22 NOTE — ED Notes (Signed)
Pt a little sob while talking with this RN. Kidron @ 2l applied per pt request. O2 sat 99% on RA.

## 2017-07-22 NOTE — Patient Instructions (Signed)
     IF you received an x-ray today, you will receive an invoice from [facility]. Please contact  Radiology at [phone] with questions or concerns regarding your invoice.   IF you received labwork today, you will receive an invoice from [facility]. Please contact [facility] at [phone] with questions or concerns regarding your invoice.   Our billing staff will not be able to assist you with questions regarding bills from these companies.  You will be contacted with the lab results as soon as they are available. The fastest way to get your results is to activate your My Chart account. Instructions are located on the last page of this paperwork. If you have not heard from us regarding the results in 2 weeks, please contact this office.     

## 2017-07-23 ENCOUNTER — Other Ambulatory Visit: Payer: Self-pay

## 2017-07-23 ENCOUNTER — Inpatient Hospital Stay (HOSPITAL_COMMUNITY): Payer: Medicare Other

## 2017-07-23 ENCOUNTER — Encounter (HOSPITAL_COMMUNITY): Payer: Self-pay | Admitting: General Practice

## 2017-07-23 DIAGNOSIS — I110 Hypertensive heart disease with heart failure: Secondary | ICD-10-CM | POA: Diagnosis not present

## 2017-07-23 DIAGNOSIS — I5041 Acute combined systolic (congestive) and diastolic (congestive) heart failure: Secondary | ICD-10-CM | POA: Diagnosis not present

## 2017-07-23 DIAGNOSIS — I130 Hypertensive heart and chronic kidney disease with heart failure and stage 1 through stage 4 chronic kidney disease, or unspecified chronic kidney disease: Secondary | ICD-10-CM | POA: Diagnosis not present

## 2017-07-23 LAB — COMPREHENSIVE METABOLIC PANEL
A/G RATIO: 1.2 (ref 1.2–2.2)
ALK PHOS: 115 IU/L (ref 39–117)
ALK PHOS: 109 U/L (ref 38–126)
ALT: 7 IU/L (ref 0–44)
ALT: 11 U/L — AB (ref 17–63)
AST: 13 IU/L (ref 0–40)
AST: 17 U/L (ref 15–41)
Albumin: 3.5 g/dL — ABNORMAL LOW (ref 3.6–4.8)
Albumin: 2.9 g/dL — ABNORMAL LOW (ref 3.5–5.0)
Anion gap: 6 (ref 5–15)
BILIRUBIN TOTAL: 0.4 mg/dL (ref 0.0–1.2)
BILIRUBIN TOTAL: 0.4 mg/dL (ref 0.3–1.2)
BUN / CREAT RATIO: 13 (ref 10–24)
BUN: 48 mg/dL — AB (ref 8–27)
BUN: 48 mg/dL — AB (ref 6–20)
CALCIUM: 8.8 mg/dL — AB (ref 8.9–10.3)
CO2: 20 mmol/L (ref 20–29)
CO2: 22 mmol/L (ref 22–32)
CREATININE: 3.84 mg/dL — AB (ref 0.61–1.24)
Calcium: 9.2 mg/dL (ref 8.6–10.2)
Chloride: 106 mmol/L (ref 96–106)
Chloride: 108 mmol/L (ref 101–111)
Creatinine, Ser: 3.6 mg/dL (ref 0.76–1.27)
GFR calc non Af Amer: 16 mL/min/{1.73_m2} — ABNORMAL LOW (ref >59)
GFR, EST AFRICAN AMERICAN: 19 mL/min/{1.73_m2} — AB (ref >59)
GFR, EST AFRICAN AMERICAN: 17 mL/min — AB (ref >60)
GFR, EST NON AFRICAN AMERICAN: 15 mL/min — AB (ref >60)
GLUCOSE: 130 mg/dL — AB (ref 65–99)
Globulin, Total: 3 g/dL (ref 1.5–4.5)
Glucose, Bld: 152 mg/dL — ABNORMAL HIGH (ref 65–99)
Potassium: 4.6 mmol/L (ref 3.5–5.2)
Potassium: 4.3 mmol/L (ref 3.5–5.1)
SODIUM: 139 mmol/L (ref 134–144)
Sodium: 136 mmol/L (ref 135–145)
TOTAL PROTEIN: 6.5 g/dL (ref 6.0–8.5)
Total Protein: 6.3 g/dL — ABNORMAL LOW (ref 6.5–8.1)

## 2017-07-23 LAB — CBC
HCT: 19.8 % — ABNORMAL LOW (ref 39.0–52.0)
Hemoglobin: 6.7 g/dL — CL (ref 13.0–17.0)
MCH: 32.1 pg (ref 26.0–34.0)
MCHC: 33.8 g/dL (ref 30.0–36.0)
MCV: 94.7 fL (ref 78.0–100.0)
PLATELETS: 107 10*3/uL — AB (ref 150–400)
RBC: 2.09 MIL/uL — ABNORMAL LOW (ref 4.22–5.81)
RDW: 15.3 % (ref 11.5–15.5)
WBC: 18.5 10*3/uL — AB (ref 4.0–10.5)

## 2017-07-23 LAB — SAVE SMEAR

## 2017-07-23 LAB — ABO/RH: ABO/RH(D): O POS

## 2017-07-23 LAB — LACTATE DEHYDROGENASE: LDH: 144 U/L (ref 98–192)

## 2017-07-23 LAB — TROPONIN I
Troponin I: 0.04 ng/mL (ref <0.03)
Troponin I: 0.04 ng/mL (ref <0.03)

## 2017-07-23 LAB — PREPARE RBC (CROSSMATCH)

## 2017-07-23 LAB — BRAIN NATRIURETIC PEPTIDE: BNP: 3658.8 pg/mL — ABNORMAL HIGH (ref 0.0–100.0)

## 2017-07-23 MED ORDER — ENSURE ENLIVE PO LIQD
237.0000 mL | Freq: Two times a day (BID) | ORAL | Status: DC
  Administered 2017-07-24: 237 mL via ORAL

## 2017-07-23 MED ORDER — SODIUM CHLORIDE 0.9 % IV SOLN: Freq: Once | INTRAVENOUS | Status: DC

## 2017-07-23 NOTE — Progress Notes (Deleted)
   Subjective: Nathan Richards was seen resting in his bed this morning. He stated that he was doing well and he denied any shortness of breath, chest pain, nausea or vomiting.   Objective:  Vital signs in last 24 hours: Vitals:   07/23/17 0800 07/23/17 0815 07/23/17 0845 07/23/17 0930  BP: (!) 168/93 (!) 163/90 (!) 163/97 (!) 138/118  Pulse: 85 81 89   Resp: (!) 21 (!) 27 (!) 26   Temp:      TempSrc:      SpO2: 100% 100% 100%   Height:       Physical Exam  Constitutional: He appears well-developed and well-nourished. He appears cachectic. No distress.  HENT:  Head: Normocephalic and atraumatic.  Eyes: Conjunctivae are normal.  Cardiovascular: Normal rate, regular rhythm, normal heart sounds and intact distal pulses.  Respiratory: Effort normal and breath sounds normal. No respiratory distress. He has no wheezes.  GI: Soft. Bowel sounds are normal. He exhibits no distension. There is no tenderness.  Musculoskeletal: Normal range of motion.  Neurological: He is alert.  Skin: He is not diaphoretic.  Psychiatric: He has a normal mood and affect. His behavior is normal. Judgment and thought content normal.     Assessment/Plan:   Acute CHF Dyspnea Patient has acute congestive heart failure that is thought to be due to  poorly controlled hypertension and possibly pulmonary hypertension.  TTE will be done today 07/23/2017 to determine the severity of the heart failure. The patient's I/O's have not been documented.   The patient's troponin has reached plateau of 0.04.  -TTE ordered -IV lasix $RemoveB'40mg'QWeDEXtN$  bid -continuous cardiac monitoring -TED hose  Chronic Lymphocytic Leukemia The team spoke to patient regarding his diagnosis today and the treatment options available to him. The patient was aggreeable to following up with oncology outpatient.   -consulted care management for home health services  Chronic Renal Failure The patient's creatinine on admission was 3.65 which appears to have  chronically elevated since June 2018. The patients creatinine in 2015 was approximately 1.3-1.5. The patient states that he did not pursue treatment for his renal failure to this point because he has linked nephrology with his CLL diagnosis and has not felt like going back to nephrology.  Normocytic Anemia  The patient had a mcv of 97, hb=7.5, hct=23.2. The patient's anemia is likely due to his CLL. However, will evaluate for hemolytic anemia with blood smear and ldh.  The patient's hb dropped to 6.7 today 07/23/17. Transfused 1 unit prbc -monitor post transfusion hb  DVT PPX: heparin  Dispo: Anticipated discharge in approximately 0-1 days.   Lars Mage, MD Internal Medicine PGY1 Pager:[phone] 07/23/2017, 1:47 PM

## 2017-07-23 NOTE — ED Notes (Signed)
Date and time results received: 07/23/17 1139 (use smartphrase ".now" to insert current time)  Test: troponin Critical Value: 0.04  Name of Provider Notified: chelsea, RN made aware, she will contact admitting physician  Orders Received? Or Actions Taken?:

## 2017-07-23 NOTE — Care Management Obs Status (Signed)
[place] NOTIFICATION   Patient Details  Name: Alfonzo Arca MRN: [id number] Date of Birth: [birth year]   Medicare Observation Status Notification Given:  Yes    Pollie Friar, RN 07/23/2017, 2:41 PM

## 2017-07-23 NOTE — ED Notes (Signed)
Physician at bedside discussing blood transfusion with patient

## 2017-07-23 NOTE — ED Notes (Signed)
PT to be on strict I & O

## 2017-07-23 NOTE — ED Notes (Signed)
Lunch tray ordered, regular.

## 2017-07-23 NOTE — Care Management Note (Signed)
Case Management Note  Patient Details  Name: Nathan Richards MRN: [id number] Date of Birth: [birth year]  Subjective/Objective:      CM following for progression and d/c planning.               Action/Plan: 07/23/2017 Noted that pt lives with family, will follow for d/c needs. Also noted that pt has been changed to OBS status therefore would not qualify for Medicare to pay for short term rehab.  [facility] aware of referral for [facility] and that pt has been seen by [facility] rep, V Brewer.     Expected Discharge Date:                  Expected Discharge Plan:  [place]  In-House Referral:  NA  Discharge planning Services  CM Consult  Post Acute Care Choice:    Choice offered to:     DME Arranged:    DME Agency:     HH Arranged:    HH Agency:     Status of Service:  In process, will continue to follow  If discussed at Long Length of Stay Meetings, dates discussed:    Additional Comments:  Adron Bene, RN 07/23/2017, 4:25 PM

## 2017-07-23 NOTE — ED Notes (Signed)
Admit Doctors at bedside.

## 2017-07-23 NOTE — ED Notes (Signed)
Date and time results received: 07/23/17 1122 (use smartphrase ".now" to insert current time)  Test: hemoglobin Critical Value: 6.7  Name of Provider Notified: admitting physician paged by Vikki Ports, RN  Orders Received? Or Actions Taken?:

## 2017-07-23 NOTE — ED Notes (Signed)
Family at bedside. 

## 2017-07-23 NOTE — Care Management CC44 (Signed)
Condition Code 44 Documentation Completed  Patient Details  Name: Ayinde Swim MRN: [id number] Date of Birth: December 06, 1945   Condition Code 44 given:  Yes Patient signature on Condition Code 44 notice:  Yes Documentation of 2 MD's agreement:  Yes Code 44 added to claim:  Yes    Pollie Friar, RN 07/23/2017, 2:41 PM

## 2017-07-23 NOTE — Consult Note (Signed)
   [facility] CM Inpatient Consult   07/23/2017  Nathan Richards October 27, 1945 [id number]  Consult received for  [facility] [place] [employer] services. Patient is in the  [employer] network under patient's [employer]. Met with the patient at the bedside.  Patient states he is unsure of his discharge needs.  He states he is currently living with his son.  He states he usually drives himself to appointments but states, "I was at the doctor's and they told me I could not drive myself to the hospital so that's why I came by ambulance."  Patient denies issues with getting his medications he states he uses [facility] and he, "has Medicare and Medicaid so, it doesn't cost too much."   Explained to patient this Probation officer was contacted for his new medical issues.  He states that, "the doctors said they need to test some more, I'm not sure."  Patient is agreeable to post hospital follow up with [facility].  He states, "I speak and understand English pretty good if you talk slow but I don't read [place] but my daughter-in-law reads it good."  Patient accepted the brochure with contact information and states he will have his, "daughter-in-law read it and she can call if she has questions." Will follow for disposition needs.   For questions, please contact:   Natividad Brood, RN BSN [facility] Liaison  [phone] business mobile phone Toll free office [phone]

## 2017-07-23 NOTE — Progress Notes (Signed)
Pt admitted to room 5M09 from overflow.  Pt alert and oriented.  VSS.  Denies pain.  Pleasant.

## 2017-07-24 ENCOUNTER — Observation Stay (HOSPITAL_BASED_OUTPATIENT_CLINIC_OR_DEPARTMENT_OTHER): Payer: Medicare Other

## 2017-07-24 DIAGNOSIS — I130 Hypertensive heart and chronic kidney disease with heart failure and stage 1 through stage 4 chronic kidney disease, or unspecified chronic kidney disease: Secondary | ICD-10-CM | POA: Diagnosis not present

## 2017-07-24 DIAGNOSIS — I110 Hypertensive heart disease with heart failure: Secondary | ICD-10-CM | POA: Diagnosis not present

## 2017-07-24 DIAGNOSIS — I351 Nonrheumatic aortic (valve) insufficiency: Secondary | ICD-10-CM | POA: Diagnosis not present

## 2017-07-24 DIAGNOSIS — I5041 Acute combined systolic (congestive) and diastolic (congestive) heart failure: Secondary | ICD-10-CM | POA: Diagnosis not present

## 2017-07-24 LAB — ECHOCARDIOGRAM COMPLETE
HEIGHTINCHES: 62.5 in
Weight: 1823.65 oz

## 2017-07-24 LAB — CBC
HCT: 23.7 % — ABNORMAL LOW (ref 39.0–52.0)
HEMOGLOBIN: 7.9 g/dL — AB (ref 13.0–17.0)
MCH: 31.1 pg (ref 26.0–34.0)
MCHC: 33.3 g/dL (ref 30.0–36.0)
MCV: 93.3 fL (ref 78.0–100.0)
PLATELETS: 99 10*3/uL — AB (ref 150–400)
RBC: 2.54 MIL/uL — ABNORMAL LOW (ref 4.22–5.81)
RDW: 15.5 % (ref 11.5–15.5)
WBC: 17.6 10*3/uL — ABNORMAL HIGH (ref 4.0–10.5)

## 2017-07-24 LAB — TYPE AND SCREEN
ABO/RH(D): O POS
Antibody Screen: NEGATIVE
Unit division: 0

## 2017-07-24 LAB — HEMOGLOBIN AND HEMATOCRIT, BLOOD
HCT: 22.2 % — ABNORMAL LOW (ref 39.0–52.0)
Hemoglobin: 7.5 g/dL — ABNORMAL LOW (ref 13.0–17.0)

## 2017-07-24 LAB — BPAM RBC
Blood Product Expiration Date: 201812252359
ISSUE DATE / TIME: 201811291810
UNIT TYPE AND RH: 5100

## 2017-07-24 MED ORDER — ISOSORB DINITRATE-HYDRALAZINE 20-37.5 MG PO TABS: 1.0000 | ORAL_TABLET | Freq: Three times a day (TID) | ORAL | 2 refills | Status: AC

## 2017-07-24 MED ORDER — FUROSEMIDE 40 MG PO TABS: 60.0000 mg | ORAL_TABLET | Freq: Two times a day (BID) | ORAL | 2 refills | Status: DC

## 2017-07-24 NOTE — Care Management Note (Signed)
Case Management Note  Patient Details  Name: Yarden Hillis MRN: [id number] Date of Birth: March 16, 1946  Subjective/Objective:         CM following for progression and d/c planning.            Action/Plan: 07/24/2017  4:40pm continue to follow pt. Per notes attending plans to order [facility], however this CM spoke with Dr  Roxanne Mins   Who states that they will eval this pt again before entering orders . No HH needs identified, this pt independent prior to admission. Will arrange [facility] services on Mondayl Jul 27, 2017 if ordered.   Expected Discharge Date:  07/24/17               Expected Discharge Plan:  [place]  In-House Referral:  NA  Discharge planning Services  CM Consult  Post Acute Care Choice:    Choice offered to:     DME Arranged:    DME Agency:     HH Arranged:    HH Agency:     Status of Service:  In process, will continue to follow  If discussed at Long Length of Stay Meetings, dates discussed:    Additional Comments:  Adron Bene, RN 07/24/2017, 4:36 PM

## 2017-07-24 NOTE — Progress Notes (Signed)
**  Note was accidentally taken over by case manager [facility] and therefore has been re copied and pasted here**     Subjective: Nathan Richards was seen resting in his bed this morning. He stated that he was doing well and he denied any shortness of breath, chest pain, nausea or vomiting.   Objective:  Vital signs in last 24 hours:       Vitals:   07/23/17 0800 07/23/17 0815 07/23/17 0845 07/23/17 0930  BP: (!) 168/93 (!) 163/90 (!) 163/97 (!) 138/118  Pulse: 85 81 89   Resp: (!) 21 (!) 27 (!) 26   Temp:      TempSrc:      SpO2: 100% 100% 100%   Height:       Physical Exam  Constitutional: He appears well-developed and well-nourished. He appears cachectic. No distress.  HENT:  Head: Normocephalic and atraumatic.  Eyes: Conjunctivae are normal.  Cardiovascular: Normal rate, regular rhythm, normal heart sounds and intact distal pulses.  Respiratory: Effort normal and breath sounds normal. No respiratory distress. He has no wheezes.  GI: Soft. Bowel sounds are normal. He exhibits no distension. There is no tenderness.  Musculoskeletal: Normal range of motion.  Neurological: He is alert.  Skin: He is not diaphoretic.  Psychiatric: He has a normal mood and affect. His behavior is normal. Judgment and thought content normal.     Assessment/Plan:   Acute CHF Dyspnea Patient has acute congestive heart failure that is thought to be due to  poorly controlled hypertension and possibly pulmonary hypertension.  TTE will be done today 07/23/2017 to determine the severity of the heart failure. The patient's I/O's have not been documented.   The patient's troponin has reached plateau of 0.04.  -TTEordered -IV lasix40mg bid -continuous cardiac monitoring -TED hose  Chronic Lymphocytic Leukemia The team spoke to patient regarding his diagnosis today and the treatment options available to him. The patient was aggreeable to following up with oncology  outpatient.   -consulted care management for home health services  Chronic Renal Failure The patient's creatinine on admission was 3.65 which appears to have chronically elevated since June 2018. The patients creatinine in 2015 was approximately 1.3-1.5. The patient states that he did not pursue treatment for his renal failure to this point because he has linked nephrology with his CLL diagnosis and has not felt like going back to nephrology.  Normocytic Anemia  The patient had a mcv of 97, hb=7.5, hct=23.2. The patient's anemia is likely due to his CLL. However, will evaluate for hemolytic anemia with blood smear and ldh.  The patient's hb dropped to 6.7 today 07/23/17. Transfused 1 unit prbc -monitor post transfusion hb  DVT PPX: heparin  Dispo: Anticipated discharge in approximately 0-1 days.   Lars Mage, MD Internal Medicine PGY1 [phone] 07/23/2017, 1:47 PM

## 2017-07-24 NOTE — Progress Notes (Signed)
Initial Nutrition Assessment  DOCUMENTATION CODES:   Not applicable  INTERVENTION:   -Ensure Enlive po BID, each supplement provides 350 kcal and 20 grams of protein   NUTRITION DIAGNOSIS:   Inadequate oral intake related to chronic illness, poor appetite as evidenced by per patient/family report.  GOAL:   Patient will meet greater than or equal to 90% of their needs  MONITOR:   PO intake, Supplement acceptance, Labs, Weight trends  REASON FOR ASSESSMENT:   Malnutrition Screening Tool    ASSESSMENT:   71 yo male admitted with acute CHF. Pt with recent dx of CLL hx of CKD, HTN   11/30 TEE performed  Recorded po intake 25% at dinner last night, 100% at lunch today.   Per weight encounters, pt has lost weight.  Wt Readings from Last 10 Encounters:  07/23/17 113 lb 15.7 oz (51.7 kg)  07/22/17 120 lb 12.8 oz (54.8 kg)  06/24/17 125 lb 3.2 oz (56.8 kg)  06/19/17 118 lb (53.5 kg)  04/02/17 124 lb (56.2 kg)  03/25/17 125 lb (56.7 kg)  02/24/17 120 lb (54.4 kg)  01/28/17 121 lb 3.2 oz (55 kg)  01/27/17 120 lb (54.4 kg)  05/30/14 138 lb 4.8 oz (62.7 kg)    Labs: reviewed Meds: lasix  NUTRITION - FOCUSED PHYSICAL EXAM:  Unable to perform on visit today  Diet Order:  Diet regular Room service appropriate? Yes; Fluid consistency: Thin  EDUCATION NEEDS:   No education needs have been identified at this time  Skin:  Skin Assessment: Reviewed RN Assessment  Last BM:  11/29  Height:   Ht Readings from Last 1 Encounters:  07/22/17 5' 2.5" (1.588 m)    Weight:   Wt Readings from Last 1 Encounters:  07/23/17 113 lb 15.7 oz (51.7 kg)    Ideal Body Weight:     BMI:  Body mass index is 20.51 kg/m.  Estimated Nutritional Needs:   Kcal:  1450-1650 kcals  Protein:  73-83 g  Fluid:  >/= 1.5 L   Kerman Passey MS, RD, LDN, CNSC [phone] Pager  [phone] Weekend/On-Call Pager

## 2017-07-24 NOTE — Progress Notes (Signed)
   Subjective: Mr. Jablonsky was seen resting in his bed this morning. He stated th at he denied any lightheadedness, dizziness, vomiting,  Nausea, shortness of breath, chest pain, or blurry vision. The patient expressed that he feels like a prisoner with the bed alarms and would like to be discharged.   Objective:  Vital signs in last 24 hours: Vitals:   07/23/17 2155 07/24/17 0523 07/24/17 0800 07/24/17 0849  BP: (!) 163/102 (!) 169/87 (!) 165/91 (!) 170/90  Pulse: 82 79 84 80  Resp: $Remo'18 18 16 18  'BOync$ Temp: 98.6 F (37 C) 98.2 F (36.8 C) 98.1 F (36.7 C) 98.1 F (36.7 C)  TempSrc: Oral Oral Oral Oral  SpO2: 100% 100% 99% 100%  Weight: 113 lb 15.7 oz (51.7 kg)     Height:       Physical Exam  Constitutional: He appears well-developed and well-nourished. No distress.  HENT:  Head: Normocephalic and atraumatic.  Eyes: Conjunctivae are normal.  Neck: Normal range of motion.  Cardiovascular: Normal rate, regular rhythm, normal heart sounds and intact distal pulses.  Pulmonary/Chest: Effort normal and breath sounds normal. No stridor. No respiratory distress.  Abdominal: Soft. Bowel sounds are normal. He exhibits no distension. There is no tenderness.  Neurological: He is alert.  Skin: He is not diaphoretic.  Psychiatric: He has a normal mood and affect. His behavior is normal. Judgment and thought content normal.    Assessment/Plan:  Acute CHF Dyspnea Patient has acute congestive heart failure that is thought to be due to poorly controlled hypertension.   TTE was done today 07/24/17 but result has not been read yet The patient is -42mL total over the past 24 hrs. Outpatient CHF medication will be determined based on the official TTE read. Will start imdur and lasix $RemoveB'60mg'WMROkjjr$  bid outpatient.  -Continue IV lasix$RemoveBeforeDEI'40mg'RTtMYTJPVAfzZnog$ bid -continuous cardiac monitoring -TED hose  Chronic Lymphocytic Leukemia The patient should follow up with Dr. Marin Olp outpatient to discuss whether he would like to  pursue treatment options. The team explained the risks of not pursuing therapy and that he is more prone to infections and other medical issues.   -consultedcare managementfor home health services to assist with his adls at home as the patient states he has moved in with his ex-wife to help with his home activities.   Chronic Renal Failure The patient's creatinine on admission was 3.65 which appears to have chronically elevated since June 2018. The patients creatinine in 2015 was approximately 1.3-1.5.The patient states that he did not pursue treatment for his renal failure to this point because he has linked nephrology with his CLL diagnosis and has not felt like going back to nephrology.  Normocytic Anemia  The patient's hb dropped to 6.7 yesterday 07/23/17 and he was transfused 1 unit prbc. The post transfusion hb was 7.5 and hematocrit 22.2. This morning 07/24/17 the patient's hb is 7.9 and hematocrit 23.7.  -LDH=144 was checked to determine whether he has hemolytic anemia and it appears unlikely -Pathology Smear review pending  DVT PPX: heparin   Dispo: Anticipated discharge today.   Lars Mage, MD Internal Medicine PGY1 [phone] 07/24/2017, 12:20 PM

## 2017-07-24 NOTE — Progress Notes (Signed)
  Echocardiogram 2D Echocardiogram has been performed.  

## 2017-07-25 LAB — PATHOLOGIST SMEAR REVIEW

## 2017-07-26 NOTE — Discharge Summary (Signed)
Name: Nathan Richards MRN: [id number] DOB: 1946/04/17 71 y.o. PCP: Horald Pollen, MD  Date of Admission: 07/22/2017  3:29 PM Date of Discharge: 07/26/2017 Attending Physician: Dr. 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)  Active Problems:   Essential hypertension   CKD (chronic kidney disease) stage 4, GFR 15-29 ml/min (HCC)   Noncompliance with medications   CLL (chronic lymphocytic leukemia) ([place])   Discharge Medications: Allergies as of 07/24/2017      Reactions   Ace Inhibitors    REACTION: cough   Amlodipine Besylate    REACTION: Impotence   Beta Adrenergic Blockers    REACTION: Impotence   Hydrochlorothiazide Other (See Comments)   May have contributed to his hypercalcemia      Medication List    STOP taking these medications   amLODipine 5 MG tablet Commonly known as:  NORVASC   morphine 15 MG tablet Commonly known as:  MSIR     TAKE these medications   furosemide 40 MG tablet Commonly known as:  LASIX Take 1.5 tablets (60 mg total) by mouth 2 (two) times daily.   isosorbide-hydrALAZINE 20-37.5 MG tablet Commonly known as:  BIDIL Take 1 tablet by mouth 3 (three) times daily.       Disposition and follow-up:   Mr.Montrey Lehigh was discharged from [facility] in stable condition.  At the hospital follow up visit please address:  1.  Acute on chronic chf: check weight and volume status  CLL: make sure the patient follows up with Dr. Marin Olp.   2.  Labs / imaging needed at time of follow-up:cbc, bmp  3.  Pending labs/ test needing follow-up: none  Follow-up Appointments: Follow-up Information    Volanda Napoleon, MD. Go on 08/03/2017.   Specialty:  Oncology Why:  at 1:45pm Contact information: [place] [id number] [place] by problem list:  Acute  combined systolic (congestive) and diastolic (congestive) heart failure (HCC) The patient presented with shortness of breath, bilateral pitting edema, crackles in his bibasilar lungs, chest x-ray showing pleural effusions, and a bnp=[id number] which caused concern for heart failure. The patient was diuresed with IV lasix $Remove'40mg'PHeNrFy$  bid. The patient had an echo done which showed LVEF of 35-40%, diffuse hypokinesis, grade 2 diastolic dysfunction, left pleural effusion. The patient's weight on discharge was 113lbs and he was net +65 since admission.   Chronic Lymphocytic Leukemia The patient has not been started on any therapy for his CLL. The patient has an elevated wbc=24.9 and is currently currently afebrile. However, the patient has decreased hemoglobin at 7.7, hematocrit of 22.9, and platelets at 131. Long discussion was had with the patient with stress made that there are good treatment options for CLL. The patient expressed understanding and consented to follow up with Dr. Marin Olp.   Acute on chronic anemia: The patient presented with a hemoglobin of 7.7, hematocrit of 22.9, and platelets of 99. The patient's hemoglobin had dropped to 6.7. The patient was transfused one unit of prbcs and his hb=7.5 and hct=22 was post transfusion. The worsening of the patient's blood counts is thought to be due to his CLL. The patient's LDH was normal at 144 and his pathology smear read as lymphocytosis with smudge cells present.    Discharge Vitals:   BP (!) 174/80 (BP Location: Left Arm)  Pulse 90   Temp 97.8 F (36.6 C) (Oral)   Resp 18   Ht 5' 2.5" (1.588 m)   Wt 113 lb 15.7 oz (51.7 kg)   SpO2 100%   BMI 20.51 kg/m   Pertinent Labs, Studies, and Procedures:   CBC    Component Value Date/Time   WBC 17.6 (H) 07/24/2017 0910   RBC 2.54 (L) 07/24/2017 0910   HGB 7.9 (L) 07/24/2017 0910   HGB 10.6 (L) 04/02/2017 1058   HGB 16.5 12/03/2007 1127   HCT 23.7 (L) 07/24/2017 0910   HCT 31.1 (L) 04/02/2017 1058    HCT 46.3 12/03/2007 1127   PLT 99 (L) 07/24/2017 0910   PLT 93 (L) 04/02/2017 1058   PLT 113 (L) 01/27/2017 1054   MCV 93.3 07/24/2017 0910   MCV 97.0 07/22/2017 1408   MCV 94 04/02/2017 1058   MCV 83.1 12/03/2007 1127   MCH 31.1 07/24/2017 0910   MCHC 33.3 07/24/2017 0910   RDW 15.5 07/24/2017 0910   RDW 13.9 04/02/2017 1058   RDW 13.1 12/03/2007 1127   LYMPHSABS 16.7 (H) 07/22/2017 1539   LYMPHSABS 23.8 (H) 04/02/2017 1058   LYMPHSABS 15.4 (H) 12/03/2007 1127   MONOABS 0.2 07/22/2017 1539   MONOABS 0.7 12/03/2007 1127   EOSABS 2.2 (H) 07/22/2017 1539   EOSABS 1.3 (H) 04/02/2017 1058   BASOSABS 0.0 07/22/2017 1539   BASOSABS 0.0 04/02/2017 1058   BASOSABS 0.2 (H) 12/03/2007 1127   BMP Latest Ref Rng & Units 07/23/2017 07/22/2017 07/22/2017  Glucose 65 - 99 mg/dL 152(H) 118(H) 130(H)  BUN 6 - 20 mg/dL 48(H) 47(H) 48(H)  Creatinine 0.61 - 1.24 mg/dL 3.84(H) 3.65(H) 3.60(>)  BUN/Creat Ratio 10 - 24 - - 13  Sodium 135 - 145 mmol/L 136 134(L) 139  Potassium 3.5 - 5.1 mmol/L 4.3 4.5 4.6  Chloride 101 - 111 mmol/L 108 108 106  CO2 22 - 32 mmol/L 22 20(L) 20  Calcium 8.9 - 10.3 mg/dL 8.8(L) 9.0 9.2   BNP=3300  EKG: T wave inversions noted in lateral leads (v4-v6 and avl)  CXR: Alveolar pulmonary edema, small bilateral pleural effusions, kerley B lines noted on chest x-ray  Discharge Instructions: Discharge Instructions    (HEART FAILURE PATIENTS) Call MD:  Anytime you have any of the following symptoms: 1) 3 pound weight gain in 24 hours or 5 pounds in 1 week 2) shortness of breath, with or without a dry hacking cough 3) swelling in the hands, feet or stomach 4) if you have to sleep on extra pillows at night in order to breathe.   Complete by:  As directed    AMB Referral to [facility]   Complete by:  As directed    Reason for consult:  MD referral from hospital - new diagnoses CLL,   Expected date of contact:  1-3 days (reserved for hospital discharges)   Please  assign patient for Short term TOC in the [facility] [place].  Patient speaks [place] but does not read or write in [place].  He will be living with his son at  [place]. May need interpreter over the phone he says, "If you don't speak slowly."  Referral for post hospital follow up in the [employer].   Call MD for:  difficulty breathing, headache or visual disturbances   Complete by:  As directed    Call MD for:  extreme fatigue   Complete by:  As directed    Call  MD for:  hives   Complete by:  As directed    Call MD for:  persistant dizziness or light-headedness   Complete by:  As directed    Call MD for:  persistant nausea and vomiting   Complete by:  As directed    Call MD for:  redness, tenderness, or signs of infection (pain, swelling, redness, odor or green/yellow discharge around incision site)   Complete by:  As directed    Call MD for:  severe uncontrolled pain   Complete by:  As directed    Call MD for:  temperature >100.4   Complete by:  As directed    Diet - low sodium heart healthy   Complete by:  As directed    Discharge instructions   Complete by:  As directed    STOP taking amlodipine  START taking Bidil (isosorbide-hydralazine) one pill three times a day START taking Lasix (furosemide) one and a half pills, one in the morning, one in the afternoon  Please see your PCP at [facility] on Monday or Tuesday Please follow up with Dr. Marin Olp on Dec 10th at 1:45pm   Increase activity slowly   Complete by:  As directed       Signed: Lars Mage, MD Internal Medicine PGY1 Pager [phone]

## 2017-07-27 ENCOUNTER — Telehealth: Payer: Self-pay

## 2017-07-27 NOTE — Telephone Encounter (Signed)
Transition Care Management Follow-up Telephone Call   Date discharged? 07/24/17   How have you been since you were released from the hospital? Patient states that he is feeling much better. He has no concerns at this time.    Do you understand why you were in the hospital? yes   Do you understand the discharge instructions? yes   Where were you discharged to? home   Items Reviewed:  Medications reviewed: yes  Allergies reviewed: yes  Dietary changes reviewed: yes  Referrals reviewed: yes   Functional Questionnaire:   Activities of Daily Living (ADLs):   He states they are independent in the following: ambulation, bathing and hygiene, feeding, continence, grooming, toileting and dressing States they require assistance with the following: none   Any transportation issues/concerns?: no   Any patient concerns? no   Confirmed importance and date/time of follow-up visits scheduled yes  Provider Appointment booked with Dr. Mitchel Honour on 07/29/17 @ 10:00 am.  Confirmed with patient if condition begins to worsen call PCP or go to the ER.  Patient was given the office number and encouraged to call back with question or concerns.: yes

## 2017-07-28 ENCOUNTER — Other Ambulatory Visit: Payer: Self-pay | Admitting: *Deleted

## 2017-07-28 NOTE — Patient Outreach (Signed)
[facility]) Care Management  07/28/2017  Nathan Richards Sep 24, 1945 [id number]   Transition of Care Referral  Referral Date: 07/28/17 Referral Source: Nathan Richards [facility]) Date of Discharge: 07/26/17 Facility: [facility] Discharge Diagnosis: Heart Failure Insurance: UHC Medicare, Medicaid  Outreach attempt # 1 and HIPAA verified with patient. Patient has a past medical history of Chronic Lymphatic Leukemia, Chronic Anemia, Chronic Renal Insufficiency, HTN, Narcotic Abuse, HLD, Schizophrenia, depression, Osteoarthritis, and GERD. Patient was recently diagnosed with heart failure. He was discharged from the hospital without a referral to a Cardiologist. Patient spoke about having a scheduled visit with a "heart doctor" prior to being hospitalized. He was unable to remember the name of the Cardiologist. Patient's discharge paperwork was printed in [place]. He stated, he was unable to read and understand the paperwork. He is requesting a copy of the discharge instructions in Spanish. Patient rated his health as poor. He doesn't have a scale at home and he is not monitoring his weight. Patient voiced adhering to a low sodium diet and no sweets. He stated, he developed heart failure due to not taking his medications. Patient will need an interpreter. Patient is living with his son, currently. His ex-wife is staying with patient at his son's home to assist with patient's care. [facility] services and benefits explained to patient. Patient agreed to services.   Plan: RN CM advised patient to contact RNCM for any needs or concerns. RN CM will send referral to [facility] RN for further in home eval/assessment of care needs and management of chronic conditions. RN CM will send referral to [place] for transition of care.   Nathan Bells, RN, BSN, MHA/MSL, [place] Telephonic Care Manager Coordinator [facility] Direct Phone: [phone] Toll Free:  [phone] Fax: [phone]

## 2017-07-29 ENCOUNTER — Encounter: Payer: Self-pay | Admitting: Emergency Medicine

## 2017-07-29 ENCOUNTER — Ambulatory Visit (INDEPENDENT_AMBULATORY_CARE_PROVIDER_SITE_OTHER): Payer: Medicare Other | Admitting: Emergency Medicine

## 2017-07-29 VITALS — BP 142/72 | HR 74 | Temp 98.0°F | Resp 16 | Ht 62.5 in | Wt 115.2 lb

## 2017-07-29 DIAGNOSIS — C9190 Lymphoid leukemia, unspecified not having achieved remission: Secondary | ICD-10-CM

## 2017-07-29 DIAGNOSIS — I509 Heart failure, unspecified: Secondary | ICD-10-CM | POA: Diagnosis not present

## 2017-07-29 DIAGNOSIS — N183 Chronic kidney disease, stage 3 unspecified: Secondary | ICD-10-CM

## 2017-07-29 DIAGNOSIS — Z09 Encounter for follow-up examination after completed treatment for conditions other than malignant neoplasm: Secondary | ICD-10-CM | POA: Diagnosis not present

## 2017-07-29 DIAGNOSIS — C9110 Chronic lymphocytic leukemia of B-cell type not having achieved remission: Secondary | ICD-10-CM

## 2017-07-29 NOTE — Progress Notes (Signed)
Nathan Richards 71 y.o.   Chief Complaint  Patient presents with  . Hospitalization Follow-up    was in hospital for CHF x3 days here to follow up    HISTORY OF PRESENT ILLNESS: This is a 71 y.o. male here for hospital medical follow up; seen  by me 11/28 and sent to Hospital with CHF; spent 3 days; doing much better; on Lasix and Bidil. No complaints. HPI   Prior to Admission medications   Medication Sig Start Date End Date Taking? Authorizing Provider  furosemide (LASIX) 40 MG tablet Take 1.5 tablets (60 mg total) by mouth 2 (two) times daily. 07/24/17 07/24/18 Yes Alphonzo Grieve, MD  isosorbide-hydrALAZINE (BIDIL) 20-37.5 MG tablet Take 1 tablet by mouth 3 (three) times daily. 07/24/17  Yes Alphonzo Grieve, MD    Allergies  Allergen Reactions  . Ace Inhibitors     REACTION: cough  . Amlodipine Besylate     REACTION: Impotence  . Beta Adrenergic Blockers     REACTION: Impotence  . Hydrochlorothiazide Other (See Comments)    May have contributed to his hypercalcemia    Patient Active Problem List   Diagnosis Date Noted  . Acute combined systolic (congestive) and diastolic (congestive) heart failure ([place]) 07/23/2017  . Chronic anemia 06/24/2017  . Itching 06/24/2017  . CLL (chronic lymphocytic leukemia) ([place]) 01/28/2017  . Chronic renal failure 01/28/2017  . Nocturia 03/21/2014  . Bilateral leg pain 02/22/2014  . Eustachian tube dysfunction 10/21/2013  . Cough 10/04/2013  . Vision changes 09/27/2013  . Noncompliance with medications 09/27/2013  . Acquired blepharoptosis 04/19/2013  . Gastritis and gastroduodenitis 03/14/2013  . CKD (chronic kidney disease) stage 4, GFR 15-29 ml/min (HCC) 02/11/2010  . Other ganglion and cyst of synovium, tendon, and bursa(727.49) 02/08/2010  . 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 CLL 07/26/2007  . ERECTILE DYSFUNCTION, SECONDARY TO MEDICATION 11/16/2006  . HYPERLIPIDEMIA 10/22/2006  . SCHIZOPHRENIA 10/22/2006  . DEPRESSION,  MAJOR, RECURRENT 10/22/2006  . HEARING LOSS NOS OR DEAFNESS 10/22/2006  . Essential hypertension 10/22/2006  . ASCVD 10/22/2006  . OSTEOARTHRITIS, MULTI SITES 10/22/2006    Past Medical History:  Diagnosis Date  . CHF (congestive heart failure) ([place])   . Chronic kidney disease   . CLL (chronic lymphocytic leukemia) ([place])   . Dyspnea   . History of kidney stones   . Hypertension   . Urolithiasis    cystoscopy in 2002    Past Surgical History:  Procedure Laterality Date  . CORONARY ANGIOPLASTY WITH STENT PLACEMENT  02/05/2004   drug eluting to LAD plus baloon 1st diagonal  . INGUINAL HERNIA REPAIR     bilateral  . KNEE ARTHROSCOPY     right, done in [place]  . UMBILICAL HERNIA REPAIR      Social History   Socioeconomic History  . Marital status: Married    Spouse name: Not on file  . Number of children: Not on file  . Years of education: Not on file  . Highest education level: Not on file  Social Needs  . Financial resource strain: Not on file  . Food insecurity - worry: Not on file  . Food insecurity - inability: Not on file  . Transportation needs - medical: Not on file  . Transportation needs - non-medical: Not on file  Occupational History    Employer: UNEMPLOYED  Tobacco Use  . Smoking status: Former Smoker    Years: 31.00    Types: Cigarettes    Last attempt to quit:  12/22/1997    Years since quitting: 19.6  . Smokeless tobacco: Never Used  Substance and Sexual Activity  . Alcohol use: No  . Drug use: No    Comment: remote use of MJ and cocaine  . Sexual activity: No  Other Topics Concern  . Not on file  Social History Narrative   Born [place], lived in [place]. For years before moving to [place]   Married, currently going through a divorce. Wife is in [place] alone    He has a son living here from a prior relationship   He and his current wife have a daughter and son together   Another son was killed at age 25   2 grand daughters in [place], [place], Nathan Richards lives locally   Disabled by psychiatric disease.    [employer]    Family History  Problem Relation Age of Onset  . Diabetes Mother   . Coronary artery disease Mother   . Coronary artery disease Father        died  . Diabetes Father   . Diabetes Brother   . Diabetes Brother        died     Review of Systems  Constitutional: Negative.  Negative for chills, diaphoresis and fever.  HENT: Negative.  Negative for nosebleeds and sore throat.   Eyes: Negative.   Respiratory: Negative.  Negative for cough and shortness of breath.   Cardiovascular: Negative.  Negative for chest pain, palpitations and leg swelling.  Gastrointestinal: Negative.  Negative for abdominal pain, diarrhea, nausea and vomiting.  Genitourinary: Negative.  Negative for dysuria and hematuria.  Musculoskeletal: Negative.  Negative for back pain, joint pain and myalgias.  Skin: Negative.   Neurological: Negative.  Negative for dizziness and headaches.  Endo/Heme/Allergies: Negative.   All other systems reviewed and are negative.   Vitals:   07/29/17 1011  BP: (!) 142/72  Pulse: 74  Resp: 16  Temp: 98 F (36.7 C)  SpO2: 100%    Physical Exam  Constitutional: He is oriented to person, place, and time. He appears well-developed and well-nourished.  HENT:  Head: Normocephalic and atraumatic.  Eyes: EOM are normal. Pupils are equal, round, and reactive to light.  Neck: Normal range of motion. No JVD present.  Cardiovascular: Normal rate and regular rhythm.  Pulmonary/Chest: Effort normal and breath sounds normal.  Abdominal: Soft. There is no tenderness.  Musculoskeletal: Normal range of motion.  Neurological: He is alert and oriented to person, place, and time. No sensory deficit. He exhibits normal muscle tone.  Skin: Skin is warm and dry. Capillary refill takes less than 2 seconds. No rash noted.  Psychiatric: He has a normal mood and affect. His  behavior is normal.  Vitals reviewed.  A total of 25 minutes was spent in the room with the patient, greater than 50% of which was in counseling/coordination of care regarding chronic medical problems.   ASSESSMENT & PLAN: Nathan Richards was seen today for hospitalization follow-up.  Diagnoses and all orders for this visit:  Hospital discharge follow-up  Chronic congestive heart failure, unspecified heart failure type (HCC)  CLL (chronic lymphocytic leukemia) (HCC)  Chronic renal failure, stage 3 (moderate) (HCC)    Patient Instructions   Heart Failure Heart failure means your heart has trouble pumping blood. This makes it hard for your body to work well. Heart failure is usually a long-term (chronic) condition. You must take good care of yourself  and follow your doctor's treatment plan. Follow these instructions at home:  Take your heart medicine as told by your doctor. ? Do not stop taking medicine unless your doctor tells you to. ? Do not skip any dose of medicine. ? Refill your medicines before they run out. ? Take other medicines only as told by your doctor or pharmacist.  Stay active if told by your doctor. The elderly and people with severe heart failure should talk with a doctor about physical activity.  Eat heart-healthy foods. Choose foods that are without trans fat and are low in saturated fat, cholesterol, and salt (sodium). This includes fresh or frozen fruits and vegetables, fish, lean meats, fat-free or low-fat dairy foods, whole grains, and high-fiber foods. Lentils and dried peas and beans (legumes) are also good choices.  Limit salt if told by your doctor.  Cook in a healthy way. Roast, grill, broil, bake, poach, steam, or stir-fry foods.  Limit fluids as told by your doctor.  Weigh yourself every morning. Do this after you pee (urinate) and before you eat breakfast. Write down your weight to give to your doctor.  Take your blood pressure and write it down if  your doctor tells you to.  Ask your doctor how to check your pulse. Check your pulse as told.  Lose weight if told by your doctor.  Stop smoking or chewing tobacco. Do not use gum or patches that help you quit without your doctor's approval.  Schedule and go to doctor visits as told.  Nonpregnant women should have no more than 1 drink a day. Men should have no more than 2 drinks a day. Talk to your doctor about drinking alcohol.  Stop illegal drug use.  Stay current with shots (immunizations).  Manage your health conditions as told by your doctor.  Learn to manage your stress.  Rest when you are tired.  If it is really hot outside: ? Avoid intense activities. ? Use air conditioning or fans, or get in a cooler place. ? Avoid caffeine and alcohol. ? Wear loose-fitting, lightweight, and light-colored clothing.  If it is really cold outside: ? Avoid intense activities. ? Layer your clothing. ? Wear mittens or gloves, a hat, and a scarf when going outside. ? Avoid alcohol.  Learn about heart failure and get support as needed.  Get help to maintain or improve your quality of life and your ability to care for yourself as needed. Contact a doctor if:  You gain weight quickly.  You are more short of breath than usual.  You cannot do your normal activities.  You tire easily.  You cough more than normal, especially with activity.  You have any or more puffiness (swelling) in areas such as your hands, feet, ankles, or belly (abdomen).  You cannot sleep because it is hard to breathe.  You feel like your heart is beating fast (palpitations).  You get dizzy or light-headed when you stand up. Get help right away if:  You have trouble breathing.  There is a change in mental status, such as becoming less alert or not being able to focus.  You have chest pain or discomfort.  You faint. This information is not intended to replace advice given to you by your health care  provider. Make sure you discuss any questions you have with your health care provider. Document Released: 05/20/2008 Document Revised: 01/17/2016 Document Reviewed: 09/27/2012 Elsevier Interactive Patient Education  2017 [employer].     IF you received an  x-ray today, you will receive an invoice from [facility]. Please contact [facility] at [phone] with questions or concerns regarding your invoice.   IF you received labwork today, you will receive an invoice from [facility]. Please contact [facility] at [phone] with questions or concerns regarding your invoice.   Our billing staff will not be able to assist you with questions regarding bills from these companies.  You will be contacted with the lab results as soon as they are available. The fastest way to get your results is to activate your My Chart account. Instructions are located on the last page of this paperwork. If you have not heard from [place] regarding the results in 2 weeks, please contact this office.      Insuficiencia cardaca (Heart Failure) Insuficiencia cardaca significa que el corazn tiene problemas para bombear la [place]. Esto dificulta el buen funcionamiento del organismo. La insuficiencia cardaca es una enfermedad de larga duracin (crnica). Es importante que se cuide mucho y que siga el plan de tratamiento que le indique el mdico. CUIDADOS EN [address] [employer] medicamentos para el corazn tal como se los prescribi el mdico. ? No deje de tomar medicamentos excepto que su mdico lo indique ? No se saltee ninguna dosis del medicamento. ? Provase de los medicamentos antes de que se acaben. ? Tome medicamentos slo como lo indique su mdico o Development worker, international aid.  Permanezca activo si el mdico lo indica. Las Financial trader y los que tengan insuficiencia cardaca grave deben hablar con el mdico acerca de la actividad fsica.  Consuma alimentos saludables para el corazn. Elija alimentos  que no contengan grasas trans y sean bajas en grasas saturadas, colesterol y sal (sodio). Esto incluye frutas frescas o congeladas y vegetales, pescado, carnes [place], productos lcteos sin grasa o bajos en grasa, granos enteros y alimentos ricos en fibra. Las lentejas, arvejas y frijoles (legumbres) son tambin buenas opciones.  Limitar el consumo de sal segn lo aconsejado por su mdico.  Cocine en forma saludable. Prepare los alimentos asados, a la parrilla, al horno, hervidos, al vapor o salteados.  Limite los lquidos segn lo aconsejado por su mdico.  Controle su peso todas las [place]. Hgalo despus de hacer pis (orinar) y antes de tomar el desayuno. Anote su peso para llevarlo a la consulta con el mdico.  Tmese la presin arterial y antela, si su mdico se lo indica.  Pregunte al mdico como controlarse el pulso. Controle su pulso segn las indicaciones.  Baje de peso si el mdico se lo indica.  Deje de fumar o mascar tabaco. No use goma de Higher education careers adviser o parches para dejar de fumar sin la aprobacin de su mdico.  Programe y concurra a las citas con el mdico segn lo indicado.  Las mujeres no embarazadas no deben tomar ms de 1 bebida al [employer]. Los hombres no deben tomar ms de 2 bebidas al [employer]. Hable con su mdico acerca de su consumo de alcohol.  No consuma drogas.  [place] (immunizaciones).  Controle sus enfermedades segn lo indicado por su mdico.  Aprenda a Engineer, maintenance (IT).  Descanse cuando se sienta cansado.  Si hace mucho calor en el exterior: ? Evite las actividades intensas. ? Utilice aire acondicionado o ventiladores, o pngase en un lugar ms fresco. ? Evite la cafena y el alcohol. ? Use ropa holgada, ligera y de colores claros.  Si hace mucho fro en el exterior: ? Evite las actividades intensas. ? Vstase con  ropa en capas. ? Use mitones o guantes, un sombrero y [place] bufanda cuando salga. ? Evite el alcohol.  Aprenda todo  sobre la insuficiencia cardaca y [place] apoyo si lo necesita.  Obtenga ayuda para mantener o mejorar su calidad de vida y su capacidad para cuidarse a s mismo, si lo necesita.  SOLICITE AYUDA SI:  Aumenta de peso rpidamente.  Le falta el aire ms que lo habitual.  No puede hacer sus actividades habituales.  Se cansa con facilidad.  Tose ms de lo normal, especialmente al realizar actividad fsica.  Observa que se le hinchan o le aumenta la hinchazn (inflamacin) en reas como las manos, los pies, los tobillos o el vientre (abdomen).  Le cuesta dormir debido a que Film/video editor.  Siente que el corazn palpita rpido (palpitaciones).  Siente mareos o vahdos al pararse.  SOLICITE AYUDA DE INMEDIATO SI:  Tiene dificultad para respirar.  Hay un cambio en su estado mental, como estar menos alerta o no poder concentrarse.  Siente dolor u opresin en el pecho.  Se desmaya.  ASEGRESE DE QUE:  Comprende estas instrucciones.  Controlar su enfermedad.  Solicitar ayuda de inmediato si no mejora o si empeora.  Esta informacin no tiene Marine scientist el consejo del mdico. Asegrese de hacerle al mdico cualquier pregunta que tenga. Document Released: 11/07/2008 Document Revised: 09/01/2014 Document Reviewed: 09/27/2012 Elsevier Interactive Patient Education  2017 [employer].  Heart Failure Heart failure means your heart has trouble pumping blood. This makes it hard for your body to work well. Heart failure is usually a long-term (chronic) condition. You must take good care of yourself and follow your doctor's treatment plan. Follow these instructions at home:  Take your heart medicine as told by your doctor. ? Do not stop taking medicine unless your doctor tells you to. ? Do not skip any dose of medicine. ? Refill your medicines before they run out. ? Take other medicines only as told by your doctor or pharmacist.  Stay active if told by your  doctor. The elderly and people with severe heart failure should talk with a doctor about physical activity.  Eat heart-healthy foods. Choose foods that are without trans fat and are low in saturated fat, cholesterol, and salt (sodium). This includes fresh or frozen fruits and vegetables, fish, lean meats, fat-free or low-fat dairy foods, whole grains, and high-fiber foods. Lentils and dried peas and beans (legumes) are also good choices.  Limit salt if told by your doctor.  Cook in a healthy way. Roast, grill, broil, bake, poach, steam, or stir-fry foods.  Limit fluids as told by your doctor.  Weigh yourself every morning. Do this after you pee (urinate) and before you eat breakfast. Write down your weight to give to your doctor.  Take your blood pressure and write it down if your doctor tells you to.  Ask your doctor how to check your pulse. Check your pulse as told.  Lose weight if told by your doctor.  Stop smoking or chewing tobacco. Do not use gum or patches that help you quit without your doctor's approval.  Schedule and go to doctor visits as told.  Nonpregnant women should have no more than 1 drink a day. Men should have no more than 2 drinks a day. Talk to your doctor about drinking alcohol.  Stop illegal drug use.  Stay current with shots (immunizations).  Manage your health conditions as told by your doctor.  Learn to manage your stress.  Rest when you are tired.  If it is really hot outside: ? Avoid intense activities. ? Use air conditioning or fans, or get in a cooler place. ? Avoid caffeine and alcohol. ? Wear loose-fitting, lightweight, and light-colored clothing.  If it is really cold outside: ? Avoid intense activities. ? Layer your clothing. ? Wear mittens or gloves, a hat, and a scarf when going outside. ? Avoid alcohol.  Learn about heart failure and get support as needed.  Get help to maintain or improve your quality of life and your ability to  care for yourself as needed. Contact a doctor if:  You gain weight quickly.  You are more short of breath than usual.  You cannot do your normal activities.  You tire easily.  You cough more than normal, especially with activity.  You have any or more puffiness (swelling) in areas such as your hands, feet, ankles, or belly (abdomen).  You cannot sleep because it is hard to breathe.  You feel like your heart is beating fast (palpitations).  You get dizzy or light-headed when you stand up. Get help right away if:  You have trouble breathing.  There is a change in mental status, such as becoming less alert or not being able to focus.  You have chest pain or discomfort.  You faint. This information is not intended to replace advice given to you by your health care provider. Make sure you discuss any questions you have with your health care provider. Document Released: 05/20/2008 Document Revised: 01/17/2016 Document Reviewed: 09/27/2012 Elsevier Interactive Patient Education  2017 Elsevier Inc.      Agustina Caroli, MD [facility]

## 2017-07-29 NOTE — Patient Instructions (Addendum)
Heart Failure Heart failure means your heart has trouble pumping blood. This makes it hard for your body to work well. Heart failure is usually a long-term (chronic) condition. You must take good care of yourself and follow your doctor's treatment plan. Follow these instructions at home:  Take your heart medicine as told by your doctor. ? Do not stop taking medicine unless your doctor tells you to. ? Do not skip any dose of medicine. ? Refill your medicines before they run out. ? Take other medicines only as told by your doctor or pharmacist.  Stay active if told by your doctor. The elderly and people with severe heart failure should talk with a doctor about physical activity.  Eat heart-healthy foods. Choose foods that are without trans fat and are low in saturated fat, cholesterol, and salt (sodium). This includes fresh or frozen fruits and vegetables, fish, lean meats, fat-free or low-fat dairy foods, whole grains, and high-fiber foods. Lentils and dried peas and beans (legumes) are also good choices.  Limit salt if told by your doctor.  Cook in a healthy way. Roast, grill, broil, bake, poach, steam, or stir-fry foods.  Limit fluids as told by your doctor.  Weigh yourself every morning. Do this after you pee (urinate) and before you eat breakfast. Write down your weight to give to your doctor.  Take your blood pressure and write it down if your doctor tells you to.  Ask your doctor how to check your pulse. Check your pulse as told.  Lose weight if told by your doctor.  Stop smoking or chewing tobacco. Do not use gum or patches that help you quit without your doctor's approval.  Schedule and go to doctor visits as told.  Nonpregnant women should have no more than 1 drink a day. Men should have no more than 2 drinks a day. Talk to your doctor about drinking alcohol.  Stop illegal drug use.  Stay current with shots (immunizations).  Manage your health conditions as told by your  doctor.  Learn to manage your stress.  Rest when you are tired.  If it is really hot outside: ? Avoid intense activities. ? Use air conditioning or fans, or get in a cooler place. ? Avoid caffeine and alcohol. ? Wear loose-fitting, lightweight, and light-colored clothing.  If it is really cold outside: ? Avoid intense activities. ? Layer your clothing. ? Wear mittens or gloves, a hat, and a scarf when going outside. ? Avoid alcohol.  Learn about heart failure and get support as needed.  Get help to maintain or improve your quality of life and your ability to care for yourself as needed. Contact a doctor if:  You gain weight quickly.  You are more short of breath than usual.  You cannot do your normal activities.  You tire easily.  You cough more than normal, especially with activity.  You have any or more puffiness (swelling) in areas such as your hands, feet, ankles, or belly (abdomen).  You cannot sleep because it is hard to breathe.  You feel like your heart is beating fast (palpitations).  You get dizzy or light-headed when you stand up. Get help right away if:  You have trouble breathing.  There is a change in mental status, such as becoming less alert or not being able to focus.  You have chest pain or discomfort.  You faint. This information is not intended to replace advice given to you by your health care provider. Make sure you  discuss any questions you have with your health care provider. Document Released: 05/20/2008 Document Revised: 01/17/2016 Document Reviewed: 09/27/2012 Elsevier Interactive Patient Education  2017 [employer].     IF you received an x-ray today, you will receive an invoice from [facility] Radiology. Please contact [facility] at [phone] with questions or concerns regarding your invoice.   IF you received labwork today, you will receive an invoice from [facility]. Please contact [facility] at [phone] with  questions or concerns regarding your invoice.   Our billing staff will not be able to assist you with questions regarding bills from these companies.  You will be contacted with the lab results as soon as they are available. The fastest way to get your results is to activate your My Chart account. Instructions are located on the last page of this paperwork. If you have not heard from [place] regarding the results in 2 weeks, please contact this office.      Insuficiencia cardaca (Heart Failure) Insuficiencia cardaca significa que el corazn tiene problemas para bombear la [place]. Esto dificulta el buen funcionamiento del organismo. La insuficiencia cardaca es una enfermedad de larga duracin (crnica). Es importante que se cuide mucho y que siga el plan de tratamiento que le indique el mdico. CUIDADOS EN EL [employer] medicamentos para el corazn tal como se los prescribi el mdico. ? No deje de tomar medicamentos excepto que su mdico lo indique ? No se saltee ninguna dosis del medicamento. ? Provase de los medicamentos antes de que se acaben. ? Tome medicamentos slo como lo indique su mdico o Development worker, international aid.  Permanezca activo si el mdico lo indica. Las Financial trader y los que tengan insuficiencia cardaca grave deben hablar con el mdico acerca de la actividad fsica.  Consuma alimentos saludables para el corazn. Elija alimentos que no contengan grasas trans y sean bajas en grasas saturadas, colesterol y sal (sodio). Esto incluye frutas frescas o congeladas y vegetales, pescado, carnes [place], productos lcteos sin grasa o bajos en grasa, granos enteros y alimentos ricos en fibra. Las lentejas, arvejas y frijoles (legumbres) son tambin buenas opciones.  Limitar el consumo de sal segn lo aconsejado por su mdico.  Cocine en forma saludable. Prepare los alimentos asados, a la parrilla, al horno, hervidos, al vapor o salteados.  Limite los lquidos segn lo aconsejado por su  mdico.  Controle su peso todas las [place]. Hgalo despus de hacer pis (orinar) y antes de tomar el desayuno. Anote su peso para llevarlo a la consulta con el mdico.  Tmese la presin arterial y antela, si su mdico se lo indica.  Pregunte al mdico como controlarse el pulso. Controle su pulso segn las indicaciones.  Baje de peso si el mdico se lo indica.  Deje de fumar o mascar tabaco. No use goma de Higher education careers adviser o parches para dejar de fumar sin la aprobacin de su mdico.  Programe y concurra a las citas con el mdico segn lo indicado.  Las mujeres no embarazadas no deben tomar ms de 1 bebida al [employer]. Los hombres no deben tomar ms de 2 bebidas al [employer]. Hable con su mdico acerca de su consumo de alcohol.  No consuma drogas.  [place] (immunizaciones).  Controle sus enfermedades segn lo indicado por su mdico.  Aprenda a Engineer, maintenance (IT).  Descanse cuando se sienta cansado.  Si hace mucho calor en el exterior: ? Evite las actividades intensas. ? Utilice aire acondicionado o ventiladores, o pngase en  un lugar ms fresco. ? Evite la cafena y el alcohol. ? Use ropa holgada, ligera y de colores claros.  Si hace mucho fro en el exterior: ? Evite las actividades intensas. ? Vstase con ropa en capas. ? Use mitones o guantes, un sombrero y [place] bufanda cuando salga. ? Evite el alcohol.  Aprenda todo sobre la insuficiencia cardaca y [place] apoyo si lo necesita.  Obtenga ayuda para mantener o mejorar su calidad de vida y su capacidad para cuidarse a s mismo, si lo necesita.  SOLICITE AYUDA SI:  Aumenta de peso rpidamente.  Le falta el aire ms que lo habitual.  No puede hacer sus actividades habituales.  Se cansa con facilidad.  Tose ms de lo normal, especialmente al realizar actividad fsica.  Observa que se le hinchan o le aumenta la hinchazn (inflamacin) en reas como las manos, los pies, los tobillos o el vientre  (abdomen).  Le cuesta dormir debido a que Film/video editor.  Siente que el corazn palpita rpido (palpitaciones).  Siente mareos o vahdos al pararse.  SOLICITE AYUDA DE INMEDIATO SI:  Tiene dificultad para respirar.  Hay un cambio en su estado mental, como estar menos alerta o no poder concentrarse.  Siente dolor u opresin en el pecho.  Se desmaya.  ASEGRESE DE QUE:  Comprende estas instrucciones.  Controlar su enfermedad.  Solicitar ayuda de inmediato si no mejora o si empeora.  Esta informacin no tiene Marine scientist el consejo del mdico. Asegrese de hacerle al mdico cualquier pregunta que tenga. Document Released: 11/07/2008 Document Revised: 09/01/2014 Document Reviewed: 09/27/2012 Elsevier Interactive Patient Education  2017 [place].  Heart Failure Heart failure means your heart has trouble pumping blood. This makes it hard for your body to work well. Heart failure is usually a long-term (chronic) condition. You must take good care of yourself and follow your doctor's treatment plan. Follow these instructions at home:  Take your heart medicine as told by your doctor. ? Do not stop taking medicine unless your doctor tells you to. ? Do not skip any dose of medicine. ? Refill your medicines before they run out. ? Take other medicines only as told by your doctor or pharmacist.  Stay active if told by your doctor. The elderly and people with severe heart failure should talk with a doctor about physical activity.  Eat heart-healthy foods. Choose foods that are without trans fat and are low in saturated fat, cholesterol, and salt (sodium). This includes fresh or frozen fruits and vegetables, fish, lean meats, fat-free or low-fat dairy foods, whole grains, and high-fiber foods. Lentils and dried peas and beans (legumes) are also good choices.  Limit salt if told by your doctor.  Cook in a healthy way. Roast, grill, broil, bake, poach, steam,  or stir-fry foods.  Limit fluids as told by your doctor.  Weigh yourself every morning. Do this after you pee (urinate) and before you eat breakfast. Write down your weight to give to your doctor.  Take your blood pressure and write it down if your doctor tells you to.  Ask your doctor how to check your pulse. Check your pulse as told.  Lose weight if told by your doctor.  Stop smoking or chewing tobacco. Do not use gum or patches that help you quit without your doctor's approval.  Schedule and go to doctor visits as told.  Nonpregnant women should have no more than 1 drink a day. Men should have no more than 2 drinks  a day. Talk to your doctor about drinking alcohol.  Stop illegal drug use.  Stay current with shots (immunizations).  Manage your health conditions as told by your doctor.  Learn to manage your stress.  Rest when you are tired.  If it is really hot outside: ? Avoid intense activities. ? Use air conditioning or fans, or get in a cooler place. ? Avoid caffeine and alcohol. ? Wear loose-fitting, lightweight, and light-colored clothing.  If it is really cold outside: ? Avoid intense activities. ? Layer your clothing. ? Wear mittens or gloves, a hat, and a scarf when going outside. ? Avoid alcohol.  Learn about heart failure and get support as needed.  Get help to maintain or improve your quality of life and your ability to care for yourself as needed. Contact a doctor if:  You gain weight quickly.  You are more short of breath than usual.  You cannot do your normal activities.  You tire easily.  You cough more than normal, especially with activity.  You have any or more puffiness (swelling) in areas such as your hands, feet, ankles, or belly (abdomen).  You cannot sleep because it is hard to breathe.  You feel like your heart is beating fast (palpitations).  You get dizzy or light-headed when you stand up. Get help right away if:  You have  trouble breathing.  There is a change in mental status, such as becoming less alert or not being able to focus.  You have chest pain or discomfort.  You faint. This information is not intended to replace advice given to you by your health care provider. Make sure you discuss any questions you have with your health care provider. Document Released: 05/20/2008 Document Revised: 01/17/2016 Document Reviewed: 09/27/2012 Elsevier Interactive Patient Education  2017 [employer].

## 2017-07-30 ENCOUNTER — Other Ambulatory Visit: Payer: Self-pay | Admitting: *Deleted

## 2017-07-30 NOTE — Patient Outreach (Signed)
[facility]) Care Management  07/30/2017  Nathan Richards 10/31/1945 [id number]   Transition of care Interpretor ID# [id number] Dopacio Nathan Richards (Spanish)  RN requested interpretor and spoke with pt today and confirmed identifiers. Introduced the [facility] program and services and attempt to address pt's immediate needs. Pt confirmed he is taking all her prescribed medications as instructed and declined [place]. Pt confirms he does need scales to monitor his HF and requested RN to verify the address to an upcoming appointment for 12/10. RN researched and verified the address for this appointment at the [place] [address] floor and provided a contact number. Based upon thelanguage barrier RN offered a home visit to further engage with this pt. Pt requested a visit on next Tuesday at 11:00AM. RN will contact the [employer] de[facility] and requested an interpreter for that day and hour in the community. No other request or inquires at this time.  Plan to obtain a consent, request scales to be mailed to pt, generate a plan of care for disease management program and address needs accordingly on the home visit that has been scheduled. Note pt did not wish to review his discharge orders because he can not find them and they are not in [place]. RN inquired if he needs the discharge orders to be printed in [place] (pt declined).   Will follow up accordingly and provide needed tools for his management of care.  Raina Mina, RN Care Management Coordinator [place] Office [phone]

## 2017-08-03 ENCOUNTER — Ambulatory Visit: Payer: Medicare Other | Admitting: Family

## 2017-08-04 ENCOUNTER — Other Ambulatory Visit: Payer: Self-pay | Admitting: *Deleted

## 2017-08-04 ENCOUNTER — Ambulatory Visit: Payer: Medicare Other | Admitting: *Deleted

## 2017-08-04 NOTE — Patient Outreach (Addendum)
[facility] Care Management  08/04/2017  Nathan Richards [birth year] [id number]  Transition of care   RN attempted outreach call today for a transition of care care and reschedule today's home visit due to increment weather using an interpreter Nathan Richards [phone] however pt not available so a voice message was left that RN would call back and rescheduled on another day. RN cancelled today's appointment with the interpreting agency ([employer]) who confirmed cancellation with the visiting interpreter. Will attempt another outreach and rescheduled the initial appointment over the next week.  Nathan Mina, RN Care Management Coordinator [place] Office [phone]

## 2017-08-07 ENCOUNTER — Encounter: Payer: Self-pay | Admitting: *Deleted

## 2017-08-07 ENCOUNTER — Other Ambulatory Visit: Payer: Self-pay | Admitting: *Deleted

## 2017-08-07 NOTE — Patient Outreach (Signed)
Nathan Richards) Care Management  08/07/2017  Nathan Richards April 10, 1946 [id number]   TRANSITION OF CARE INTERPRETER: Sol Blazing  VN#[id number] (Spanish)  RN inquired through the interpreter contacted pt today and rescheduled the initial home visit for next week. RN also completed the initial assessment as pt was available to answer some inquires concerning his health. RN inquired more on pt's medical condition and focused on his HF. Discussed the HF zones and verified pt currently is in the GREEN zone with no precipitating symptoms. RN discussed HF program and plan of care was generated around this medical condition based upon the lack of knowledge and pt's recent hospitalizations. Goals were also generated and discussed accordingly. RN has requested an order of scales to be sent to pt and informed pt that a packet would arrive. RN strongly encouraged pt to weigh daily due to the risk of fluid retention and RN would check the readings next week on the scheduled home visit. Verified no swelling, SOB, congestion and no coughing with sputum. Pt denies any of the symptoms discussed today. Pt indicated he missed a doctor's appointment on Monday due to the increment weather and request RN's assistance. RN able to research and contact the provider's office while the interpreter was on the line and rescheduled pt's appointment (post-op hospital appointment) for next week. Pt with clear understanding and aware where this provider is located. Pt stated while he was in the hospital he was offered some treatment that he now wishes to received. RN explained this information to the provider's office however they will completed an office visit prior to making arrangements for the offered treatment. Pt again with a clear understanding that he will not received this treatment initially on the follow up office visit but this would be discussed for future therapy. RN strongly encouraged pt to discussed any symptoms of  issues related to his health on the visit next week. No additional request or inquires at this time. Will follow up according to scheduled home visit appointment next week. Will update provider accordingly.  Patient was recently discharged from hospital and all medications have been reviewed.  Raina Mina, RN Care Management Coordinator [place] Office [phone]

## 2017-08-12 ENCOUNTER — Encounter: Payer: Self-pay | Admitting: *Deleted

## 2017-08-13 ENCOUNTER — Telehealth: Payer: Self-pay | Admitting: *Deleted

## 2017-08-13 ENCOUNTER — Other Ambulatory Visit (HOSPITAL_BASED_OUTPATIENT_CLINIC_OR_DEPARTMENT_OTHER): Payer: Medicare Other

## 2017-08-13 ENCOUNTER — Encounter: Payer: Self-pay | Admitting: Hematology & Oncology

## 2017-08-13 ENCOUNTER — Ambulatory Visit (HOSPITAL_BASED_OUTPATIENT_CLINIC_OR_DEPARTMENT_OTHER): Payer: Medicare Other | Admitting: Hematology & Oncology

## 2017-08-13 ENCOUNTER — Ambulatory Visit (HOSPITAL_COMMUNITY)
Admission: RE | Admit: 2017-08-13 | Discharge: 2017-08-13 | Disposition: A | Discharge status: Home / Self Care | Payer: Medicare Other | Source: Ambulatory Visit | Attending: Hematology & Oncology | Admitting: Hematology & Oncology

## 2017-08-13 ENCOUNTER — Other Ambulatory Visit (HOSPITAL_COMMUNITY)
Admission: RE | Admit: 2017-08-13 | Discharge: 2017-08-13 | Disposition: A | Discharge status: Home / Self Care | Payer: Medicare Other | Source: Ambulatory Visit | Attending: Hematology & Oncology | Admitting: Hematology & Oncology

## 2017-08-13 ENCOUNTER — Other Ambulatory Visit: Payer: Self-pay

## 2017-08-13 VITALS — BP 163/77 | HR 85 | Temp 97.9°F | Resp 18 | Wt 119.0 lb

## 2017-08-13 DIAGNOSIS — D649 Anemia, unspecified: Secondary | ICD-10-CM | POA: Diagnosis present

## 2017-08-13 DIAGNOSIS — C9110 Chronic lymphocytic leukemia of B-cell type not having achieved remission: Secondary | ICD-10-CM | POA: Diagnosis not present

## 2017-08-13 DIAGNOSIS — C9190 Lymphoid leukemia, unspecified not having achieved remission: Secondary | ICD-10-CM | POA: Insufficient documentation

## 2017-08-13 DIAGNOSIS — Z79899 Other long term (current) drug therapy: Secondary | ICD-10-CM | POA: Diagnosis not present

## 2017-08-13 DIAGNOSIS — N289 Disorder of kidney and ureter, unspecified: Secondary | ICD-10-CM

## 2017-08-13 LAB — CMP (CANCER CENTER ONLY)
ALBUMIN: 3.1 g/dL — AB (ref 3.3–5.5)
ALT(SGPT): 17 U/L (ref 10–47)
AST: 19 U/L (ref 11–38)
Alkaline Phosphatase: 123 U/L — ABNORMAL HIGH (ref 26–84)
BUN, Bld: 64 mg/dL — ABNORMAL HIGH (ref 7–22)
CALCIUM: 8.8 mg/dL (ref 8.0–10.3)
CHLORIDE: 105 meq/L (ref 98–108)
CO2: 24 mEq/L (ref 18–33)
Creat: 3.8 mg/dl (ref 0.6–1.2)
GLUCOSE: 110 mg/dL (ref 73–118)
Potassium: 3.9 mEq/L (ref 3.3–4.7)
SODIUM: 144 meq/L (ref 128–145)
Total Bilirubin: 0.5 mg/dl (ref 0.20–1.60)
Total Protein: 7 g/dL (ref 6.4–8.1)

## 2017-08-13 LAB — CBC WITH DIFFERENTIAL (CANCER CENTER ONLY)
BASO#: 0 10*3/uL (ref 0.0–0.2)
BASO%: 0.2 % (ref 0.0–2.0)
EOS ABS: 1.2 10*3/uL — AB (ref 0.0–0.5)
EOS%: 6 % (ref 0.0–7.0)
HEMATOCRIT: 21.3 % — AB (ref 38.7–49.9)
HEMOGLOBIN: 7.2 g/dL — AB (ref 13.0–17.1)
LYMPH#: 16.7 10*3/uL — ABNORMAL HIGH (ref 0.9–3.3)
LYMPH%: 81.1 % — ABNORMAL HIGH (ref 14.0–48.0)
MCH: 32.6 pg (ref 28.0–33.4)
MCHC: 33.8 g/dL (ref 32.0–35.9)
MCV: 96 fL (ref 82–98)
MONO#: 0.6 10*3/uL (ref 0.1–0.9)
MONO%: 2.8 % (ref 0.0–13.0)
NEUT%: 9.9 % — AB (ref 40.0–80.0)
NEUTROS ABS: 2 10*3/uL (ref 1.5–6.5)
Platelets: 143 10*3/uL — ABNORMAL LOW (ref 145–400)
RBC: 2.21 10*6/uL — ABNORMAL LOW (ref 4.20–5.70)
RDW: 14.4 % (ref 11.1–15.7)
WBC: 20.6 10*3/uL — ABNORMAL HIGH (ref 4.0–10.0)

## 2017-08-13 LAB — CHCC SATELLITE - SMEAR

## 2017-08-13 NOTE — Progress Notes (Signed)
Patient is accompanied by interpreter Mickel Baas from [employer].

## 2017-08-13 NOTE — Telephone Encounter (Signed)
Critical Value Creatinine 3.8 Dr Marin Olp notified. No orders at this time

## 2017-08-13 NOTE — Progress Notes (Signed)
Hematology and Oncology Follow Up Visit  Nathan Richards [id number] [birth year] 71 y.o. 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  We last saw him back in August.  Since then, he has not had any additional test done.  He did end up in the hospital a week or so ago.  He had marked anemia.  I think this is from his CLL.  He has light chain production that is causing tubule obstruction.  I think it is apparent that his decline will continue if he does not get any treatment.  He seems to be eating okay.  He has had no problems with nausea or vomiting.  He has had no bleeding.  He has had no diarrhea.  He comes in with his interpreter.  She did a fantastic job.  His blood pressure is doing better.  Again, he was hospitalized because of anemia and high blood pressure issues.  These were addressed.  He had an echocardiogram done.  This was done in November.  His left ventricular ejection fraction was only 35-40%.  He had diffuse hypokinesis.  Again, I am not sure how well he is taking care of himself at home.  He has had no rashes.  There is been no leg swelling.  Currently, I said his performance status is ECOG 2.  Medications:  Current Outpatient Medications:  .  furosemide (LASIX) 40 MG tablet, Take 1.5 tablets (60 mg total) by mouth 2 (two) times daily., Disp: 60 tablet, Rfl: 2 .  isosorbide-hydrALAZINE (BIDIL) 20-37.5 MG tablet, Take 1 tablet by mouth 3 (three) times daily., Disp: 90 tablet, Rfl: 2  Allergies:  Allergies  Allergen Reactions  . Ace Inhibitors     REACTION: cough  . Amlodipine Besylate     REACTION: Impotence  . Beta Adrenergic Blockers     REACTION: Impotence  . Hydrochlorothiazide Other (See Comments)    May have contributed to his hypercalcemia     Past Medical History, Surgical history, Social history, and Family History were reviewed and updated.  Review of Systems: As stated in the interim history  Physical Exam:  weight is 119 lb (54 kg). His oral temperature is 97.9 F (36.6 C). His blood pressure is 163/77 (abnormal) and his pulse is 85. His respiration is 18 and oxygen saturation is 100%.   Wt Readings from Last 3 Encounters:  08/13/17 119 lb (54 kg)  07/29/17 115 lb 3.2 oz (52.3 kg)  07/23/17 113 lb 15.7 oz (51.7 kg)     Physical Exam  Constitutional: He is oriented to person, place, and time.  HENT:  Head: Normocephalic and atraumatic.  Mouth/Throat: Oropharynx is clear and moist.  Eyes: EOM are normal. Pupils are equal, round, and reactive to light.  Neck: Normal range of motion.  Cardiovascular: Normal rate, regular rhythm and normal heart sounds.  Pulmonary/Chest: Effort normal and breath sounds normal.  Abdominal: Soft. Bowel sounds are normal.  Musculoskeletal: Normal range of motion. He exhibits no edema, tenderness or deformity.  Lymphadenopathy:    He has no cervical adenopathy.  Neurological: He is alert and oriented to person, place, and time.  Skin: Skin is warm and dry. No rash noted. No erythema.  Psychiatric: He has a normal mood and  affect. His behavior is normal. Judgment and thought content normal.  Vitals reviewed.    Lab Results  Component Value Date   WBC 20.6 (H) 08/13/2017   HGB 7.2 (L) 08/13/2017   HCT 21.3 (L) 08/13/2017   MCV 96 08/13/2017   PLT 143 (L) 08/13/2017     Chemistry      Component Value Date/Time   NA 136 07/23/2017 1013   NA 139 07/22/2017 1453   NA 139 04/02/2017 1058   K 4.3 07/23/2017 1013   K 3.8 04/02/2017 1058   CL 108 07/23/2017 1013   CL 111 (H) 04/02/2017 1058   CO2 22 07/23/2017 1013   CO2 27 04/02/2017 1058   BUN 48 (H) 07/23/2017 1013   BUN 48 (H) 07/22/2017 1453   BUN 51 (H) 04/02/2017 1058   CREATININE 3.84 (H) 07/23/2017 1013    CREATININE 3.4 (HH) 04/02/2017 1058      Component Value Date/Time   CALCIUM 8.8 (L) 07/23/2017 1013   CALCIUM 9.3 04/02/2017 1058   ALKPHOS 109 07/23/2017 1013   ALKPHOS 105 (H) 04/02/2017 1058   AST 17 07/23/2017 1013   AST 20 04/02/2017 1058   ALT 11 (L) 07/23/2017 1013   ALT 10 04/02/2017 1058   BILITOT 0.4 07/23/2017 1013   BILITOT 0.4 07/22/2017 1453   BILITOT 0.50 04/02/2017 1058         Impression and Plan: Nathan Richards is a 71 year old Hispanic male. He has renal insufficiency. This is secondary to his CLL.  Of note, I have checked his light chain. His lambda light chain was 10.4 mg/dL. He does have an IgG spike of 1657 mg/dL. He does have an M spike of 0.5 g/dL.  I told him that if he does not get treatment, that his kidneys may fail and he will need dialysis. He understands this.  He is agreed to therapy.  Given his renal insufficiency, we are limited as to what we can try with him.  I think our best option is going to be Rituxan/Cytoxan/vincristine/Decadron.  This should have a decent response rate.  May be, if we can get his renal function back to a more normal level, then we might be able to consider 1 of the oral therapies.  He will need to have a Port-A-Cath.  I explained to him what this was.  I went over the side effects of treatment with him.  This was done through his interpreter.  She did a fantastic job.  He will need to be transfused.  I would like to see about transfusing him on Friday.  I spent about 45 minutes with him.  This is incredibly complicated.  The fact that he does not speak English makes this a little bit more tricky.  Hopefully, we will be able to get things started on him right after January 1.  I told him that if we did not get him treated, he would end up on dialysis.  He does not want that to happen.    I will have to see about his cytogenetic studies.   Volanda Napoleon, MD 12/20/201812:55 PM

## 2017-08-13 NOTE — Progress Notes (Signed)
START OFF PATHWAY REGIMEN - Lymphoma and CLL   [id number]:R-CVP q21 days:   A cycle is every 21 days:     Rituximab      Cyclophosphamide      Vincristine      Prednisone   **Always confirm dose/schedule in your pharmacy ordering system**    Patient Characteristics: Chronic Lymphocytic Leukemia (CLL), First Line, Treatment Indicated, 17p del (-) or Unknown, Frail Patient Disease Type: Chronic Lymphocytic Leukemia (CLL) Disease Type: Not Applicable Disease Type: Not Applicable Line of therapy: First Line RAI Stage: IV Treatment Indicated<= Treatment Indicated 17p Deletion Status: Negative Fit or Frail Patient<= Frail Patient Intent of Therapy: Non-Curative / Palliative Intent, Discussed with Patient

## 2017-08-14 ENCOUNTER — Ambulatory Visit: Payer: Medicare Other

## 2017-08-14 ENCOUNTER — Encounter: Payer: Self-pay | Admitting: *Deleted

## 2017-08-14 ENCOUNTER — Other Ambulatory Visit: Payer: Self-pay | Admitting: *Deleted

## 2017-08-14 ENCOUNTER — Telehealth: Payer: Self-pay | Admitting: Emergency Medicine

## 2017-08-14 DIAGNOSIS — C9190 Lymphoid leukemia, unspecified not having achieved remission: Secondary | ICD-10-CM | POA: Diagnosis not present

## 2017-08-14 DIAGNOSIS — C9110 Chronic lymphocytic leukemia of B-cell type not having achieved remission: Secondary | ICD-10-CM

## 2017-08-14 LAB — PREPARE RBC (CROSSMATCH)

## 2017-08-14 LAB — KAPPA/LAMBDA LIGHT CHAINS
IG KAPPA FREE LIGHT CHAIN: 109.1 mg/L — AB (ref 3.3–19.4)
Ig Lambda Free Light Chain: 122.2 mg/L — ABNORMAL HIGH (ref 5.7–26.3)
Kappa/Lambda FluidC Ratio: 0.89 (ref 0.26–1.65)

## 2017-08-14 LAB — IGG, IGA, IGM
IgA, Qn, Serum: 213 mg/dL (ref 61–437)
IgM, Qn, Serum: 113 mg/dL (ref 20–172)

## 2017-08-14 LAB — HEPATITIS PANEL, ACUTE
HEP A IGM: NEGATIVE
HEP B C IGM: NEGATIVE
HEP B S AG: NEGATIVE
Hep C Virus Ab: 0.1 s/co ratio (ref 0.0–0.9)

## 2017-08-14 LAB — ABO/RH: ABO/RH(D): O POS

## 2017-08-14 MED ORDER — DIPHENHYDRAMINE HCL 25 MG PO CAPS
ORAL_CAPSULE | ORAL | Status: AC
  Filled 2017-08-14: qty 1

## 2017-08-14 MED ORDER — ACETAMINOPHEN 325 MG PO TABS
650.0000 mg | ORAL_TABLET | Freq: Once | ORAL | Status: AC
  Administered 2017-08-14: 650 mg via ORAL

## 2017-08-14 MED ORDER — FUROSEMIDE 10 MG/ML IJ SOLN: 40.0000 mg | Freq: Once | INTRAMUSCULAR | Status: DC

## 2017-08-14 MED ORDER — DIPHENHYDRAMINE HCL 25 MG PO CAPS
25.0000 mg | ORAL_CAPSULE | Freq: Once | ORAL | Status: AC
  Administered 2017-08-14: 25 mg via ORAL

## 2017-08-14 MED ORDER — ACETAMINOPHEN 325 MG PO TABS
ORAL_TABLET | ORAL | Status: AC
  Filled 2017-08-14: qty 2

## 2017-08-14 NOTE — Addendum Note (Signed)
Addended by: Burney Gauze R on: 08/14/2017 12:36 PM   Modules accepted: Orders

## 2017-08-14 NOTE — Progress Notes (Signed)
Upon verifying second unit of blood, the bag of blood fell of the mobile computer and broken open onto the floor. NO harm to the patient and patient feels well after first unit. Dr. Marin Olp consulted by nurse and he stated the patient does not need to have a second unit, one unit was enough. Patient agreed he did not need another unit.

## 2017-08-14 NOTE — Patient Outreach (Signed)
[place] [facility]) Care Management   08/14/2017  Reyce Lubeck [birth year] [id number]  Santhiago Collingsworth is an 71 y.o. male INTERPRETER: [employer]: Rudene Anda Subjective:  HF: Pt aware of his HF but not aware of the signs and symptoms and when to contact he provider. Pt states he received the scales and has been weighing and provided is readings. Reports his weight at 112 lbs Monday/118 lbs Tuesday/Wed/Thursday and today 119.4 lbs (7 lbs gained over  5 days).  Pt denies any symptoms at this time. SWELLING: Pt does not elevate his lower legs and not aware of how this is affected by his HF. Pt not aware of compression stockings but receptive if needed. MEDICATION: Pt reports is taking his medications with no needed refills or issues. APPOINTMENTS: Pt reports attending all his medical appointments including the appointment this CM rescheduled for this pt this week.    Objective:   Review of Systems  Constitutional: Negative.   HENT: Negative.   Eyes: Negative.   Respiratory:       RLL Slight Rhonchi   Cardiovascular: Positive for leg swelling.       Bilateral edema to lower legs (2+)  Gastrointestinal: Negative.   Genitourinary: Negative.   Musculoskeletal: Negative.   Skin: Negative.   Neurological: Negative.   Endo/Heme/Allergies: Negative.   Psychiatric/Behavioral: Negative.     Physical Exam  Constitutional: He is oriented to person, place, and time. He appears well-developed and well-nourished.  HENT:  Right Ear: External ear normal.  Left Ear: External ear normal.  Eyes: EOM are normal.  Neck: Normal range of motion.  Cardiovascular: Normal heart sounds.  Respiratory:  RRL Slight Rhonchi   GI: Soft. Bowel sounds are normal.  Musculoskeletal: He exhibits edema.  Bilateral welling to lower legs/ankles  Neurological: He is alert and oriented to person, place, and time.  Skin: Skin is warm and dry.  Psychiatric: He has a normal mood and affect. His behavior is  normal. Judgment and thought content normal.    Encounter Medications:   Outpatient Encounter Medications as of 08/14/2017  Medication Sig  . furosemide (LASIX) 40 MG tablet Take 1.5 tablets (60 mg total) by mouth 2 (two) times daily.  . isosorbide-hydrALAZINE (BIDIL) 20-37.5 MG tablet Take 1 tablet by mouth 3 (three) times daily.  . [DISCONTINUED] furosemide (LASIX) injection 40 mg    No facility-administered encounter medications on file as of 08/14/2017.     Functional Status:   In your present state of health, do you have any difficulty performing the following activities: 08/07/2017 07/23/2017  Hearing? - N  Vision? - N  Difficulty concentrating or making decisions? - Y  Walking or climbing stairs? - Y  Dressing or bathing? - N  Doing errands, shopping? - Y  Preparing Food and eating ? N -  Using the Toilet? N -  In the past six months, have you accidently leaked urine? N -  Do you have problems with loss of bowel control? N -  Managing your Medications? N -  Managing your Finances? N -  Housekeeping or managing your Housekeeping? N -  Some recent data might be hidden    Fall/Depression Screening:    Fall Risk  08/07/2017 07/29/2017 07/28/2017  Falls in the past year? Yes Yes Yes  Number falls in past yr: $Remove'1 1 1  'ZvrYzDq$ Injury with Fall? No - No  Risk for fall due to : History of fall(s) - History of fall(s)  Follow up Falls prevention discussed -  Falls evaluation completed   PHQ 2/9 Scores 08/07/2017 07/29/2017 07/28/2017 07/22/2017 06/24/2017 01/28/2017 01/27/2017  PHQ - 2 Score 0 0 0 0 0 0 0  PHQ- 9 Score - - - - - - -  BP 130/78 (BP Location: Left Arm, Patient Position: Sitting, Cuff Size: Normal)   Pulse 76   Resp 20   Ht 1.575 m ($Remove'5\' 2"'cQmMIGs$ )   Wt 119 lb 6.4 oz (54.2 kg)   SpO2 99%   BMI 21.84 kg/m   Assessment:   Ongoing Case management related to HF Respiratory related to RLL rhonchi Follow up on receival of scales and daily weights Follow up attendance on medical  appointments  Plan:  Will verified pt's enrollment and obtain a consent for ongoing case management of HF.  Will educate on HF zones and verified pt's zone today (YELLOW zone) with 6 lbs gained over the last 5 days. Will contact primary provider and alert of pt's symptoms with RLL rhonchi and bilateral 2+ edema to lower ankle/legs. Discuss possible symptoms pt may encounter and what to do if acute symptoms should occur.  Will verify pt's understanding of HF symptoms with positive feedback as pt able to reiterate on the teachings for today. Will also discuss Mucinex to assist with any mucus build up or congestion. Will discussed discoloration and when to reach out to his provider with any precipitating symptoms (pt with clear understanding).  Will verify pt received the scales and continues to weigh daily and document all readings for provider to view. Will continue to encourage pt on this task as part of his plan of care. Will verify pt attendance to all his medical appointments with sufficient transportation. Will offer a second source of transportation resources (declined). Will review all pending appointments over the next few months as pt presented a print out of his appointments.  Will discuss the plan of care and goals accordingly and send today's report to pt's primary provider concerning pt's disposition with [facility] services.  Raina Mina, RN Care Management Coordinator [place] Office [phone]

## 2017-08-14 NOTE — Patient Instructions (Signed)
Blood Transfusion, Adult, Care After This sheet gives you information about how to care for yourself after your procedure. Your health care provider may also give you more specific instructions. If you have problems or questions, contact your health care provider. What can I expect after the procedure? After your procedure, it is common to have:  Bruising and soreness where the IV tube was inserted.  Headache.  Follow these instructions at home:  Take over-the-counter and prescription medicines only as told by your health care provider.  Return to your normal activities as told by your health care provider.  Follow instructions from your health care provider about how to take care of your IV insertion site. Make sure you: ? Wash your hands with soap and water before you change your bandage (dressing). If soap and water are not available, use hand sanitizer. ? Change your dressing as told by your health care provider.  Check your IV insertion site every day for signs of infection. Check for: ? More redness, swelling, or pain. ? More fluid or blood. ? Warmth. ? Pus or a bad smell. Contact a health care provider if:  You have more redness, swelling, or pain around the IV insertion site.  You have more fluid or blood coming from the IV insertion site.  Your IV insertion site feels warm to the touch.  You have pus or a bad smell coming from the IV insertion site.  Your urine turns pink, red, or brown.  You feel weak after doing your normal activities. Get help right away if:  You have signs of a serious allergic or immune system reaction, including: ? Itchiness. ? Hives. ? Trouble breathing. ? Anxiety. ? Chest or lower back pain. ? Fever, flushing, and chills. ? Rapid pulse. ? Rash. ? Diarrhea. ? Vomiting. ? Dark urine. ? Serious headache. ? Dizziness. ? Stiff neck. ? Yellow coloration of the face or the white parts of the eyes (jaundice). This information is not  intended to replace advice given to you by your health care provider. Make sure you discuss any questions you have with your health care provider. Document Released: 09/01/2014 Document Revised: 04/09/2016 Document Reviewed: 02/25/2016 Elsevier Interactive Patient Education  2018 Elsevier Inc.  

## 2017-08-14 NOTE — Telephone Encounter (Signed)
Raina Mina, nurse working with [facility] is calling during home visit with pt, stating that the pt is having 2+ edema in bilateral lower extremities. Pt has been weighing self daily and on Monday pt was 112lbs, Tuesday-118lbs and on today 119.4lbs. Pt is currently taking Lasix $RemoveBefo'60mg'MAysJWEeCCN$  twice a day. Nurse reports that pt is not currently in distress, does not have cough and has no complaints of SOB or wheezing. Nurse does report that the pt's BP is 130/78 and sats were fine. Nurse also reports that a little rhonchi  was heard in the right lower lobe. Lattie Haw states that if wanted compression stockings could be ordered if the physician wants to try that for BLE swelling. Pt has appt previously scheduled for 09/18/17.Lattie Haw can be contacted at [phone].  Pt is also stating that he was told that a cardiologist referral was needed with some one in his insurance network, which is [employer] also need to have an interpreter due to the pt being Spanish speaking.

## 2017-08-15 LAB — BPAM RBC
BLOOD PRODUCT EXPIRATION DATE: 201901162359
BLOOD PRODUCT EXPIRATION DATE: 201901162359
ISSUE DATE / TIME: 201812210831
ISSUE DATE / TIME: 201812210831
UNIT TYPE AND RH: 5100
Unit Type and Rh: 5100

## 2017-08-15 LAB — TYPE AND SCREEN
ABO/RH(D): O POS
Antibody Screen: NEGATIVE
UNIT DIVISION: 0
UNIT DIVISION: 0

## 2017-08-19 ENCOUNTER — Other Ambulatory Visit: Payer: Self-pay | Admitting: Emergency Medicine

## 2017-08-19 DIAGNOSIS — I509 Heart failure, unspecified: Secondary | ICD-10-CM

## 2017-08-19 NOTE — Telephone Encounter (Signed)
Compression stockings OK to use. Cardiology referral sent.

## 2017-08-20 ENCOUNTER — Other Ambulatory Visit: Payer: Self-pay | Admitting: *Deleted

## 2017-08-20 LAB — PROTEIN ELECTROPHORESIS, SERUM, WITH REFLEX
A/G RATIO SPE: 0.9 (ref 0.7–1.7)
ALBUMIN: 3.2 g/dL (ref 2.9–4.4)
Alpha 1: 0.3 g/dL (ref 0.0–0.4)
Alpha 2: 0.8 g/dL (ref 0.4–1.0)
Beta: 0.8 g/dL (ref 0.7–1.3)
GAMMA GLOBULIN: 1.6 g/dL (ref 0.4–1.8)
GLOBULIN, TOTAL: 3.5 g/dL (ref 2.2–3.9)
Interpretation(See Below): 0
M-SPIKE, %: 0.5 g/dL — AB
TOTAL PROTEIN: 6.7 g/dL (ref 6.0–8.5)

## 2017-08-20 NOTE — Patient Outreach (Signed)
[place] [facility]) Care Management  08/20/2017  Nathan Richards 1946/06/05 [id number]    Transition of care Interpreter Zenia Resides ID# [id number] Spanish  RN spoke through the interpreter and inquired on pt's ongoing management of care. Identifiers confirmed and RN inquired further on recent swelling as RN informed this issues has resolved with no additional swelling to his feet/legs, hands and no issues with his breathing related to his HF. Pt verified he has an understanding of HF symptoms and aware when to contact the provider with these symptoms. Pt has inquired on the referral to a CAD as RN informed pt that this request was made on the last home visit last week. Weights were provided with yesterday and today at 116 lbs and last weekend 115 lbs as pt is in the GREEN zone today with no reported symptoms.  RN will continue to encouraged the pt to elevate his bilateral legs with any swelling and report any symptoms that fall in the YELLOW zone as a prevention measure before symptoms get worse.  Plan of care discussed and interventions adjusted accordingly with much encouraged to the pt to continue managing his care with daily monitoring. RN informed pt that this RN case manager will continue with weekly transition of care calls and follow up with a visit next month as scheduled. No request or inquires at this time from this pt.   Raina Mina, RN Care Management Coordinator [place] Office [phone]

## 2017-08-21 ENCOUNTER — Encounter: Payer: Self-pay | Admitting: Hematology & Oncology

## 2017-08-24 ENCOUNTER — Other Ambulatory Visit: Payer: Self-pay | Admitting: General Surgery

## 2017-08-26 ENCOUNTER — Ambulatory Visit (HOSPITAL_COMMUNITY)
Admission: RE | Admit: 2017-08-26 | Discharge: 2017-08-26 | Disposition: A | Discharge status: Home / Self Care | Payer: Medicare Other | Source: Ambulatory Visit | Attending: Hematology & Oncology | Admitting: Hematology & Oncology

## 2017-08-26 ENCOUNTER — Encounter (HOSPITAL_COMMUNITY): Payer: Self-pay

## 2017-08-26 ENCOUNTER — Other Ambulatory Visit: Payer: Self-pay | Admitting: *Deleted

## 2017-08-26 ENCOUNTER — Other Ambulatory Visit: Payer: Self-pay | Admitting: Hematology & Oncology

## 2017-08-26 DIAGNOSIS — I129 Hypertensive chronic kidney disease with stage 1 through stage 4 chronic kidney disease, or unspecified chronic kidney disease: Secondary | ICD-10-CM | POA: Diagnosis not present

## 2017-08-26 DIAGNOSIS — Z87891 Personal history of nicotine dependence: Secondary | ICD-10-CM | POA: Insufficient documentation

## 2017-08-26 DIAGNOSIS — Z955 Presence of coronary angioplasty implant and graft: Secondary | ICD-10-CM | POA: Insufficient documentation

## 2017-08-26 DIAGNOSIS — C9110 Chronic lymphocytic leukemia of B-cell type not having achieved remission: Secondary | ICD-10-CM | POA: Insufficient documentation

## 2017-08-26 DIAGNOSIS — N189 Chronic kidney disease, unspecified: Secondary | ICD-10-CM | POA: Insufficient documentation

## 2017-08-26 HISTORY — PX: IR FLUORO GUIDE PORT INSERTION RIGHT: IMG5741

## 2017-08-26 HISTORY — PX: IR US GUIDE VASC ACCESS RIGHT: IMG2390

## 2017-08-26 LAB — CBC WITH DIFFERENTIAL/PLATELET
BASOS PCT: 0 %
Basophils Absolute: 0 10*3/uL (ref 0.0–0.1)
EOS ABS: 1.3 10*3/uL — AB (ref 0.0–0.7)
EOS PCT: 5 %
HCT: 25.3 % — ABNORMAL LOW (ref 39.0–52.0)
Hemoglobin: 8.6 g/dL — ABNORMAL LOW (ref 13.0–17.0)
LYMPHS ABS: 22.3 10*3/uL — AB (ref 0.7–4.0)
Lymphocytes Relative: 87 %
MCH: 31.4 pg (ref 26.0–34.0)
MCHC: 34 g/dL (ref 30.0–36.0)
MCV: 92.3 fL (ref 78.0–100.0)
MONO ABS: 0 10*3/uL — AB (ref 0.1–1.0)
Monocytes Relative: 0 %
Neutro Abs: 2.1 10*3/uL (ref 1.7–7.7)
Neutrophils Relative %: 8 %
PLATELETS: 107 10*3/uL — AB (ref 150–400)
RBC: 2.74 MIL/uL — ABNORMAL LOW (ref 4.22–5.81)
RDW: 15.3 % (ref 11.5–15.5)
WBC: 25.7 10*3/uL — ABNORMAL HIGH (ref 4.0–10.5)

## 2017-08-26 LAB — PROTIME-INR
INR: 1.08
Prothrombin Time: 13.9 seconds (ref 11.4–15.2)

## 2017-08-26 MED ORDER — MIDAZOLAM HCL 2 MG/2ML IJ SOLN
INTRAMUSCULAR | Status: AC | PRN
  Administered 2017-08-26 (×3): 1 mg via INTRAVENOUS

## 2017-08-26 MED ORDER — CEFAZOLIN SODIUM-DEXTROSE 2-4 GM/100ML-% IV SOLN
INTRAVENOUS | Status: AC
  Filled 2017-08-26: qty 100

## 2017-08-26 MED ORDER — FENTANYL CITRATE (PF) 100 MCG/2ML IJ SOLN
INTRAMUSCULAR | Status: AC
  Filled 2017-08-26: qty 2

## 2017-08-26 MED ORDER — LIDOCAINE-EPINEPHRINE (PF) 2 %-1:200000 IJ SOLN
INTRAMUSCULAR | Status: AC | PRN
  Administered 2017-08-26: 10 mL

## 2017-08-26 MED ORDER — CEFAZOLIN SODIUM-DEXTROSE 2-4 GM/100ML-% IV SOLN
2.0000 g | INTRAVENOUS | Status: AC
  Administered 2017-08-26: 2 g via INTRAVENOUS

## 2017-08-26 MED ORDER — LIDOCAINE HCL 1 % IJ SOLN
INTRAMUSCULAR | Status: AC | PRN
  Administered 2017-08-26: 10 mL

## 2017-08-26 MED ORDER — FENTANYL CITRATE (PF) 100 MCG/2ML IJ SOLN
INTRAMUSCULAR | Status: AC | PRN
  Administered 2017-08-26: 25 ug via INTRAVENOUS
  Administered 2017-08-26: 50 ug via INTRAVENOUS
  Administered 2017-08-26: 25 ug via INTRAVENOUS

## 2017-08-26 MED ORDER — HEPARIN SOD (PORK) LOCK FLUSH 100 UNIT/ML IV SOLN
INTRAVENOUS | Status: AC | PRN
  Administered 2017-08-26: 500 [IU]

## 2017-08-26 MED ORDER — LIDOCAINE-EPINEPHRINE (PF) 2 %-1:200000 IJ SOLN
INTRAMUSCULAR | Status: AC
  Filled 2017-08-26: qty 20

## 2017-08-26 MED ORDER — SODIUM CHLORIDE 0.9 % IV SOLN
INTRAVENOUS | Status: DC
  Administered 2017-08-26: 08:00:00 via INTRAVENOUS

## 2017-08-26 MED ORDER — MIDAZOLAM HCL 2 MG/2ML IJ SOLN
INTRAMUSCULAR | Status: AC
  Filled 2017-08-26: qty 6

## 2017-08-26 MED ORDER — HEPARIN SOD (PORK) LOCK FLUSH 100 UNIT/ML IV SOLN
INTRAVENOUS | Status: AC
  Filled 2017-08-26: qty 5

## 2017-08-26 MED ORDER — LIDOCAINE HCL 1 % IJ SOLN
INTRAMUSCULAR | Status: AC
  Filled 2017-08-26: qty 20

## 2017-08-26 NOTE — Sedation Documentation (Signed)
Patient denies pain and is resting comfortably.  

## 2017-08-26 NOTE — Sedation Documentation (Signed)
Patient is resting comfortably. 

## 2017-08-26 NOTE — Discharge Instructions (Signed)
Insercin del dispositivo de perfusin implantable: cuidados posteriores (Implanted Port Insertion, Care After) Siga estas instrucciones durante las prximas semanas. Estas indicaciones le proporcionan informacin general acerca de cmo deber cuidarse despus del procedimiento. El mdico tambin podr darle instrucciones ms especficas. El tratamiento ha sido planificado segn las prcticas mdicas actuales, pero en algunos casos pueden ocurrir problemas. Comunquese con el mdico si tiene algn problema o tiene dudas despus del procedimiento. QU ESPERAR DESPUS DEL PROCEDIMIENTO Despus del procedimiento, es comn lo siguiente:  Molestias en el sitio de la insercin del dispositivo. Colocar compresas de hielo en la zona ayudar.  Hematomas en la piel alrededor del dispositivo, los cuales disminuirn luego de 3 o 4 das. INSTRUCCIONES PARA EL CUIDADO EN EL HOGAR  Luego que le coloquen el dispositivo, le darn una tarjeta de informacin del fabricante. La tarjeta contiene informacin acerca del dispositivo. Llvela siempre con usted.  Sepa qu tipo de dispositivo de perfusin implantable tiene. Hay diferentes tipos de dispositivos de perfusin implantables.  Use un brazalete de alerta mdico en caso de emergencia. Esto ayudar a Theatre stage manager a los mdicos que usted tiene un dispositivo de perfusin implantable.  El dispositivo puede permanecer implantado por el tiempo que el mdico considere necesario.  Posiblemente un enfermero vaya a su domicilio para administrarle los medicamentos y cuidar del dispositivo.  Usted o un familiar pueden recibir instrucciones y Engineer, building services para Architectural technologist los medicamentos y cuidar del dispositivo en Engineer, mining.  SOLICITE ATENCIN MDICA SI:  El dispositivo no funciona o no puede hacer que retorne Herbalist.  Siente escalofros o fiebre.  SOLICITE ATENCIN MDICA DE INMEDIATO SI:  Observa un lquido nuevo o pus en la incisin.  Advierte un olor  ftido que proviene del lugar de la incisin.  Observa hinchazn, ms eritemas o siente dolor en el lugar de la incisin o alrededor del dispositivo.  Siente falta de aire o Tourist information centre manager.  Esta informacin no tiene Marine scientist el consejo del mdico. Asegrese de hacerle al mdico cualquier pregunta que tenga. Document Released: 06/01/2013 Document Revised: 08/16/2013 Document Reviewed: 04/18/2013 Elsevier Interactive Patient Education  2017 [place] dispositivo de perfusin implantable (Implanted Port Insertion) Un dispositivo de perfusin implantable es una va central que tiene una forma redonda y se coloca debajo de la piel. Se [place] como un acceso intravenoso a largo plazo para:  Medicamentos, como la quimioterapia.  Lquidos.  Nutricin lquida, como nutricin parenteral total (TPN, por sus siglas en ingls).  Anlisis de sangre INFORME A SU MDICO:  Alergias a alimentos o medicamentos.  Medicamentos que toma, incluidos vitaminas, hierbas, gotas oftlmicas, cremas y medicamentos de venta libre.  Cualquier tipo de alergia a Public house manager.  Uso de corticoides (por va oral o cremas).  Problemas anteriores debido a anestsicos o a medicamentos que [employer] sensibilidad.  Antecedentes de hemorragias o cogulos sanguneos.  Cirugas previas.  Otros problemas de salud, incluyendo diabetes y problemas renales.  Posibilidad de embarazo, si corresponde.  RIESGOS Y COMPLICACIONES En general, se trata de un procedimiento seguro. Sin embargo, Engineer, technical sales, pueden surgir problemas. Algunos posibles problemas incluyen:  Daos en los vasos sanguneos, hematomas o hemorragias en el lugar de la puncin.  Infeccin.  Cogulo en el vaso sanguneo en el cual est insertado el dispositivo.  Laceracin la piel alrededor del dispositivo.  En muy raras ocasiones, la persona puede presentar una afeccin llamada neumotrax, una  acumulacin de aire en el pecho que puede provocar el  colapso de [employer]. La colocacin de estos catteres mediante una gua por imgenes adecuada disminuye de [place] significativa el riesgo de sufrir un neumotrax. ANTES DEL PROCEDIMIENTO  Su mdico podra indicarle que se realice anlisis de sangre. Estos anlisis pueden ayudar a mostrar el funcionamiento de sus riones e hgado. Tambin pueden determinar la eficacia con la que coagula la [place].  Si toma anticoagulantes, pregntele al mdico cundo debe suspenderlos.  Pdale a alguna persona que lo lleve a su casa. Esto es necesario si lo han sedado para el procedimiento.  PROCEDIMIENTO La insercin del dispositivo de perfusin por lo general demora alrededor de 30 a 73minutos.  Le insertarn una aguja intravenosa en el brazo. A travs de esta aguja, recibir directamente en el organismo analgsicos y [employer] que lo ayudarn a Nurse, children's (sedantes).  Permanecer acostado en la camilla y estar conectado a monitores que registrarn su frecuencia cardaca, presin arterial y respiracin durante el procedimiento.  En su dedo se le colocar un dispositivo que controlar el oxgeno. Se le administrar oxgeno.  Durante el procedimiento, todo se mantendr tan libre de grmenes (estril) como sea posible. La piel cercana al punto de la incisin se limpiar con un antisptico, y se cubrir la zona con toallas estriles. La piel y los tejidos ms profundos alrededor de la zona del dispositivo se adormecern con anestesia local.  Se realizarn dos cortes pequeos (incisiones) en la piel para insertar el dispositivo. Uno se Public affairs consultant cuello para acceder a la vena en la cual se Hydrologist.  Debido a que el depsito del dispositivo se colocar debajo de la piel, se realizar una pequea incisin en la parte superior del pecho y se crear una cavidad pequea para el dispositivo debajo de la piel. El catter que se Clinical cytogeneticist  al dispositivo se canaliza hasta una vena central grande en el pecho. Al finalizar el procedimiento, quedar un rea pequea y prominente en el cuerpo, en el lugar donde est el depsito.  La insercin del dispositivo se realizar mediante una gua por imagen para garantizar que se haga de [place].  El depsito tiene [place] de silicona que se puede pinchar con una aguja especial.  El dispositivo se purgar con solucin salina normal, y se extraer sangre para garantizar que este funcione correctamente.  Cuando el procedimiento finaliza, no quedar nada visible [employer].  Las incisiones se mantendrn unidas con puntos, pegamento quirrgico o una cinta especial. DESPUS DEL PROCEDIMIENTO  Debe permanecer en un rea de recuperacin hasta que desaparezca el efecto de la anestesia. Controlarn su presin arterial y el pulso.  Se tomar una ltima radiografa de trax para controlar la ubicacin del dispositivo y asegurarse de que no haya lesiones en el pulmn.  Esta informacin no tiene Marine scientist el consejo del mdico. Asegrese de hacerle al mdico cualquier pregunta que tenga. Document Released: 06/01/2013 Document Revised: 09/01/2014 Document Reviewed: 04/18/2013 Elsevier Interactive Patient Education  2017 [place] consciente moderada en los adultos (Moderate Conscious Sedation, Adult) La sedacin es el uso de un medicamento para favorecer la relajacin y Runner, broadcasting/film/video y la ansiedad. La sedacin consciente moderada es un tipo de sedacin. Cuando se somete a una sedacin consciente moderada, disminuye su nivel de alerta habitual, pero igualmente puede responder a las instrucciones o al tacto, o a ambos. La sedacin consciente moderada se [place] durante procedimientos mdicos y dentales breves. Es ms leve que una sedacin profunda, que es un  tipo de sedacin de la que no es fcil despertarse. Tambin es ms leve que la anestesia general, que es el  uso de medicamentos para dejarlo inconsciente. La sedacin consciente moderada le permite volver a sus actividades habituales ms pronto. INFORME A SU MDICO:  Cualquier alergia que tenga.  Todos los [employer], incluidos vitaminas, hierbas, gotas oftlmicas, cremas y medicamentos de venta libre.  Uso de corticoides (por va oral o cremas).  Cualquier problema que usted o sus familiares hayan tenido con sedantes y anestsicos.  Enfermedades de la sangre que tenga.  Cirugas previas.  Cualquier afeccin que padezca, como apnea del sueo.  Si est embarazada o podra estarlo.  Consumo de cigarrillos, alcohol, marihuana o drogas. RIESGOS Y COMPLICACIONES En general, se trata de un procedimiento seguro. Sin embargo, pueden presentarse problemas, por ejemplo:  Recibir demasiado medicamento (sedacin excesiva).  Nuseas.  Reaccin alrgica a un medicamento.  Problemas para respirar. Si esto ocurre, es posible que se utilice un tubo respiratorio para ayudarlo a Ambulance person. El tubo se retirar cuando despierte y respire por su cuenta.  Problemas cardacos.  Problemas pulmonares. ANTES DEL PROCEDIMIENTO Mantenerse hidratado Siga las indicaciones del mdico acerca de la hidratacin, las cuales pueden incluir lo siguiente:  Hasta 2horas antes del procedimiento, puede beber lquidos transparentes, como agua, jugos frutales transparentes, caf negro y t solo. Restricciones en las comidas y bebidas [place] comidas y las bebidas, las cuales pueden incluir lo siguiente:  Ocho horas antes del procedimiento, deje de ingerir comidas o alimentos pesados, por ejemplo, carne, alimentos fritos o alimentos grasos.  Seis horas antes del procedimiento, deje de ingerir comidas o alimentos livianos, como tostadas o cereales.  Seis horas antes del procedimiento, deje de beber [place] o bebidas que [employer].  Dos horas antes del  procedimiento, deje de beber lquidos transparentes. Medicamentos Consulte al mdico si debe hacer o no lo siguiente:  Quarry manager o suspender los medicamentos que toma habitualmente. Esto es muy importante si toma medicamentos para la diabetes o anticoagulantes.  Tomar medicamentos como aspirina e ibuprofeno. Estos medicamentos pueden tener un efecto anticoagulante en la [place]. No tome estos medicamentos antes del procedimiento si el mdico le indica que no lo haga. Pruebas y exmenes  Le harn un examen fsico.  Posiblemente deba realizarse anlisis de sangre para evaluar lo siguiente: ? Cmo estn funcionando los riones y Engineer, maintenance (IT). ? Con qu eficacia coagula la sangre. Instrucciones generales  Haga planes para que una persona lo lleve a su casa desde el hospital o la clnica.  Si se ir a su casa inmediatamente despus del procedimiento, planifique que alguien se quede con usted durante 24horas. PROCEDIMIENTO  Le colocarn una va intravenosa (IV) en una de las venas.  Se le administrar un medicamento para ayudarlo a que se relaje (sedante) a travs de la va intravenosa.  Se realizar el procedimiento mdico o dental. DESPUS DEL PROCEDIMIENTO  Le controlarn con frecuencia la presin arterial, la frecuencia cardaca, la frecuencia respiratoria y Retail buyer de oxgeno en la sangre hasta que haya desaparecido el efecto de los medicamentos administrados.  No conduzca durante 24horas. Esta informacin no tiene Marine scientist el consejo del mdico. Asegrese de hacerle al mdico cualquier pregunta que tenga. Document Released: 05/21/2005 Document Revised: 09/01/2014 Document Reviewed: 12/01/2015 Elsevier Interactive Patient Education  [employer].

## 2017-08-26 NOTE — H&P (Signed)
Chief Complaint: CLL  Referring Physician:Dr. Tawanna Sat  Supervising Physician: Daryll Brod  Patient Status: Nathan Richards  HPI: Mallory Enriques is a 72 y.o. male with a history of CHF, HTN, and CLL for over 10 years.  Apparently, his CLL has been worsening recently according to the patient and he is going to begin chemotherapy starting tomorrow.  A request has been made for a PAC placement prior to initiating chemo.  The patient denies any complaints except some epigastric discomfort.  Past Medical History:  Past Medical History:  Diagnosis Date  . CHF (congestive heart failure) ([place])   . Chronic kidney disease   . CLL (chronic lymphocytic leukemia) ([place])   . Dyspnea   . History of kidney stones   . Hypertension   . Urolithiasis    cystoscopy in 2002    Past Surgical History:  Past Surgical History:  Procedure Laterality Date  . CORONARY ANGIOPLASTY WITH STENT PLACEMENT  02/05/2004   drug eluting to LAD plus baloon 1st diagonal  . INGUINAL HERNIA REPAIR     bilateral  . KNEE ARTHROSCOPY     right, done in [place]  . UMBILICAL HERNIA REPAIR      Family History:  Family History  Problem Relation Age of Onset  . Diabetes Mother   . Coronary artery disease Mother   . Coronary artery disease Father        died  . Diabetes Father   . Diabetes Brother   . Diabetes Brother        died    Social History:  reports that he quit smoking about 19 years ago. His smoking use included cigarettes. He quit after 31.00 years of use. he has never used smokeless tobacco. He reports that he does not drink alcohol or use drugs.  Allergies:  Allergies  Allergen Reactions  . Ace Inhibitors     REACTION: cough  . Amlodipine Besylate     REACTION: Impotence  . Beta Adrenergic Blockers     REACTION: Impotence  . Hydrochlorothiazide Other (See Comments)    May have contributed to his hypercalcemia    Medications: Medications reviewed in epic.  Please HPI for pertinent  positives, otherwise complete 10 system ROS negative.  Mallampati Score: MD Evaluation Airway: WNL Heart: WNL Abdomen: WNL Chest/ Lungs: WNL ASA  Classification: 3 Mallampati/Airway Score: One  Physical Exam: BP (!) 171/93   Pulse 73   Temp 97.8 F (36.6 C) (Oral)   Resp 16   Ht $R'5\' 2"'Mc$  (1.575 m)   Wt 116 lb 12.8 oz (53 kg)   SpO2 100%   BMI 21.36 kg/m  Body mass index is 21.36 kg/m. General: pleasant, WD, WN Hispanic male who is laying in bed in NAD HEENT: head is normocephalic, atraumatic.  Sclera are noninjected.  PERRL.  Ears and nose without any masses or lesions.  Mouth is pink and moist Heart: regular, rate, and rhythm.  Normal s1,s2. No obvious murmurs, gallops, or rubs noted.  Palpable radial and pedal pulses bilaterally Lungs: CTAB, no wheezes, rhonchi, or rales noted.  Respiratory effort nonlabored Abd: soft, minimal epigastric tenderness, ND, +BS, no masses, hernias, or organomegaly Psych: A&Ox3 with an appropriate affect.   Labs: Results for orders placed or performed during the hospital encounter of 08/26/17 (from the past 48 hour(s))  CBC with Differential/Platelet     Status: Abnormal   Collection Time: 08/26/17  7:57 AM  Result Value Ref Range   WBC 25.7 (H)  4.0 - 10.5 K/uL   RBC 2.74 (L) 4.22 - 5.81 MIL/uL   Hemoglobin 8.6 (L) 13.0 - 17.0 g/dL   HCT 25.3 (L) 39.0 - 52.0 %   MCV 92.3 78.0 - 100.0 fL   MCH 31.4 26.0 - 34.0 pg   MCHC 34.0 30.0 - 36.0 g/dL   RDW 15.3 11.5 - 15.5 %   Platelets 107 (L) 150 - 400 K/uL    Comment: REPEATED TO VERIFY SPECIMEN CHECKED FOR CLOTS PLATELET COUNT CONFIRMED BY SMEAR    Neutrophils Relative % 8 %   Lymphocytes Relative 87 %   Monocytes Relative 0 %   Eosinophils Relative 5 %   Basophils Relative 0 %   Neutro Abs 2.1 1.7 - 7.7 K/uL   Lymphs Abs 22.3 (H) 0.7 - 4.0 K/uL   Monocytes Absolute 0.0 (L) 0.1 - 1.0 K/uL   Eosinophils Absolute 1.3 (H) 0.0 - 0.7 K/uL   Basophils Absolute 0.0 0.0 - 0.1 K/uL   WBC  Morphology ABSOLUTE LYMPHOCYTOSIS     Comment: ATYPICAL LYMPHOCYTES  Protime-INR     Status: None   Collection Time: 08/26/17  7:57 AM  Result Value Ref Range   Prothrombin Time 13.9 11.4 - 15.2 seconds   INR 1.08     Imaging: No results found.  Assessment/Plan 1. CLL  Plan to proceed with PAC placement today.  WBC is 25K but this is likely secondary to his CLL as his WBC has been chronically elevated and he has no infectious symptoms or fevers.   Risks and benefits discussed with the patient including, but not limited to bleeding, infection, pneumothorax, or fibrin sheath development and need for additional procedures. All of the patient's questions were answered, patient is agreeable to proceed. Consent signed and in chart.  Thank you for this interesting consult.  I greatly enjoyed meeting Moussa Wiegand and look forward to participating in their care.  A copy of this report was sent to the requesting provider on this date.  Electronically Signed: Henreitta Cea 08/26/2017, 9:09 AM   I spent a total of  30 Minutes   in face to face in clinical consultation, greater than 50% of which was counseling/coordinating care for CLL

## 2017-08-26 NOTE — Procedures (Signed)
CLL  S/p RT IJ POWER PORT  TIP SVCRA NO COMP STABLE EBL 0 FULL REPORT IN PACS

## 2017-08-27 ENCOUNTER — Other Ambulatory Visit: Payer: Self-pay | Admitting: Oncology

## 2017-08-27 ENCOUNTER — Ambulatory Visit: Payer: Medicare Other

## 2017-08-27 ENCOUNTER — Telehealth: Payer: Self-pay | Admitting: *Deleted

## 2017-08-27 ENCOUNTER — Other Ambulatory Visit (HOSPITAL_BASED_OUTPATIENT_CLINIC_OR_DEPARTMENT_OTHER): Payer: Medicare Other

## 2017-08-27 ENCOUNTER — Other Ambulatory Visit: Payer: Self-pay

## 2017-08-27 ENCOUNTER — Encounter: Payer: Self-pay | Admitting: Hematology & Oncology

## 2017-08-27 ENCOUNTER — Ambulatory Visit (HOSPITAL_BASED_OUTPATIENT_CLINIC_OR_DEPARTMENT_OTHER): Payer: Medicare Other

## 2017-08-27 VITALS — BP 159/82 | HR 79 | Temp 98.1°F | Resp 18 | Wt 118.2 lb

## 2017-08-27 DIAGNOSIS — C9110 Chronic lymphocytic leukemia of B-cell type not having achieved remission: Secondary | ICD-10-CM | POA: Diagnosis not present

## 2017-08-27 DIAGNOSIS — Z5112 Encounter for antineoplastic immunotherapy: Secondary | ICD-10-CM | POA: Diagnosis not present

## 2017-08-27 DIAGNOSIS — Z5111 Encounter for antineoplastic chemotherapy: Secondary | ICD-10-CM | POA: Diagnosis not present

## 2017-08-27 LAB — CBC WITH DIFFERENTIAL (CANCER CENTER ONLY)
BASO#: 0 10*3/uL (ref 0.0–0.2)
BASO%: 0.1 % (ref 0.0–2.0)
EOS%: 5 % (ref 0.0–7.0)
Eosinophils Absolute: 1.1 10*3/uL — ABNORMAL HIGH (ref 0.0–0.5)
HEMATOCRIT: 22.2 % — AB (ref 38.7–49.9)
HGB: 7.6 g/dL — ABNORMAL LOW (ref 13.0–17.1)
LYMPH#: 18.1 10*3/uL — AB (ref 0.9–3.3)
LYMPH%: 84.5 % — ABNORMAL HIGH (ref 14.0–48.0)
MCH: 31.7 pg (ref 28.0–33.4)
MCHC: 34.2 g/dL (ref 32.0–35.9)
MCV: 93 fL (ref 82–98)
MONO#: 0.3 10*3/uL (ref 0.1–0.9)
MONO%: 1.6 % (ref 0.0–13.0)
NEUT%: 8.8 % — ABNORMAL LOW (ref 40.0–80.0)
NEUTROS ABS: 1.9 10*3/uL (ref 1.5–6.5)
Platelets: 96 10*3/uL — ABNORMAL LOW (ref 145–400)
RBC: 2.4 10*6/uL — ABNORMAL LOW (ref 4.20–5.70)
RDW: 14.9 % (ref 11.1–15.7)
WBC: 21.4 10*3/uL — ABNORMAL HIGH (ref 4.0–10.0)

## 2017-08-27 LAB — TECHNOLOGIST REVIEW CHCC SATELLITE

## 2017-08-27 LAB — CMP (CANCER CENTER ONLY)
ALBUMIN: 3 g/dL — AB (ref 3.3–5.5)
ALK PHOS: 109 U/L — AB (ref 26–84)
ALT: 15 U/L (ref 10–47)
AST: 19 U/L (ref 11–38)
BILIRUBIN TOTAL: 0.5 mg/dL (ref 0.20–1.60)
BUN, Bld: 60 mg/dL — ABNORMAL HIGH (ref 7–22)
CALCIUM: 8.4 mg/dL (ref 8.0–10.3)
CO2: 19 mEq/L (ref 18–33)
CREATININE: 3.4 mg/dL — AB (ref 0.6–1.2)
Chloride: 106 mEq/L (ref 98–108)
GLUCOSE: 113 mg/dL (ref 73–118)
Potassium: 3.7 mEq/L (ref 3.3–4.7)
SODIUM: 139 meq/L (ref 128–145)
Total Protein: 6.8 g/dL (ref 6.4–8.1)

## 2017-08-27 MED ORDER — SODIUM CHLORIDE 0.9 % IV SOLN
800.0000 mg/m2 | Freq: Once | INTRAVENOUS | Status: AC
  Administered 2017-08-27: 1240 mg via INTRAVENOUS
  Filled 2017-08-27: qty 62

## 2017-08-27 MED ORDER — ACETAMINOPHEN 325 MG PO TABS
650.0000 mg | ORAL_TABLET | Freq: Once | ORAL | Status: AC
  Administered 2017-08-27: 650 mg via ORAL

## 2017-08-27 MED ORDER — SODIUM CHLORIDE 0.9 % IV SOLN
375.0000 mg/m2 | Freq: Once | INTRAVENOUS | Status: AC
  Administered 2017-08-27: 600 mg via INTRAVENOUS
  Filled 2017-08-27: qty 50

## 2017-08-27 MED ORDER — DIPHENHYDRAMINE HCL 25 MG PO CAPS
ORAL_CAPSULE | ORAL | Status: AC
  Filled 2017-08-27: qty 2

## 2017-08-27 MED ORDER — VINCRISTINE SULFATE CHEMO INJECTION 1 MG/ML
2.0000 mg | Freq: Once | INTRAVENOUS | Status: AC
  Administered 2017-08-27: 2 mg via INTRAVENOUS
  Filled 2017-08-27: qty 2

## 2017-08-27 MED ORDER — SODIUM CHLORIDE 0.9 % IV SOLN
Freq: Once | INTRAVENOUS | Status: AC
  Administered 2017-08-27: 08:00:00 via INTRAVENOUS

## 2017-08-27 MED ORDER — HEPARIN SOD (PORK) LOCK FLUSH 100 UNIT/ML IV SOLN
500.0000 [IU] | Freq: Once | INTRAVENOUS | Status: AC | PRN
  Administered 2017-08-27: 500 [IU]
  Filled 2017-08-27: qty 5

## 2017-08-27 MED ORDER — LIDOCAINE-PRILOCAINE 2.5-2.5 % EX CREA: 1.0000 "application " | TOPICAL_CREAM | CUTANEOUS | 2 refills | Status: DC | PRN

## 2017-08-27 MED ORDER — PALONOSETRON HCL INJECTION 0.25 MG/5ML
INTRAVENOUS | Status: AC
  Filled 2017-08-27: qty 5

## 2017-08-27 MED ORDER — SODIUM CHLORIDE 0.9 % IJ SOLN
10.0000 mL | Freq: Once | INTRAMUSCULAR | Status: AC
  Administered 2017-08-27: 10 mL
  Filled 2017-08-27: qty 10

## 2017-08-27 MED ORDER — ACETAMINOPHEN 325 MG PO TABS
ORAL_TABLET | ORAL | Status: AC
  Filled 2017-08-27: qty 2

## 2017-08-27 MED ORDER — SODIUM CHLORIDE 0.9% FLUSH
10.0000 mL | INTRAVENOUS | Status: DC | PRN
  Administered 2017-08-27: 10 mL
  Filled 2017-08-27: qty 10

## 2017-08-27 MED ORDER — PROCHLORPERAZINE MALEATE 10 MG PO TABS: 10.0000 mg | ORAL_TABLET | Freq: Four times a day (QID) | ORAL | 1 refills | Status: DC | PRN

## 2017-08-27 MED ORDER — DIPHENHYDRAMINE HCL 25 MG PO CAPS
50.0000 mg | ORAL_CAPSULE | Freq: Once | ORAL | Status: AC
  Administered 2017-08-27: 50 mg via ORAL

## 2017-08-27 MED ORDER — SODIUM CHLORIDE 0.9 % IV SOLN
30.0000 mg | Freq: Once | INTRAVENOUS | Status: AC
  Administered 2017-08-27: 30 mg via INTRAVENOUS
  Filled 2017-08-27: qty 3

## 2017-08-27 MED ORDER — PALONOSETRON HCL INJECTION 0.25 MG/5ML
0.2500 mg | Freq: Once | INTRAVENOUS | Status: AC
  Administered 2017-08-27: 0.25 mg via INTRAVENOUS

## 2017-08-27 NOTE — Patient Instructions (Signed)
[facility] Discharge Instructions for Patients Receiving Chemotherapy  Today you received the following chemotherapy agents Rituxan/Vincristin/Cytoxan  To help prevent nausea and vomiting after your treatment, we encourage you to take your nausea medication as prescribed.   If you develop nausea and vomiting that is not controlled by your nausea medication, call the clinic.   BELOW ARE SYMPTOMS THAT SHOULD BE REPORTED IMMEDIATELY:  *FEVER GREATER THAN 100.5 F  *CHILLS WITH OR WITHOUT FEVER  NAUSEA AND VOMITING THAT IS NOT CONTROLLED WITH YOUR NAUSEA MEDICATION  *UNUSUAL SHORTNESS OF BREATH  *UNUSUAL BRUISING OR BLEEDING  TENDERNESS IN MOUTH AND THROAT WITH OR WITHOUT PRESENCE OF ULCERS  *URINARY PROBLEMS  *BOWEL PROBLEMS  UNUSUAL RASH Items with * indicate a potential emergency and should be followed up as soon as possible.  Feel free to call the clinic should you have any questions or concerns. The clinic phone number is [phone].  Please show the [facility] at check-in to the Emergency Department and triage nurse.   Rituximab injection (Rituxan) What is this medicine? RITUXIMAB (ri TUX i mab) is a monoclonal antibody. It is used to treat certain types of cancer like non-Hodgkin lymphoma and chronic lymphocytic leukemia. It is also used to treat rheumatoid arthritis, granulomatosis with polyangiitis (or Wegener's granulomatosis), and microscopic polyangiitis. This medicine may be used for other purposes; ask your health care provider or pharmacist if you have questions. COMMON BRAND NAME(S): Rituxan What should I tell my health care provider before I take this medicine? They need to know if you have any of these conditions: -heart disease -infection (especially a virus infection such as hepatitis B, chickenpox, cold sores, or herpes) -immune system problems -irregular heartbeat -kidney disease -lung or breathing disease, like asthma -recently  received or scheduled to receive a vaccine -an unusual or allergic reaction to rituximab, mouse proteins, other medicines, foods, dyes, or preservatives -pregnant or trying to get pregnant -breast-feeding How should I use this medicine? This medicine is for infusion into a vein. It is administered in a hospital or clinic by a specially trained health care professional. A special MedGuide will be given to you by the pharmacist with each prescription and refill. Be sure to read this information carefully each time. Talk to your pediatrician regarding the use of this medicine in children. This medicine is not approved for use in children. Overdosage: If you think you have taken too much of this medicine contact a poison control center or emergency room at once. NOTE: This medicine is only for you. Do not share this medicine with others. What if I miss a dose? It is important not to miss a dose. Call your doctor or health care professional if you are unable to keep an appointment. What may interact with this medicine? -cisplatin -other medicines for arthritis like disease modifying antirheumatic drugs or tumor necrosis factor inhibitors -live virus vaccines This list may not describe all possible interactions. Give your health care provider a list of all the medicines, herbs, non-prescription drugs, or dietary supplements you use. Also tell them if you smoke, drink alcohol, or use illegal drugs. Some items may interact with your medicine. What should I watch for while using this medicine? Your condition will be monitored carefully while you are receiving this medicine. You may need blood work done while you are taking this medicine. This medicine can cause serious allergic reactions. To reduce your risk you may need to take medicine before treatment with this medicine. Take  your medicine as directed. In some patients, this medicine may cause a serious brain infection that may cause death. If you have  any problems seeing, thinking, speaking, walking, or standing, tell your doctor right away. If you cannot reach your doctor, urgently seek other source of medical care. Call your doctor or health care professional for advice if you get a fever, chills or sore throat, or other symptoms of a cold or flu. Do not treat yourself. This drug decreases your body's ability to fight infections. Try to avoid being around people who are sick. Do not become pregnant while taking this medicine or for 12 months after stopping it. Women should inform their doctor if they wish to become pregnant or think they might be pregnant. There is a potential for serious side effects to an unborn child. Talk to your health care professional or pharmacist for more information. What side effects may I notice from receiving this medicine? Side effects that you should report to your doctor or health care professional as soon as possible: -breathing problems -chest pain -dizziness or feeling faint -fast, irregular heartbeat -low blood counts - this medicine may decrease the number of white blood cells, red blood cells and platelets. You may be at increased risk for infections and bleeding. -mouth sores -redness, blistering, peeling or loosening of the skin, including inside the mouth (this can be added for any serious or exfoliative rash that could lead to hospitalization) -signs of infection - fever or chills, cough, sore throat, pain or difficulty passing urine -signs and symptoms of kidney injury like trouble passing urine or change in the amount of urine -signs and symptoms of liver injury like dark yellow or brown urine; general ill feeling or flu-like symptoms; light-colored stools; loss of appetite; nausea; right upper belly pain; unusually weak or tired; yellowing of the eyes or skin -stomach pain -vomiting Side effects that usually do not require medical attention (report to your doctor or health care professional if they  continue or are bothersome): -headache -joint pain -muscle cramps or muscle pain This list may not describe all possible side effects. Call your doctor for medical advice about side effects. You may report side effects to FDA at 1-800-FDA-1088. Where should I keep my medicine? This drug is given in a hospital or clinic and will not be stored at home. NOTE: This sheet is a summary. It may not cover all possible information. If you have questions about this medicine, talk to your doctor, pharmacist, or health care provider.  2018 Elsevier/Gold Standard (2016-03-19 15:28:09)  Cyclophosphamide injection (Cytoxan) What is this medicine? CYCLOPHOSPHAMIDE (sye kloe FOSS fa mide) is a chemotherapy drug. It slows the growth of cancer cells. This medicine is used to treat many types of cancer like lymphoma, myeloma, leukemia, breast cancer, and ovarian cancer, to name a few. This medicine may be used for other purposes; ask your health care provider or pharmacist if you have questions. COMMON BRAND NAME(S): Cytoxan, Neosar What should I tell my health care provider before I take this medicine? They need to know if you have any of these conditions: -blood disorders -history of other chemotherapy -infection -kidney disease -liver disease -recent or ongoing radiation therapy -tumors in the bone marrow -an unusual or allergic reaction to cyclophosphamide, other chemotherapy, other medicines, foods, dyes, or preservatives -pregnant or trying to get pregnant -breast-feeding How should I use this medicine? This drug is usually given as an injection into a vein or muscle or by infusion into a  vein. It is administered in a hospital or clinic by a specially trained health care professional. Talk to your pediatrician regarding the use of this medicine in children. Special care may be needed. Overdosage: If you think you have taken too much of this medicine contact a poison control center or emergency room  at once. NOTE: This medicine is only for you. Do not share this medicine with others. What if I miss a dose? It is important not to miss your dose. Call your doctor or health care professional if you are unable to keep an appointment. What may interact with this medicine? This medicine may interact with the following medications: -amiodarone -amphotericin B -azathioprine -certain antiviral medicines for HIV or AIDS such as protease inhibitors (e.g., indinavir, ritonavir) and zidovudine -certain blood pressure medications such as benazepril, captopril, enalapril, fosinopril, lisinopril, moexipril, monopril, perindopril, quinapril, ramipril, trandolapril -certain cancer medications such as anthracyclines (e.g., daunorubicin, doxorubicin), busulfan, cytarabine, paclitaxel, pentostatin, tamoxifen, trastuzumab -certain diuretics such as chlorothiazide, chlorthalidone, hydrochlorothiazide, indapamide, metolazone -certain medicines that treat or prevent blood clots like warfarin -certain muscle relaxants such as succinylcholine -cyclosporine -etanercept -indomethacin -medicines to increase blood counts like filgrastim, pegfilgrastim, sargramostim -medicines used as general anesthesia -metronidazole -natalizumab This list may not describe all possible interactions. Give your health care provider a list of all the medicines, herbs, non-prescription drugs, or dietary supplements you use. Also tell them if you smoke, drink alcohol, or use illegal drugs. Some items may interact with your medicine. What should I watch for while using this medicine? Visit your doctor for checks on your progress. This drug may make you feel generally unwell. This is not uncommon, as chemotherapy can affect healthy cells as well as cancer cells. Report any side effects. Continue your course of treatment even though you feel ill unless your doctor tells you to stop. Drink water or other fluids as directed. Urinate often,  even at night. In some cases, you may be given additional medicines to help with side effects. Follow all directions for their use. Call your doctor or health care professional for advice if you get a fever, chills or sore throat, or other symptoms of a cold or flu. Do not treat yourself. This drug decreases your body's ability to fight infections. Try to avoid being around people who are sick. This medicine may increase your risk to bruise or bleed. Call your doctor or health care professional if you notice any unusual bleeding. Be careful brushing and flossing your teeth or using a toothpick because you may get an infection or bleed more easily. If you have any dental work done, tell your dentist you are receiving this medicine. You may get drowsy or dizzy. Do not drive, use machinery, or do anything that needs mental alertness until you know how this medicine affects you. Do not become pregnant while taking this medicine or for 1 year after stopping it. Women should inform their doctor if they wish to become pregnant or think they might be pregnant. Men should not father a child while taking this medicine and for 4 months after stopping it. There is a potential for serious side effects to an unborn child. Talk to your health care professional or pharmacist for more information. Do not breast-feed an infant while taking this medicine. This medicine may interfere with the ability to have a child. This medicine has caused ovarian failure in some women. This medicine has caused reduced sperm counts in some men. You should talk with your doctor or  health care professional if you are concerned about your fertility. If you are going to have surgery, tell your doctor or health care professional that you have taken this medicine. What side effects may I notice from receiving this medicine? Side effects that you should report to your doctor or health care professional as soon as possible: -allergic reactions  like skin rash, itching or hives, swelling of the face, lips, or tongue -low blood counts - this medicine may decrease the number of white blood cells, red blood cells and platelets. You may be at increased risk for infections and bleeding. -signs of infection - fever or chills, cough, sore throat, pain or difficulty passing urine -signs of decreased platelets or bleeding - bruising, pinpoint red spots on the skin, black, tarry stools, blood in the urine -signs of decreased red blood cells - unusually weak or tired, fainting spells, lightheadedness -breathing problems -dark urine -dizziness -palpitations -swelling of the ankles, feet, hands -trouble passing urine or change in the amount of urine -weight gain -yellowing of the eyes or skin Side effects that usually do not require medical attention (report to your doctor or health care professional if they continue or are bothersome): -changes in nail or skin color -hair loss -missed menstrual periods -mouth sores -nausea, vomiting This list may not describe all possible side effects. Call your doctor for medical advice about side effects. You may report side effects to FDA at 1-800-FDA-1088. Where should I keep my medicine? This drug is given in a hospital or clinic and will not be stored at home. NOTE: This sheet is a summary. It may not cover all possible information. If you have questions about this medicine, talk to your doctor, pharmacist, or health care provider.  2018 Elsevier/Gold Standard (2012-06-25 16:22:58)  Vincristine injection What is this medicine? VINCRISTINE (vin KRIS teen) is a chemotherapy drug. It slows the growth of cancer cells. This medicine is used to treat many types of cancer like Hodgkin's disease, leukemia, non-Hodgkin's lymphoma, neuroblastoma (brain cancer), rhabdomyosarcoma, and Wilms' tumor. This medicine may be used for other purposes; ask your health care provider or pharmacist if you have  questions. COMMON BRAND NAME(S): Oncovin, Vincasar PFS What should I tell my health care provider before I take this medicine? They need to know if you have any of these conditions: -blood disorders -gout -infection (especially chickenpox, cold sores, or herpes) -kidney disease -liver disease -lung disease -nervous system disease like Charcot-Marie-Tooth (CMT) -recent or ongoing radiation therapy -an unusual or allergic reaction to vincristine, other chemotherapy agents, other medicines, foods, dyes, or preservatives -pregnant or trying to get pregnant -breast-feeding How should I use this medicine? This drug is given as an infusion into a vein. It is administered in a hospital or clinic by a specially trained health care professional. If you have pain, swelling, burning, or any unusual feeling around the site of your injection, tell your health care professional right away. Talk to your pediatrician regarding the use of this medicine in children. While this drug may be prescribed for selected conditions, precautions do apply. Overdosage: If you think you have taken too much of this medicine contact a poison control center or emergency room at once. NOTE: This medicine is only for you. Do not share this medicine with others. What if I miss a dose? It is important not to miss your dose. Call your doctor or health care professional if you are unable to keep an appointment. What may interact with this medicine? Do not  take this medicine with any of the following medications: -itraconazole -mibefradil -voriconazole This medicine may also interact with the following medications: -cyclosporine -erythromycin -fluconazole -ketoconazole -medicines for HIV like delavirdine, efavirenz, nevirapine -medicines for seizures like ethotoin, fosphenotoin, phenytoin -medicines to increase blood counts like filgrastim, pegfilgrastim, sargramostim -other chemotherapy drugs like cisplatin,  L-asparaginase, methotrexate, mitomycin, paclitaxel -pegaspargase -vaccines -zalcitabine, ddC Talk to your doctor or health care professional before taking any of these medicines: -acetaminophen -aspirin -ibuprofen -ketoprofen -naproxen This list may not describe all possible interactions. Give your health care provider a list of all the medicines, herbs, non-prescription drugs, or dietary supplements you use. Also tell them if you smoke, drink alcohol, or use illegal drugs. Some items may interact with your medicine. What should I watch for while using this medicine? Your condition will be monitored carefully while you are receiving this medicine. You will need important blood work done while you are taking this medicine. This drug may make you feel generally unwell. This is not uncommon, as chemotherapy can affect healthy cells as well as cancer cells. Report any side effects. Continue your course of treatment even though you feel ill unless your doctor tells you to stop. In some cases, you may be given additional medicines to help with side effects. Follow all directions for their use. Call your doctor or health care professional for advice if you get a fever, chills or sore throat, or other symptoms of a cold or flu. Do not treat yourself. Avoid taking products that contain aspirin, acetaminophen, ibuprofen, naproxen, or ketoprofen unless instructed by your doctor. These medicines may hide a fever. Do not become pregnant while taking this medicine. Women should inform their doctor if they wish to become pregnant or think they might be pregnant. There is a potential for serious side effects to an unborn child. Talk to your health care professional or pharmacist for more information. Do not breast-feed an infant while taking this medicine. Men may have a lower sperm count while taking this medicine. Talk to your doctor if you plan to father a child. What side effects may I notice from receiving  this medicine? Side effects that you should report to your doctor or health care professional as soon as possible: -allergic reactions like skin rash, itching or hives, swelling of the face, lips, or tongue -breathing problems -confusion or changes in emotions or moods -constipation -cough -mouth sores -muscle weakness -nausea and vomiting -pain, swelling, redness or irritation at the injection site -pain, tingling, numbness in the hands or feet -problems with balance, talking, walking -seizures -stomach pain -trouble passing urine or change in the amount of urine Side effects that usually do not require medical attention (report to your doctor or health care professional if they continue or are bothersome): -diarrhea -hair loss -jaw pain -loss of appetite This list may not describe all possible side effects. Call your doctor for medical advice about side effects. You may report side effects to FDA at 1-800-FDA-1088. Where should I keep my medicine? This drug is given in a hospital or clinic and will not be stored at home. NOTE: This sheet is a summary. It may not cover all possible information. If you have questions about this medicine, talk to your doctor, pharmacist, or health care provider.  2018 Elsevier/Gold Standard (2008-05-08 17:17:13)

## 2017-08-27 NOTE — Patient Instructions (Signed)
Implanted Port Home Guide An implanted port is a type of central line that is placed under the skin. Central lines are used to provide IV access when treatment or nutrition needs to be given through a person's veins. Implanted ports are used for long-term IV access. An implanted port may be placed because:  You need IV medicine that would be irritating to the small veins in your hands or arms.  You need long-term IV medicines, such as antibiotics.  You need IV nutrition for a long period.  You need frequent blood draws for lab tests.  You need dialysis.  Implanted ports are usually placed in the chest area, but they can also be placed in the upper arm, the abdomen, or the leg. An implanted port has two main parts:  Reservoir. The reservoir is round and will appear as a small, raised area under your skin. The reservoir is the part where a needle is inserted to give medicines or draw blood.  Catheter. The catheter is a thin, flexible tube that extends from the reservoir. The catheter is placed into a large vein. Medicine that is inserted into the reservoir goes into the catheter and then into the vein.  How will I care for my incision site? Do not get the incision site wet. Bathe or shower as directed by your health care provider. How is my port accessed? Special steps must be taken to access the port:  Before the port is accessed, a numbing cream can be placed on the skin. This helps numb the skin over the port site.  Your health care provider uses a sterile technique to access the port. ? Your health care provider must put on a mask and sterile gloves. ? The skin over your port is cleaned carefully with an antiseptic and allowed to dry. ? The port is gently pinched between sterile gloves, and a needle is inserted into the port.  Only "non-coring" port needles should be used to access the port. Once the port is accessed, a blood return should be checked. This helps ensure that the port  is in the vein and is not clogged.  If your port needs to remain accessed for a constant infusion, a clear (transparent) bandage will be placed over the needle site. The bandage and needle will need to be changed every week, or as directed by your health care provider.  Keep the bandage covering the needle clean and dry. Do not get it wet. Follow your health care provider's instructions on how to take a shower or bath while the port is accessed.  If your port does not need to stay accessed, no bandage is needed over the port.  What is flushing? Flushing helps keep the port from getting clogged. Follow your health care provider's instructions on how and when to flush the port. Ports are usually flushed with saline solution or a medicine called heparin. The need for flushing will depend on how the port is used.  If the port is used for intermittent medicines or blood draws, the port will need to be flushed: ? After medicines have been given. ? After blood has been drawn. ? As part of routine maintenance.  If a constant infusion is running, the port may not need to be flushed.  How long will my port stay implanted? The port can stay in for as long as your health care provider thinks it is needed. When it is time for the port to come out, surgery will be   done to remove it. The procedure is similar to the one performed when the port was put in. When should I seek immediate medical care? When you have an implanted port, you should seek immediate medical care if:  You notice a bad smell coming from the incision site.  You have swelling, redness, or drainage at the incision site.  You have more swelling or pain at the port site or the surrounding area.  You have a fever that is not controlled with medicine.  This information is not intended to replace advice given to you by your health care provider. Make sure you discuss any questions you have with your health care provider. Document  Released: 08/11/2005 Document Revised: 01/17/2016 Document Reviewed: 04/18/2013 Elsevier Interactive Patient Education  2017 Elsevier Inc.  

## 2017-08-27 NOTE — Telephone Encounter (Signed)
Critical Value Creatine 3.4 Dr Marin Olp notified. No orders at this time

## 2017-08-27 NOTE — Progress Notes (Signed)
Ok to treat with Hgb 7.6, Platelets 96, and Creat 3.4 per Dr. Marin Olp.  Patient tolerated first treatment ok without complications during the infusion. Patient discharged ambulatory.

## 2017-08-28 ENCOUNTER — Other Ambulatory Visit: Payer: Self-pay | Admitting: *Deleted

## 2017-08-28 ENCOUNTER — Telehealth: Payer: Self-pay

## 2017-08-28 LAB — CYTOGENETICS, PERIPHERAL BLOOD

## 2017-08-28 NOTE — Telephone Encounter (Signed)
Chemo follow up, patient feels well.

## 2017-08-28 NOTE — Telephone Encounter (Signed)
Spoke with patient he is doing much better.  His appointment has been scheduled for cardiology

## 2017-08-28 NOTE — Patient Outreach (Signed)
[facility]) Care Management  08/28/2017  Thane Age 11/07/1945 [id number]   Transition of care contact [place] ID# [id number]  RN spoke with pt through the interpreter today and inquired on pt's management of care related to his HF. Pt reports his weight 5 days ago 114 lbs and yesterday and today both reads were 116 lbs. Pt denies any swelling and no problems with his breathing with no coughing or related symptoms. RN reiterated on the HF zones and verified pt remains in the GREEN zone. Reviewed pt's plan of care, goals and adjusted interventions accordingly. Also verified provider's office as outreach and made the referral for an appointments to a CAD provider (pt confirmed).  Continue to encourage adherence and reminded pt of the next home visit appointments later in this month.  Raina Mina, RN Care Management Coordinator [place] Office [phone]

## 2017-09-10 LAB — TISSUE HYBRIDIZATION TO NCBH

## 2017-09-10 LAB — CHROMOSOME ANALYSIS, PERIPHERAL BLOOD

## 2017-09-16 ENCOUNTER — Other Ambulatory Visit: Payer: Self-pay | Admitting: *Deleted

## 2017-09-16 NOTE — Patient Outreach (Addendum)
[facility]) Care Management   09/16/2017  Nathan Richards [id number]  Nathan Richards is an 72 y.o. male  Subjective:  HF: Pt states he has not been weighing over the last two days but has been over the last month. Pt denies any symptoms other then some swelling to his lower legs but no other issues reported. Pt reports he has been taking all his medications with no delays and has enough medications. Pt receptive to the review of HF symptoms but does not remember all that was taught on the last home visit.  HTN: Reports recent visit to his new CAD provider and provided a new prescription Metoprolol for his BP. Pt states he does not know how to read a BP device and unaware when his BP is elevated.  SKIN: Pt complains of dry skin to his thighs and back area due to the medication he is taking. Pt inquired on OTC product that may help. LOC: Pt inquired on the different levels of care.  Objective:   Review of Systems  Constitutional: Negative.   HENT: Negative.   Eyes: Negative.   Respiratory: Negative.   Cardiovascular: Positive for leg swelling.       Bilateral edema to lower legs  Gastrointestinal: Negative.   Genitourinary: Negative.   Musculoskeletal: Negative.   Skin: Positive for itching.       Pt complains of itching to his back and thigh (dry skin)  Neurological: Negative.   Endo/Heme/Allergies: Negative.   Psychiatric/Behavioral: Negative.     Physical Exam  Constitutional: He is oriented to person, place, and time. He appears well-developed and well-nourished.  HENT:  Right Ear: External ear normal.  Left Ear: External ear normal.  Eyes: EOM are normal.  Neck: Normal range of motion.  Cardiovascular: Normal heart sounds.  Respiratory: Effort normal.  GI: Soft. Bowel sounds are normal.  Musculoskeletal: Normal range of motion.  Neurological: He is alert and oriented to person, place, and time.  Skin: Skin is warm and dry.  Psychiatric: He has a  normal mood and affect. His behavior is normal. Judgment and thought content normal.    Encounter Medications:   Outpatient Encounter Medications as of 09/16/2017  Medication Sig  . furosemide (LASIX) 40 MG tablet Take 1.5 tablets (60 mg total) by mouth 2 (two) times daily.  . isosorbide-hydrALAZINE (BIDIL) 20-37.5 MG tablet Take 1 tablet by mouth 3 (three) times daily.  Marland Kitchen lidocaine-prilocaine (EMLA) cream Apply 1 application topically as needed.  . metoprolol succinate (TOPROL-XL) 50 MG 24 hr tablet Take 50 mg by mouth daily. Take with or immediately following a meal.  . prochlorperazine (COMPAZINE) 10 MG tablet Take 1 tablet (10 mg total) by mouth every 6 (six) hours as needed for nausea or vomiting.   No facility-administered encounter medications on file as of 09/16/2017.     Functional Status:   In your present state of health, do you have any difficulty performing the following activities: 08/26/2017 08/07/2017  Hearing? N N  Vision? N N  Difficulty concentrating or making decisions? N Y  Walking or climbing stairs? N Y  Dressing or bathing? N N  Doing errands, shopping? - Y  Conservation officer, nature and eating ? - N  Using the Toilet? - N  In the past six months, have you accidently leaked urine? - N  Do you have problems with loss of bowel control? - N  Managing your Medications? - N  Managing your Finances? - N  Housekeeping or  managing your Housekeeping? - N  Some recent data might be hidden    Fall/Depression Screening:    Fall Risk  08/07/2017 07/29/2017 07/28/2017  Falls in the past year? Yes Yes Yes  Number falls in past yr: $Remove'1 1 1  'CZubRKh$ Injury with Fall? No - No  Risk for fall due to : History of fall(s) - History of fall(s)  Follow up Falls prevention discussed - Falls evaluation completed   PHQ 2/9 Scores 08/07/2017 07/29/2017 07/28/2017 07/22/2017 06/24/2017 01/28/2017 01/27/2017  PHQ - 2 Score 0 0 0 0 0 0 0  PHQ- 9 Score - - - - - - -   BP (!) 170/80 (BP Location: Left Arm,  Patient Position: Sitting, Cuff Size: Normal)   Resp 20   Ht 1.575 m ($Remove'5\' 2"'qanbkjN$ )   Wt 123 lb 3.2 oz (55.9 kg)   BMI 22.53 kg/m  Assessment:   Ongoing case management related to HF Hypertension related to elevated BP Skin issues related to dry skin LOC related assisted living and independently living Plan:  Will verify pt continue adherence with monitoring his HF. Will reiterated on the HF zones and verify pt's zone today. Pt has bilateral swelling to his lower legs (edema noted 2+) with no other symptoms reported today. Will strongly encouraged pt to continue to elevate his bilateral legs to prevent ongoing swelling.  Will strongly encouraged pt to weight daily along with documenting all readings and call his provided with the rule of 3 lbs overnight or 5 lbs within one week with any precipitating symptoms (pt with a clear understanding). Will contact pt's CAD (Dr. Georgina Peer) provider to reports findings for today's assessment. Note the following BP elevated left 170/80 and right 172/82 with no encountered symptoms of distress. WIll educate on the new medication Metoprolol and the importance of why pt should take this medication for his HTN to lower his BP.  Will also reported the fluid retention (2+) over the last week and the possibility of TEDS is needed or ordered by this provider. RN also offered to visit pt sooner for any other interventions if need prior to the scheduled next home visit in Feb. Spoke with Edwena Blow and reported all findings and requested to interventions to be called with a Spanish interpreter.  Will discuss pt's dry skin and to lubricate skin with Vaseline also inquired on itchy skin. Will discuss OTC (hydrocortison and Antibiotic ointment if needed however if this does not resolve pt's skin issues requested pt to contact his provider for possible prescription medications.  Will discussed the different level of care related to independent and assisted living facilities if pt interested  in the future for possible placement. Pt aware [facility] can assist with this if pt wishes to consider this change.  Will review the plan of care along with his goals and interventions adjusted accordingly. Will schedule another follow up appointments next month accordingly.  Raina Mina, RN Care Management Coordinator [place] Office [phone]

## 2017-09-16 NOTE — Patient Outreach (Signed)
[facility]) Care Management  09/16/2017  Sundeep Destin August 10, 1946 [id number]    [place] interpreter used Janett Billow ID# [phone]  RN contacted pt today to verify to scheduled home visit today. Pt has indicated he may not be available due to a pending move from this address. RN confirmed pt would be available for the appointment today. Will complete a home visit for ongoing case management services for HF and provided [facility] calendar as discussed on the last conversation. No other request at this time.   Raina Mina, RN Care Management Coordinator [place] Office [phone]

## 2017-09-17 ENCOUNTER — Inpatient Hospital Stay: Payer: Medicare Other | Attending: Hematology & Oncology | Admitting: Hematology & Oncology

## 2017-09-17 ENCOUNTER — Inpatient Hospital Stay: Payer: Medicare Other

## 2017-09-17 ENCOUNTER — Other Ambulatory Visit: Payer: Self-pay | Admitting: Oncology

## 2017-09-17 ENCOUNTER — Other Ambulatory Visit: Payer: Self-pay | Admitting: Family

## 2017-09-17 ENCOUNTER — Other Ambulatory Visit: Payer: Self-pay | Admitting: *Deleted

## 2017-09-17 ENCOUNTER — Other Ambulatory Visit: Payer: Self-pay

## 2017-09-17 ENCOUNTER — Encounter: Payer: Self-pay | Admitting: Hematology & Oncology

## 2017-09-17 ENCOUNTER — Telehealth: Payer: Self-pay | Admitting: *Deleted

## 2017-09-17 VITALS — BP 156/74 | HR 74 | Temp 98.1°F | Resp 18

## 2017-09-17 VITALS — BP 172/86 | HR 76 | Temp 98.3°F | Resp 18 | Wt 120.2 lb

## 2017-09-17 DIAGNOSIS — D649 Anemia, unspecified: Secondary | ICD-10-CM

## 2017-09-17 DIAGNOSIS — C9110 Chronic lymphocytic leukemia of B-cell type not having achieved remission: Secondary | ICD-10-CM

## 2017-09-17 DIAGNOSIS — N289 Disorder of kidney and ureter, unspecified: Secondary | ICD-10-CM | POA: Diagnosis not present

## 2017-09-17 DIAGNOSIS — R531 Weakness: Secondary | ICD-10-CM | POA: Diagnosis not present

## 2017-09-17 DIAGNOSIS — Z79899 Other long term (current) drug therapy: Secondary | ICD-10-CM

## 2017-09-17 DIAGNOSIS — M7989 Other specified soft tissue disorders: Secondary | ICD-10-CM | POA: Diagnosis not present

## 2017-09-17 LAB — CBC WITH DIFFERENTIAL (CANCER CENTER ONLY)
BASOS ABS: 0 10*3/uL (ref 0.0–0.1)
BASOS PCT: 0 %
EOS PCT: 6 %
Eosinophils Absolute: 0.7 10*3/uL — ABNORMAL HIGH (ref 0.0–0.5)
HCT: 19.9 % — ABNORMAL LOW (ref 38.7–49.9)
Hemoglobin: 6.6 g/dL — CL (ref 13.0–17.1)
LYMPHS PCT: 73 %
Lymphs Abs: 7.4 10*3/uL — ABNORMAL HIGH (ref 0.9–3.3)
MCH: 31.3 pg (ref 28.0–33.4)
MCHC: 33.2 g/dL (ref 32.0–35.9)
MCV: 94.3 fL (ref 82.0–98.0)
Monocytes Absolute: 0.2 10*3/uL (ref 0.1–0.9)
Monocytes Relative: 2 %
Neutro Abs: 2 10*3/uL (ref 1.5–6.5)
Neutrophils Relative %: 19 %
PLATELETS: 142 10*3/uL (ref 140–400)
RBC: 2.11 MIL/uL — AB (ref 4.20–5.70)
RDW: 14.8 % (ref 11.1–15.7)
WBC: 10.3 10*3/uL (ref 4.0–10.3)

## 2017-09-17 LAB — ABO/RH: ABO/RH(D): O POS

## 2017-09-17 LAB — CMP (CANCER CENTER ONLY)
ALBUMIN: 2.8 g/dL — AB (ref 3.5–5.0)
ALT: 13 U/L (ref 0–55)
AST: 17 U/L (ref 5–34)
Alkaline Phosphatase: 114 U/L — ABNORMAL HIGH (ref 26–84)
Anion gap: 8 (ref 5–15)
BUN: 56 mg/dL — AB (ref 7–22)
CALCIUM: 8.5 mg/dL (ref 8.0–10.3)
CO2: 23 mmol/L (ref 18–33)
CREATININE: 4.1 mg/dL — AB (ref 0.70–1.30)
Chloride: 110 mmol/L — ABNORMAL HIGH (ref 98–108)
GLUCOSE: 113 mg/dL (ref 70–118)
Potassium: 3.3 mmol/L (ref 3.3–4.7)
SODIUM: 141 mmol/L (ref 128–145)
TOTAL PROTEIN: 6.5 g/dL (ref 6.4–8.1)
Total Bilirubin: 0.5 mg/dL (ref 0.2–1.2)

## 2017-09-17 LAB — PREPARE RBC (CROSSMATCH)

## 2017-09-17 LAB — SAMPLE TO BLOOD BANK

## 2017-09-17 LAB — LACTATE DEHYDROGENASE: LDH: 184 U/L (ref 125–245)

## 2017-09-17 MED ORDER — ACETAMINOPHEN 325 MG PO TABS
ORAL_TABLET | ORAL | Status: AC
  Filled 2017-09-17: qty 2

## 2017-09-17 MED ORDER — DIPHENHYDRAMINE HCL 25 MG PO CAPS
ORAL_CAPSULE | ORAL | Status: AC
  Filled 2017-09-17: qty 2

## 2017-09-17 MED ORDER — SODIUM CHLORIDE 0.9 % IV SOLN
Freq: Once | INTRAVENOUS | Status: AC
  Administered 2017-09-17: 10:00:00 via INTRAVENOUS

## 2017-09-17 MED ORDER — PALONOSETRON HCL INJECTION 0.25 MG/5ML
INTRAVENOUS | Status: AC
  Filled 2017-09-17: qty 5

## 2017-09-17 MED ORDER — LORAZEPAM 0.5 MG PO TABS: ORAL_TABLET | ORAL | 0 refills | Status: DC

## 2017-09-17 MED ORDER — SODIUM CHLORIDE 0.9 % IV SOLN
800.0000 mg/m2 | Freq: Once | INTRAVENOUS | Status: AC
  Administered 2017-09-17: 1240 mg via INTRAVENOUS
  Filled 2017-09-17: qty 62

## 2017-09-17 MED ORDER — ACETAMINOPHEN 325 MG PO TABS
650.0000 mg | ORAL_TABLET | Freq: Once | ORAL | Status: AC
  Administered 2017-09-17: 650 mg via ORAL

## 2017-09-17 MED ORDER — SODIUM CHLORIDE 0.9 % IJ SOLN
10.0000 mL | Freq: Once | INTRAMUSCULAR | Status: AC
  Administered 2017-09-17: 10 mL
  Filled 2017-09-17: qty 10

## 2017-09-17 MED ORDER — HEPARIN SOD (PORK) LOCK FLUSH 100 UNIT/ML IV SOLN
500.0000 [IU] | Freq: Once | INTRAVENOUS | Status: AC | PRN
  Administered 2017-09-17: 500 [IU]
  Filled 2017-09-17: qty 5

## 2017-09-17 MED ORDER — VINCRISTINE SULFATE CHEMO INJECTION 1 MG/ML
2.0000 mg | Freq: Once | INTRAVENOUS | Status: AC
  Administered 2017-09-17: 2 mg via INTRAVENOUS
  Filled 2017-09-17: qty 2

## 2017-09-17 MED ORDER — PALONOSETRON HCL INJECTION 0.25 MG/5ML
0.2500 mg | Freq: Once | INTRAVENOUS | Status: AC
  Administered 2017-09-17: 0.25 mg via INTRAVENOUS

## 2017-09-17 MED ORDER — SODIUM CHLORIDE 0.9 % IV SOLN
375.0000 mg/m2 | Freq: Once | INTRAVENOUS | Status: AC
  Administered 2017-09-17: 600 mg via INTRAVENOUS
  Filled 2017-09-17: qty 50

## 2017-09-17 MED ORDER — DIPHENHYDRAMINE HCL 25 MG PO CAPS
50.0000 mg | ORAL_CAPSULE | Freq: Once | ORAL | Status: AC
  Administered 2017-09-17: 50 mg via ORAL

## 2017-09-17 MED ORDER — SODIUM CHLORIDE 0.9% FLUSH
10.0000 mL | INTRAVENOUS | Status: DC | PRN
  Administered 2017-09-17: 10 mL
  Filled 2017-09-17: qty 10

## 2017-09-17 MED ORDER — ONDANSETRON HCL 4 MG PO TABS: ORAL_TABLET | ORAL | 2 refills | Status: DC

## 2017-09-17 MED ORDER — SODIUM CHLORIDE 0.9 % IV SOLN
30.0000 mg | Freq: Once | INTRAVENOUS | Status: AC
  Administered 2017-09-17: 30 mg via INTRAVENOUS
  Filled 2017-09-17: qty 3

## 2017-09-17 NOTE — Progress Notes (Signed)
Ok to treat with Hgb 6.6 and creatinine 4.1 per Dr. Marin Olp.

## 2017-09-17 NOTE — Patient Instructions (Signed)
Implanted Port Home Guide An implanted port is a type of central line that is placed under the skin. Central lines are used to provide IV access when treatment or nutrition needs to be given through a person's veins. Implanted ports are used for long-term IV access. An implanted port may be placed because:  You need IV medicine that would be irritating to the small veins in your hands or arms.  You need long-term IV medicines, such as antibiotics.  You need IV nutrition for a long period.  You need frequent blood draws for lab tests.  You need dialysis.  Implanted ports are usually placed in the chest area, but they can also be placed in the upper arm, the abdomen, or the leg. An implanted port has two main parts:  Reservoir. The reservoir is round and will appear as a small, raised area under your skin. The reservoir is the part where a needle is inserted to give medicines or draw blood.  Catheter. The catheter is a thin, flexible tube that extends from the reservoir. The catheter is placed into a large vein. Medicine that is inserted into the reservoir goes into the catheter and then into the vein.  How will I care for my incision site? Do not get the incision site wet. Bathe or shower as directed by your health care provider. How is my port accessed? Special steps must be taken to access the port:  Before the port is accessed, a numbing cream can be placed on the skin. This helps numb the skin over the port site.  Your health care provider uses a sterile technique to access the port. ? Your health care provider must put on a mask and sterile gloves. ? The skin over your port is cleaned carefully with an antiseptic and allowed to dry. ? The port is gently pinched between sterile gloves, and a needle is inserted into the port.  Only "non-coring" port needles should be used to access the port. Once the port is accessed, a blood return should be checked. This helps ensure that the port  is in the vein and is not clogged.  If your port needs to remain accessed for a constant infusion, a clear (transparent) bandage will be placed over the needle site. The bandage and needle will need to be changed every week, or as directed by your health care provider.  Keep the bandage covering the needle clean and dry. Do not get it wet. Follow your health care provider's instructions on how to take a shower or bath while the port is accessed.  If your port does not need to stay accessed, no bandage is needed over the port.  What is flushing? Flushing helps keep the port from getting clogged. Follow your health care provider's instructions on how and when to flush the port. Ports are usually flushed with saline solution or a medicine called heparin. The need for flushing will depend on how the port is used.  If the port is used for intermittent medicines or blood draws, the port will need to be flushed: ? After medicines have been given. ? After blood has been drawn. ? As part of routine maintenance.  If a constant infusion is running, the port may not need to be flushed.  How long will my port stay implanted? The port can stay in for as long as your health care provider thinks it is needed. When it is time for the port to come out, surgery will be   done to remove it. The procedure is similar to the one performed when the port was put in. When should I seek immediate medical care? When you have an implanted port, you should seek immediate medical care if:  You notice a bad smell coming from the incision site.  You have swelling, redness, or drainage at the incision site.  You have more swelling or pain at the port site or the surrounding area.  You have a fever that is not controlled with medicine.  This information is not intended to replace advice given to you by your health care provider. Make sure you discuss any questions you have with your health care provider. Document  Released: 08/11/2005 Document Revised: 01/17/2016 Document Reviewed: 04/18/2013 Elsevier Interactive Patient Education  2017 Elsevier Inc.  

## 2017-09-17 NOTE — Patient Instructions (Signed)
[place] Discharge Instructions for Patients Receiving Chemotherapy  Today you received the following chemotherapy agents Rituxan/Vincristine/Cytoxan To help prevent nausea and vomiting after your treatment, we encourage you to take your nausea medication as prescribed.   If you develop nausea and vomiting that is not controlled by your nausea medication, call the clinic.   BELOW ARE SYMPTOMS THAT SHOULD BE REPORTED IMMEDIATELY:  *FEVER GREATER THAN 100.5 F  *CHILLS WITH OR WITHOUT FEVER  NAUSEA AND VOMITING THAT IS NOT CONTROLLED WITH YOUR NAUSEA MEDICATION  *UNUSUAL SHORTNESS OF BREATH  *UNUSUAL BRUISING OR BLEEDING  TENDERNESS IN MOUTH AND THROAT WITH OR WITHOUT PRESENCE OF ULCERS  *URINARY PROBLEMS  *BOWEL PROBLEMS  UNUSUAL RASH Items with * indicate a potential emergency and should be followed up as soon as possible.  Feel free to call the clinic should you have any questions or concerns. The clinic phone number is [phone].  Please show the [place] at check-in to the Emergency Department and triage nurse.

## 2017-09-17 NOTE — Progress Notes (Signed)
Hematology and Oncology Follow Up Visit  Nathan Richards [id number] [birth year] 72 y.o. 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  We finally got him on treatment.  He got treatment 3 weeks ago.  He said he had vomiting and diarrhea afterwards.  I think he was just on Compazine.  We will have to see about giving him some Zofran and Decadron to see if this does not help a little bit.  He has had some swelling in his legs.  His hemoglobin is only 6.6 today.  He will need to be transfused.  I am sure that his renal function is causing his anemia to not improve.  I will have to check an erythropoietin level on him.  I thought we had ordered one before but had not.  He has had no obvious bleeding.  He does feel weak.  He has had no problems urinating.  He says he is taking his blood pressure medication.  He has had no rashes.  He has not noted any swollen lymph nodes.  Overall, I would say that his performance status is ECOG 2.  Medications:  Current Outpatient Medications:  .  furosemide (LASIX) 40 MG tablet, Take 1.5 tablets (60 mg total) by mouth 2 (two) times daily., Disp: 60 tablet, Rfl: 2 .  isosorbide-hydrALAZINE (BIDIL) 20-37.5 MG tablet, Take 1 tablet by mouth 3 (three) times daily., Disp: 90 tablet, Rfl: 2 .  lidocaine-prilocaine (EMLA) cream, Apply 1 application topically as needed., Disp: 30 g, Rfl: 2 .  metoprolol succinate (TOPROL-XL) 50 MG 24 hr tablet, Take 50 mg by mouth daily. Take with or immediately following a meal., Disp: , Rfl:  .  prochlorperazine (COMPAZINE) 10 MG tablet, Take 1 tablet (10 mg total) by mouth every 6 (six) hours as needed for nausea or vomiting., Disp: 30 tablet, Rfl: 1  Allergies:  Allergies  Allergen Reactions  . Ace Inhibitors     REACTION: cough  . Amlodipine Besylate     REACTION:  Impotence  . Beta Adrenergic Blockers     REACTION: Impotence  . Hydrochlorothiazide Other (See Comments)    May have contributed to his hypercalcemia    Past Medical History, Surgical history, Social history, and Family History were reviewed and updated.  Review of Systems: Review of Systems  Constitutional: Positive for malaise/fatigue.  HENT: Negative.   Eyes: Negative.   Respiratory: Negative.   Cardiovascular: Positive for leg swelling.  Gastrointestinal: Positive for vomiting.  Genitourinary: Negative.   Musculoskeletal: Negative.   Skin: Negative.   Neurological: Negative.   Endo/Heme/Allergies: Negative.   Psychiatric/Behavioral: Negative.      Wt Readings from Last 3 Encounters:  09/17/17 120 lb 4 oz (54.5 kg)  09/16/17 123 lb 3.2 oz (55.9 kg)  08/27/17 118 lb 4 oz (53.6 kg)     Physical Exam  Constitutional: He is oriented to person, place, and time.  HENT:  Head: Normocephalic and atraumatic.  Mouth/Throat: Oropharynx is clear and moist.  Eyes: EOM are normal. Pupils are equal, round, and reactive to light.  Neck: Normal range of motion.  Cardiovascular: Normal rate, regular rhythm and normal heart sounds.  Pulmonary/Chest: Effort normal and breath sounds normal.  Abdominal: Soft. Bowel sounds are normal.  Musculoskeletal: Normal range of motion. He exhibits no edema, tenderness or deformity.  Lymphadenopathy:    He has no cervical adenopathy.  Neurological: He is alert and oriented to person, place, and time.  Skin: Skin is warm and dry. No rash noted. No erythema.  Psychiatric: He has a normal mood and affect. His behavior is normal. Judgment and thought content normal.  Vitals reviewed.    Lab Results  Component Value Date   WBC 10.3 09/17/2017   HGB 7.6 (L) 08/27/2017   HCT 19.9 (L) 09/17/2017   MCV 94.3 09/17/2017   PLT 142 09/17/2017     Chemistry      Component Value Date/Time   NA 141 09/17/2017 0820   NA 139 08/27/2017 0751   K 3.3  09/17/2017 0820   K 3.7 08/27/2017 0751   CL 110 (H) 09/17/2017 0820   CL 106 08/27/2017 0751   CO2 23 09/17/2017 0820   CO2 19 08/27/2017 0751   BUN 56 (H) 09/17/2017 0820   BUN 60 (H) 08/27/2017 0751   CREATININE 3.4 (HH) 08/27/2017 0751      Component Value Date/Time   CALCIUM 8.5 09/17/2017 0820   CALCIUM 8.4 08/27/2017 0751   ALKPHOS 114 (H) 09/17/2017 0820   ALKPHOS 109 (H) 08/27/2017 0751   AST 17 09/17/2017 0820   ALT 13 09/17/2017 0820   ALT 15 08/27/2017 0751   BILITOT 0.5 09/17/2017 0820         Impression and Plan: Nathan Richards is a 72 year old Hispanic male. He has renal insufficiency. This is secondary to his CLL.  We will see what his CLL numbers look like.  I would like to hope that his light chains are lower.    He clearly will need to have a blood transfusion.  I think this will help him.  We will go ahead with his second cycle of treatment today.  We will see him back in another 3 weeks.   Volanda Napoleon, MD 1/24/20199:28 AM

## 2017-09-17 NOTE — Telephone Encounter (Addendum)
Critical Value Hgb 6.6, Creatinine 4.1 Dr Marin Olp notified. No orders at this time

## 2017-09-18 ENCOUNTER — Ambulatory Visit: Payer: Medicare Other | Admitting: Emergency Medicine

## 2017-09-18 ENCOUNTER — Encounter: Payer: Self-pay | Admitting: Hematology & Oncology

## 2017-09-18 ENCOUNTER — Other Ambulatory Visit: Payer: Self-pay | Admitting: *Deleted

## 2017-09-18 ENCOUNTER — Inpatient Hospital Stay: Payer: Medicare Other

## 2017-09-18 DIAGNOSIS — C9110 Chronic lymphocytic leukemia of B-cell type not having achieved remission: Secondary | ICD-10-CM | POA: Diagnosis not present

## 2017-09-18 DIAGNOSIS — D649 Anemia, unspecified: Secondary | ICD-10-CM

## 2017-09-18 LAB — ERYTHROPOIETIN: ERYTHROPOIETIN: 33 m[IU]/mL — AB (ref 2.6–18.5)

## 2017-09-18 LAB — HEPATITIS PANEL, ACUTE
Hep A IgM: NEGATIVE
Hep B C IgM: NEGATIVE
Hepatitis B Surface Ag: NEGATIVE

## 2017-09-18 MED ORDER — ACETAMINOPHEN 325 MG PO TABS
650.0000 mg | ORAL_TABLET | Freq: Once | ORAL | Status: AC
  Administered 2017-09-18: 650 mg via ORAL

## 2017-09-18 MED ORDER — SODIUM CHLORIDE 0.9% FLUSH
10.0000 mL | INTRAVENOUS | Status: AC | PRN
  Administered 2017-09-18: 10 mL
  Filled 2017-09-18: qty 10

## 2017-09-18 MED ORDER — FUROSEMIDE 10 MG/ML IJ SOLN: 20.0000 mg | Freq: Once | INTRAMUSCULAR | Status: DC

## 2017-09-18 MED ORDER — HEPARIN SOD (PORK) LOCK FLUSH 100 UNIT/ML IV SOLN
500.0000 [IU] | Freq: Every day | INTRAVENOUS | Status: AC | PRN
  Administered 2017-09-18: 500 [IU]
  Filled 2017-09-18: qty 5

## 2017-09-18 MED ORDER — ACETAMINOPHEN 325 MG PO TABS
ORAL_TABLET | ORAL | Status: AC
  Filled 2017-09-18: qty 2

## 2017-09-18 MED ORDER — SODIUM CHLORIDE 0.9 % IV SOLN
250.0000 mL | Freq: Once | INTRAVENOUS | Status: AC
  Administered 2017-09-18: 250 mL via INTRAVENOUS

## 2017-09-18 MED ORDER — LORAZEPAM 0.5 MG PO TABS: ORAL_TABLET | ORAL | 0 refills | Status: DC

## 2017-09-18 MED ORDER — DIPHENHYDRAMINE HCL 25 MG PO CAPS
25.0000 mg | ORAL_CAPSULE | Freq: Once | ORAL | Status: AC
  Administered 2017-09-18: 25 mg via ORAL

## 2017-09-18 MED ORDER — DIPHENHYDRAMINE HCL 25 MG PO CAPS
ORAL_CAPSULE | ORAL | Status: AC
  Filled 2017-09-18: qty 1

## 2017-09-18 NOTE — Patient Instructions (Signed)

## 2017-09-19 ENCOUNTER — Other Ambulatory Visit: Payer: Self-pay | Admitting: Internal Medicine

## 2017-09-20 LAB — TYPE AND SCREEN
ABO/RH(D): O POS
Antibody Screen: NEGATIVE
UNIT DIVISION: 0
UNIT DIVISION: 0

## 2017-09-20 LAB — BPAM RBC
BLOOD PRODUCT EXPIRATION DATE: 201902242359
Blood Product Expiration Date: 201902242359
ISSUE DATE / TIME: 201901250841
ISSUE DATE / TIME: 201901250841
UNIT TYPE AND RH: 5100
Unit Type and Rh: 5100

## 2017-09-22 ENCOUNTER — Ambulatory Visit (INDEPENDENT_AMBULATORY_CARE_PROVIDER_SITE_OTHER): Payer: Medicare Other | Admitting: Emergency Medicine

## 2017-09-22 ENCOUNTER — Encounter: Payer: Self-pay | Admitting: Emergency Medicine

## 2017-09-22 VITALS — BP 164/76 | HR 66 | Temp 98.1°F | Resp 16 | Ht 63.25 in | Wt 121.8 lb

## 2017-09-22 DIAGNOSIS — I10 Essential (primary) hypertension: Secondary | ICD-10-CM

## 2017-09-22 DIAGNOSIS — C9190 Lymphoid leukemia, unspecified not having achieved remission: Secondary | ICD-10-CM

## 2017-09-22 DIAGNOSIS — R1084 Generalized abdominal pain: Secondary | ICD-10-CM | POA: Diagnosis not present

## 2017-09-22 DIAGNOSIS — N183 Chronic kidney disease, stage 3 unspecified: Secondary | ICD-10-CM

## 2017-09-22 DIAGNOSIS — D649 Anemia, unspecified: Secondary | ICD-10-CM

## 2017-09-22 DIAGNOSIS — I509 Heart failure, unspecified: Secondary | ICD-10-CM

## 2017-09-22 DIAGNOSIS — C9110 Chronic lymphocytic leukemia of B-cell type not having achieved remission: Secondary | ICD-10-CM

## 2017-09-22 DIAGNOSIS — R531 Weakness: Secondary | ICD-10-CM

## 2017-09-22 NOTE — Assessment & Plan Note (Addendum)
Benign presentation.  No acute abdomen.  May be secondary to many different things including chronic right-sided failure, chronic intestinal ischemia (chronic intestinal angina), medication side effects, among many different other possibilities.  Will avoid opiates or NSAIDs.  Advised to take Tylenol as needed and Mylanta as needed.  Will observe the symptoms.  Advised to return if worse.

## 2017-09-22 NOTE — Patient Instructions (Addendum)
  Advised to take Tylenol as needed for pain.  Also advised to take Mylanta liquid every 4-6 hours as needed.  Return here anytime if worse.   IF you received an x-ray today, you will receive an invoice from [facility]. Please contact [facility] at [phone] with questions or concerns regarding your invoice.   IF you received labwork today, you will receive an invoice from [facility]. Please contact [facility] at [phone] with questions or concerns regarding your invoice.   Our billing staff will not be able to assist you with questions regarding bills from these companies.  You will be contacted with the lab results as soon as they are available. The fastest way to get your results is to activate your My Chart account. Instructions are located on the last page of this paperwork. If you have not heard from [place] regarding the results in 2 weeks, please contact this office.     Abdominal Pain, Adult Many things can cause belly (abdominal) pain. Most times, belly pain is not dangerous. Many cases of belly pain can be watched and treated at home. Sometimes belly pain is serious, though. Your doctor will try to find the cause of your belly pain. Follow these instructions at home:  Take over-the-counter and prescription medicines only as told by your doctor. Do not take medicines that help you poop (laxatives) unless told to by your doctor.  Drink enough fluid to keep your pee (urine) clear or pale yellow.  Watch your belly pain for any changes.  Keep all follow-up visits as told by your doctor. This is important. Contact a doctor if:  Your belly pain changes or gets worse.  You are not hungry, or you lose weight without trying.  You are having trouble pooping (constipated) or have watery poop (diarrhea) for more than 2-3 days.  You have pain when you pee or poop.  Your belly pain wakes you up at night.  Your pain gets worse with meals, after eating, or with certain  foods.  You are throwing up and cannot keep anything down.  You have a fever. Get help right away if:  Your pain does not go away as soon as your doctor says it should.  You cannot stop throwing up.  Your pain is only in areas of your belly, such as the right side or the left lower part of the belly.  You have bloody or black poop, or poop that looks like tar.  You have very bad pain, cramping, or bloating in your belly.  You have signs of not having enough fluid or water in your body (dehydration), such as: ? Dark pee, very little pee, or no pee. ? Cracked lips. ? Dry mouth. ? Sunken eyes. ? Sleepiness. ? Weakness. This information is not intended to replace advice given to you by your health care provider. Make sure you discuss any questions you have with your health care provider. Document Released: 01/28/2008 Document Revised: 02/29/2016 Document Reviewed: 01/23/2016 Elsevier Interactive Patient Education  2018 [employer].

## 2017-09-22 NOTE — Progress Notes (Signed)
Nathan Richards 72 y.o.   Chief Complaint  Patient presents with  . Abdominal Pain    4 days ago    HISTORY OF PRESENT ILLNESS: This is a 72 y.o. male complaining of ball generalized abdominal pain but mostly in the epigastric area intermittently for the past 4 days.  Still eating and drinking okay.  Denies nausea or vomiting.  Denies fever or chills.  Denies melena or rectal bleeding.  Has a history of congestive heart failure.  Has a history of CLL.  Has been getting transfusions as well as IV chemotherapy.  Feels weak and tired all the time.  Denies chest pain or difficulty breathing.  HPI   Prior to Admission medications   Medication Sig Start Date End Date Taking? Authorizing Provider  furosemide (LASIX) 40 MG tablet Take 1.5 tablets (60 mg total) by mouth 2 (two) times daily. 07/24/17 07/24/18 Yes Alphonzo Grieve, MD  isosorbide-hydrALAZINE (BIDIL) 20-37.5 MG tablet Take 1 tablet by mouth 3 (three) times daily. 07/24/17  Yes Alphonzo Grieve, MD  metoprolol succinate (TOPROL-XL) 50 MG 24 hr tablet Take 50 mg by mouth daily. Take with or immediately following a meal.   Yes [provider]  ondansetron (ZOFRAN) 4 MG tablet Take one tablet 3 x a day for 4 days then every 8 hours as needed for nausea 09/17/17  Yes Ennever, Rudell Cobb, MD  prochlorperazine (COMPAZINE) 10 MG tablet Take 1 tablet (10 mg total) by mouth every 6 (six) hours as needed for nausea or vomiting. 08/27/17  Yes Volanda Napoleon, MD  lidocaine-prilocaine (EMLA) cream Apply 1 application topically as needed. Patient not taking: Reported on 09/22/2017 08/27/17   Volanda Napoleon, MD  LORazepam (ATIVAN) 0.5 MG tablet Take one tablet sublingual 3 times a day x 4 days then every 8 hours as needed for nausea Patient not taking: Reported on 09/22/2017 09/18/17   Volanda Napoleon, MD    Allergies  Allergen Reactions  . Ace Inhibitors     REACTION: cough  . Amlodipine Besylate     REACTION: Impotence  . Beta Adrenergic  Blockers     REACTION: Impotence  . Hydrochlorothiazide Other (See Comments)    May have contributed to his hypercalcemia    Patient Active Problem List   Diagnosis Date Noted  . Hospital discharge follow-up 07/29/2017  . Chronic congestive heart failure ([place]) 07/29/2017  . Acute combined systolic (congestive) and diastolic (congestive) heart failure ([place] [facility]) 07/23/2017  . Chronic anemia 06/24/2017  . Itching 06/24/2017  . CLL (chronic lymphocytic leukemia) ([place]) 01/28/2017  . Chronic renal failure, stage 3 (moderate) ([place] [facility]) 01/28/2017  . Nocturia 03/21/2014  . Bilateral leg pain 02/22/2014  . Eustachian tube dysfunction 10/21/2013  . Cough 10/04/2013  . Vision changes 09/27/2013  . Noncompliance with medications 09/27/2013  . Acquired blepharoptosis 04/19/2013  . Gastritis and gastroduodenitis 03/14/2013  . CKD (chronic kidney disease) stage 4, GFR 15-29 ml/min (HCC) 02/11/2010  . Other ganglion and cyst of synovium, tendon, and bursa(727.49) 02/08/2010  . 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 CLL 07/26/2007  . ERECTILE DYSFUNCTION, SECONDARY TO MEDICATION 11/16/2006  . HYPERLIPIDEMIA 10/22/2006  . SCHIZOPHRENIA 10/22/2006  . DEPRESSION, MAJOR, RECURRENT 10/22/2006  . HEARING LOSS NOS OR DEAFNESS 10/22/2006  . Essential hypertension 10/22/2006  . ASCVD 10/22/2006  . OSTEOARTHRITIS, MULTI SITES 10/22/2006    Past Medical History:  Diagnosis Date  . CHF (congestive heart failure) ([place])   . Chronic kidney disease   . CLL (chronic lymphocytic  leukemia) ([place])   . Dyspnea   . History of kidney stones   . Hypertension   . Urolithiasis    cystoscopy in 2002    Past Surgical History:  Procedure Laterality Date  . CORONARY ANGIOPLASTY WITH STENT PLACEMENT  02/05/2004   drug eluting to LAD plus baloon 1st diagonal  . INGUINAL HERNIA REPAIR     bilateral  . IR FLUORO GUIDE PORT INSERTION RIGHT  08/26/2017  . IR US GUIDE VASC ACCESS RIGHT  08/26/2017  . KNEE ARTHROSCOPY      right, done in [place]  . UMBILICAL HERNIA REPAIR      Social History   Socioeconomic History  . Marital status: Married    Spouse name: Not on file  . Number of children: Not on file  . Years of education: Not on file  . Highest education level: Not on file  Social Needs  . Financial resource strain: Not on file  . Food insecurity - worry: Not on file  . Food insecurity - inability: Not on file  . Transportation needs - medical: Not on file  . Transportation needs - non-medical: Not on file  Occupational History    Employer: UNEMPLOYED  Tobacco Use  . Smoking status: Former Smoker    Years: 31.00    Types: Cigarettes    Last attempt to quit: 12/22/1997    Years since quitting: 19.7  . Smokeless tobacco: Never Used  Substance and Sexual Activity  . Alcohol use: No  . Drug use: No    Comment: remote use of MJ and cocaine  . Sexual activity: No  Other Topics Concern  . Not on file  Social History Narrative   Born [place], lived in [place]. For years before moving to [place]   Married, currently going through a divorce. Wife is in [place] alone    He has a son living here from a prior relationship   He and his current wife have a daughter and son together   Another son was killed at age 74   2 grand daughters in [place], [place], Jahki Witham lives locally   Disabled by psychiatric disease.    [employer]    Family History  Problem Relation Age of Onset  . Diabetes Mother   . Coronary artery disease Mother   . Coronary artery disease Father        died  . Diabetes Father   . Diabetes Brother   . Diabetes Brother        died     Review of Systems  Constitutional: Positive for malaise/fatigue. Negative for chills and fever.  HENT: Negative for congestion, nosebleeds and sore throat.   Eyes: Negative for blurred vision and double vision.  Respiratory: Positive for shortness of breath (DOE). Negative for cough, hemoptysis and wheezing.    Cardiovascular: Positive for orthopnea and leg swelling. Negative for chest pain, palpitations and claudication.  Gastrointestinal: Positive for abdominal pain (Epigastric). Negative for blood in stool, diarrhea, melena, nausea and vomiting.  Genitourinary: Negative for hematuria.  Musculoskeletal: Positive for back pain (Chronic). Negative for myalgias.  Skin: Negative for rash.  Neurological: Positive for weakness. Negative for dizziness, loss of consciousness and headaches.  Endo/Heme/Allergies: Negative.   All other systems reviewed and are negative.   Vitals:   09/22/17 1425  BP: (!) 160/82  Pulse: 66  Resp: 16  Temp: 98.1 F (36.7 C)  SpO2: 99%  Physical Exam  Constitutional: He is oriented to person, place, and time.  Chronically ill looking.  Thin.  HENT:  Head: Normocephalic and atraumatic.  Nose: Nose normal.  Mouth/Throat: Oropharynx is clear and moist.  Eyes: Conjunctivae and EOM are normal. Pupils are equal, round, and reactive to light.  Neck: Normal range of motion. Neck supple. No JVD present.  Cardiovascular: Normal rate, regular rhythm and normal heart sounds.  No murmur heard. Pulmonary/Chest: Effort normal. He has no wheezes. He has rales (At the bases).  Abdominal: Soft. He exhibits no mass. There is tenderness (Epigastric tenderness). There is no rebound and no guarding.  Musculoskeletal: Normal range of motion.  Neurological: He is alert and oriented to person, place, and time. No sensory deficit. He exhibits normal muscle tone.  No asterixis  Skin: Skin is warm and dry. Capillary refill takes less than 2 seconds. There is pallor.  Psychiatric: He has a normal mood and affect. His behavior is normal.  Vitals reviewed.  Generalized abdominal pain Benign presentation.  No acute abdomen.  May be secondary to many different things including chronic right-sided failure, chronic intestinal ischemia (chronic intestinal angina), medication side effects,  among many different other possibilities.  Will avoid opiates or NSAIDs.  Advised to take Tylenol as needed and Mylanta as needed.  Will observe the symptoms.  Advised to return if worse.    ASSESSMENT & PLAN: Jean was seen today for abdominal pain.  Diagnoses and all orders for this visit:  Generalized abdominal pain  Chronic congestive heart failure, unspecified heart failure type (HCC)  CLL (chronic lymphocytic leukemia) (HCC)  Chronic renal failure, stage 3 (moderate) (HCC)  Chronic anemia  Essential hypertension  General weakness    Patient Instructions    Advised to take Tylenol as needed for pain.  Also advised to take Mylanta liquid every 4-6 hours as needed.  Return here anytime if worse.   IF you received an x-ray today, you will receive an invoice from [facility]. Please contact [facility] at [phone] with questions or concerns regarding your invoice.   IF you received labwork today, you will receive an invoice from [facility]. Please contact [facility] at [phone] with questions or concerns regarding your invoice.   Our billing staff will not be able to assist you with questions regarding bills from these companies.  You will be contacted with the lab results as soon as they are available. The fastest way to get your results is to activate your My Chart account. Instructions are located on the last page of this paperwork. If you have not heard from [place] regarding the results in 2 weeks, please contact this office.     Abdominal Pain, Adult Many things can cause belly (abdominal) pain. Most times, belly pain is not dangerous. Many cases of belly pain can be watched and treated at home. Sometimes belly pain is serious, though. Your doctor will try to find the cause of your belly pain. Follow these instructions at home:  Take over-the-counter and prescription medicines only as told by your doctor. Do not take medicines that help you poop  (laxatives) unless told to by your doctor.  Drink enough fluid to keep your pee (urine) clear or pale yellow.  Watch your belly pain for any changes.  Keep all follow-up visits as told by your doctor. This is important. Contact a doctor if:  Your belly pain changes or gets worse.  You are not hungry, or you lose weight without trying.  You  are having trouble pooping (constipated) or have watery poop (diarrhea) for more than 2-3 days.  You have pain when you pee or poop.  Your belly pain wakes you up at night.  Your pain gets worse with meals, after eating, or with certain foods.  You are throwing up and cannot keep anything down.  You have a fever. Get help right away if:  Your pain does not go away as soon as your doctor says it should.  You cannot stop throwing up.  Your pain is only in areas of your belly, such as the right side or the left lower part of the belly.  You have bloody or black poop, or poop that looks like tar.  You have very bad pain, cramping, or bloating in your belly.  You have signs of not having enough fluid or water in your body (dehydration), such as: ? Dark pee, very little pee, or no pee. ? Cracked lips. ? Dry mouth. ? Sunken eyes. ? Sleepiness. ? Weakness. This information is not intended to replace advice given to you by your health care provider. Make sure you discuss any questions you have with your health care provider. Document Released: 01/28/2008 Document Revised: 02/29/2016 Document Reviewed: 01/23/2016 Elsevier Interactive Patient Education  2018 Elsevier Inc.      Agustina Caroli, MD [facility]

## 2017-10-01 ENCOUNTER — Other Ambulatory Visit: Payer: Self-pay | Admitting: *Deleted

## 2017-10-01 NOTE — Patient Outreach (Signed)
Nathan Richards) Care Management  10/01/2017  Nathan Richards 06/13/1946 [id number]  Telephone Assessment (60 days)  Outreach attempt today unsuccessful with use of a Spanish interpreter Nathan Richards ID# [phone]). HIPPA approved voice message left as RN has a scheduled home visit appointment with this pt in 2 weeks. Will further assess plan of care and interventions at that time.   Nathan Mina, RN Care Management Coordinator [place] Office [phone]

## 2017-10-08 ENCOUNTER — Telehealth: Payer: Self-pay | Admitting: *Deleted

## 2017-10-08 ENCOUNTER — Inpatient Hospital Stay: Payer: Medicare Other | Attending: Hematology & Oncology | Admitting: Hematology & Oncology

## 2017-10-08 ENCOUNTER — Inpatient Hospital Stay: Payer: Medicare Other

## 2017-10-08 ENCOUNTER — Other Ambulatory Visit: Payer: Self-pay

## 2017-10-08 VITALS — BP 171/102 | HR 75 | Temp 97.7°F | Resp 18 | Wt 125.8 lb

## 2017-10-08 VITALS — BP 157/90 | HR 72 | Temp 97.5°F | Resp 17

## 2017-10-08 DIAGNOSIS — C9110 Chronic lymphocytic leukemia of B-cell type not having achieved remission: Secondary | ICD-10-CM

## 2017-10-08 DIAGNOSIS — Z5111 Encounter for antineoplastic chemotherapy: Secondary | ICD-10-CM | POA: Diagnosis not present

## 2017-10-08 DIAGNOSIS — N289 Disorder of kidney and ureter, unspecified: Secondary | ICD-10-CM | POA: Insufficient documentation

## 2017-10-08 DIAGNOSIS — Z79899 Other long term (current) drug therapy: Secondary | ICD-10-CM | POA: Diagnosis not present

## 2017-10-08 DIAGNOSIS — D649 Anemia, unspecified: Secondary | ICD-10-CM

## 2017-10-08 LAB — CBC WITH DIFFERENTIAL (CANCER CENTER ONLY)
BASOS ABS: 0 10*3/uL (ref 0.0–0.1)
BASOS PCT: 1 %
EOS ABS: 0.9 10*3/uL — AB (ref 0.0–0.5)
Eosinophils Relative: 17 %
HEMATOCRIT: 21.4 % — AB (ref 38.7–49.9)
Hemoglobin: 7 g/dL — CL (ref 13.0–17.1)
Lymphocytes Relative: 52 %
Lymphs Abs: 2.7 10*3/uL (ref 0.9–3.3)
MCH: 30 pg (ref 28.0–33.4)
MCHC: 32.7 g/dL (ref 32.0–35.9)
MCV: 91.8 fL (ref 82.0–98.0)
MONO ABS: 0.1 10*3/uL (ref 0.1–0.9)
MONOS PCT: 1 %
NEUTROS ABS: 1.5 10*3/uL (ref 1.5–6.5)
Neutrophils Relative %: 29 %
PLATELETS: 97 10*3/uL — AB (ref 145–400)
RBC: 2.33 MIL/uL — ABNORMAL LOW (ref 4.20–5.70)
RDW: 15.5 % (ref 11.1–15.7)
WBC Count: 5.1 10*3/uL (ref 4.0–10.0)

## 2017-10-08 LAB — CMP (CANCER CENTER ONLY)
ALBUMIN: 2.9 g/dL — AB (ref 3.5–5.0)
ALT: 22 U/L (ref 10–47)
AST: 28 U/L (ref 11–38)
Alkaline Phosphatase: 122 U/L — ABNORMAL HIGH (ref 26–84)
Anion gap: 10 (ref 5–15)
BILIRUBIN TOTAL: 0.5 mg/dL (ref 0.2–1.6)
BUN: 64 mg/dL — ABNORMAL HIGH (ref 7–22)
CALCIUM: 9 mg/dL (ref 8.0–10.3)
CO2: 22 mmol/L (ref 18–33)
CREATININE: 4.6 mg/dL — AB (ref 0.60–1.20)
Chloride: 111 mmol/L — ABNORMAL HIGH (ref 98–108)
GLUCOSE: 115 mg/dL (ref 73–118)
Potassium: 3.7 mmol/L (ref 3.3–4.7)
Sodium: 143 mmol/L (ref 128–145)
TOTAL PROTEIN: 6.1 g/dL — AB (ref 6.4–8.1)

## 2017-10-08 LAB — SAMPLE TO BLOOD BANK

## 2017-10-08 MED ORDER — SODIUM CHLORIDE 0.9% FLUSH
10.0000 mL | INTRAVENOUS | Status: DC | PRN
  Administered 2017-10-08: 10 mL
  Filled 2017-10-08: qty 10

## 2017-10-08 MED ORDER — ACETAMINOPHEN 325 MG PO TABS
ORAL_TABLET | ORAL | Status: AC
  Filled 2017-10-08: qty 2

## 2017-10-08 MED ORDER — VINCRISTINE SULFATE CHEMO INJECTION 1 MG/ML
2.0000 mg | Freq: Once | INTRAVENOUS | Status: AC
  Administered 2017-10-08: 2 mg via INTRAVENOUS
  Filled 2017-10-08: qty 2

## 2017-10-08 MED ORDER — DIPHENHYDRAMINE HCL 25 MG PO CAPS
50.0000 mg | ORAL_CAPSULE | Freq: Once | ORAL | Status: AC
  Administered 2017-10-08: 50 mg via ORAL

## 2017-10-08 MED ORDER — DIPHENHYDRAMINE HCL 25 MG PO CAPS
ORAL_CAPSULE | ORAL | Status: AC
  Filled 2017-10-08: qty 2

## 2017-10-08 MED ORDER — SODIUM CHLORIDE 0.9 % IV SOLN
Freq: Once | INTRAVENOUS | Status: AC
  Administered 2017-10-08: 09:00:00 via INTRAVENOUS

## 2017-10-08 MED ORDER — SODIUM CHLORIDE 0.9 % IV SOLN
30.0000 mg | Freq: Once | INTRAVENOUS | Status: AC
  Administered 2017-10-08: 30 mg via INTRAVENOUS
  Filled 2017-10-08: qty 3

## 2017-10-08 MED ORDER — HEPARIN SOD (PORK) LOCK FLUSH 100 UNIT/ML IV SOLN
500.0000 [IU] | Freq: Once | INTRAVENOUS | Status: AC | PRN
  Administered 2017-10-08: 500 [IU]
  Filled 2017-10-08: qty 5

## 2017-10-08 MED ORDER — PALONOSETRON HCL INJECTION 0.25 MG/5ML
INTRAVENOUS | Status: AC
  Filled 2017-10-08: qty 5

## 2017-10-08 MED ORDER — ACETAMINOPHEN 325 MG PO TABS
650.0000 mg | ORAL_TABLET | Freq: Once | ORAL | Status: AC
  Administered 2017-10-08: 650 mg via ORAL

## 2017-10-08 MED ORDER — PALONOSETRON HCL INJECTION 0.25 MG/5ML
0.2500 mg | Freq: Once | INTRAVENOUS | Status: AC
  Administered 2017-10-08: 0.25 mg via INTRAVENOUS

## 2017-10-08 MED ORDER — SODIUM CHLORIDE 0.9 % IV SOLN
800.0000 mg/m2 | Freq: Once | INTRAVENOUS | Status: AC
  Administered 2017-10-08: 1240 mg via INTRAVENOUS
  Filled 2017-10-08: qty 62

## 2017-10-08 MED ORDER — SODIUM CHLORIDE 0.9 % IV SOLN
375.0000 mg/m2 | Freq: Once | INTRAVENOUS | Status: AC
  Administered 2017-10-08: 600 mg via INTRAVENOUS
  Filled 2017-10-08: qty 50

## 2017-10-08 NOTE — Progress Notes (Signed)
OK to treat with lab results today per MD Ennever. Will return for blood transfusion tomorrow.  Patient intentionally skipped HTN meds today prior to blood draw, he feels well and will take his medications when he returns home today.

## 2017-10-08 NOTE — Telephone Encounter (Signed)
Received notification from Richardson Landry in lab of panic lab value Hgb 7.0 and Creatinine 4.6,  Dr. Marin Olp aware. No orders received.

## 2017-10-08 NOTE — Progress Notes (Signed)
Hematology and Oncology Follow Up Visit  Nathan Richards [id number] 1946/01/22 72 y.o. 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  It is still hard to figure out what medicines he is taking and what medicines he is not taking.  He saw his cardiologist yesterday.  Apparently, he had a couple medications increased in dosage.  He is not sure which ones were increased.  He still has leg swelling.  I think the leg swelling is mostly from him being anemic.  His hemoglobin today is 7.  As such, I think we need to transfuse him.  Maybe this will help with the leg swelling.  He still has marked renal insufficiency.  His creatinine is 4.6 today.  I would like to hope that with treatment and with decrease in his light chains, that his kidney function will improve.  I think that if his kidney function can improve, a lot of his physical ailments will improve.  He has had no bleeding.  He has had no palpable lymph glands.  He says he has had some diarrhea.  Again, he is not sure what he takes for it.    Overall, I would say that his performance status is ECOG 2.  Medications:  Current Outpatient Medications:  .  furosemide (LASIX) 40 MG tablet, Take 1.5 tablets (60 mg total) by mouth 2 (two) times daily., Disp: 60 tablet, Rfl: 2 .  isosorbide-hydrALAZINE (BIDIL) 20-37.5 MG tablet, Take 1 tablet by mouth 3 (three) times daily., Disp: 90 tablet, Rfl: 2 .  metoprolol succinate (TOPROL-XL) 50 MG 24 hr tablet, Take 50 mg by mouth daily. Take with or immediately following a meal., Disp: , Rfl:  .  ondansetron (ZOFRAN) 4 MG tablet, Take one tablet 3 x a day for 4 days then every 8 hours as needed for nausea, Disp: 60 tablet, Rfl: 2 .  prochlorperazine (COMPAZINE) 10 MG tablet, Take 1 tablet (10 mg total) by mouth every 6 (six) hours as needed for nausea or  vomiting., Disp: 30 tablet, Rfl: 1 .  lidocaine-prilocaine (EMLA) cream, Apply 1 application topically as needed. (Patient not taking: Reported on 09/22/2017), Disp: 30 g, Rfl: 2 .  LORazepam (ATIVAN) 0.5 MG tablet, Take one tablet sublingual 3 times a day x 4 days then every 8 hours as needed for nausea (Patient not taking: Reported on 09/22/2017), Disp: 60 tablet, Rfl: 0  Allergies:  Allergies  Allergen Reactions  . Ace Inhibitors     REACTION: cough  . Amlodipine Besylate     REACTION: Impotence  . Beta Adrenergic Blockers     REACTION: Impotence  . Hydrochlorothiazide Other (See Comments)    May have contributed to his hypercalcemia    Past Medical History, Surgical history, Social history, and Family History were reviewed and updated.  Review of Systems: Review of Systems  Constitutional: Positive for malaise/fatigue.  HENT: Negative.   Eyes: Negative.   Respiratory: Negative.   Cardiovascular: Positive for leg swelling.  Gastrointestinal: Positive for vomiting.  Genitourinary: Negative.   Musculoskeletal: Negative.   Skin: Negative.   Neurological: Negative.   Endo/Heme/Allergies: Negative.   Psychiatric/Behavioral: Negative.      Wt Readings from Last 3 Encounters:  10/08/17 125 lb 12 oz (57 kg)  09/22/17 121 lb 12.8 oz (55.2 kg)  09/17/17 120 lb  4 oz (54.5 kg)     Physical Exam  Constitutional: He is oriented to person, place, and time.  HENT:  Head: Normocephalic and atraumatic.  Mouth/Throat: Oropharynx is clear and moist.  Eyes: EOM are normal. Pupils are equal, round, and reactive to light.  Neck: Normal range of motion.  Cardiovascular: Normal rate, regular rhythm and normal heart sounds.  Pulmonary/Chest: Effort normal and breath sounds normal.  Abdominal: Soft. Bowel sounds are normal.  Musculoskeletal: Normal range of motion. He exhibits no edema, tenderness or deformity.  Lymphadenopathy:    He has no cervical adenopathy.  Neurological: He is  alert and oriented to person, place, and time.  Skin: Skin is warm and dry. No rash noted. No erythema.  Psychiatric: He has a normal mood and affect. His behavior is normal. Judgment and thought content normal.  Vitals reviewed.    Lab Results  Component Value Date   WBC 5.1 10/08/2017   HGB 7.6 (L) 08/27/2017   HCT 21.4 (L) 10/08/2017   MCV 91.8 10/08/2017   PLT 97 (L) 10/08/2017     Chemistry      Component Value Date/Time   NA 143 10/08/2017 0745   NA 139 08/27/2017 0751   K 3.7 10/08/2017 0745   K 3.7 08/27/2017 0751   CL 111 (H) 10/08/2017 0745   CL 106 08/27/2017 0751   CO2 22 10/08/2017 0745   CO2 19 08/27/2017 0751   BUN 64 (H) 10/08/2017 0745   BUN 60 (H) 08/27/2017 0751   CREATININE 4.60 (HH) 10/08/2017 0745   CREATININE 3.4 (HH) 08/27/2017 0751      Component Value Date/Time   CALCIUM 9.0 10/08/2017 0745   CALCIUM 8.4 08/27/2017 0751   ALKPHOS 122 (H) 10/08/2017 0745   ALKPHOS 109 (H) 08/27/2017 0751   AST 28 10/08/2017 0745   ALT 22 10/08/2017 0745   ALT 15 08/27/2017 0751   BILITOT 0.5 10/08/2017 0745         Impression and Plan: Nathan Richards is a 72 year old Hispanic male. He has renal insufficiency. This is secondary to his CLL.  I would like to think that he is improving.  Again, we have to see what his light chain levels are.  We will go ahead and transfuse him 2 units tomorrow.  I will plan to get him back in another 3 weeks for his fourth cycle of treatment.  His white cell count has come down.  Hopefully, this will be indicative of a response.  I spent about 30 minutes with him.  Over 50% of the time was spent face-to-face talking about the blood transfusion, the indications for his transfusion, his lab results, and recommendations for the future.  He is in agreement.   Volanda Napoleon, MD 2/14/20198:15 AM

## 2017-10-08 NOTE — Patient Instructions (Addendum)
[place] Discharge Instructions for Patients Receiving Chemotherapy  Today you received the following chemotherapy agents Rituxan, Cytoxan and Vincristine.  To help prevent nausea and vomiting after your treatment, we encourage you to take your nausea medication as directed BUT NO ZOFRAN FOR 3 DAYS .   If you develop nausea and vomiting that is not controlled by your nausea medication, call the clinic.   BELOW ARE SYMPTOMS THAT SHOULD BE REPORTED IMMEDIATELY:  *FEVER GREATER THAN 100.5 F  *CHILLS WITH OR WITHOUT FEVER  NAUSEA AND VOMITING THAT IS NOT CONTROLLED WITH YOUR NAUSEA MEDICATION  *UNUSUAL SHORTNESS OF BREATH  *UNUSUAL BRUISING OR BLEEDING  TENDERNESS IN MOUTH AND THROAT WITH OR WITHOUT PRESENCE OF ULCERS  *URINARY PROBLEMS  *BOWEL PROBLEMS  UNUSUAL RASH Items with * indicate a potential emergency and should be followed up as soon as possible.  Feel free to call the clinic you have any questions or concerns. The clinic phone number is [phone].  Please show the [facility] at check-in to the Emergency Department and triage nurse.

## 2017-10-09 ENCOUNTER — Other Ambulatory Visit: Payer: Self-pay

## 2017-10-09 ENCOUNTER — Other Ambulatory Visit: Payer: Self-pay | Admitting: *Deleted

## 2017-10-09 ENCOUNTER — Inpatient Hospital Stay: Payer: Medicare Other

## 2017-10-09 ENCOUNTER — Other Ambulatory Visit: Payer: Self-pay | Admitting: Family

## 2017-10-09 DIAGNOSIS — D649 Anemia, unspecified: Secondary | ICD-10-CM

## 2017-10-09 DIAGNOSIS — C9110 Chronic lymphocytic leukemia of B-cell type not having achieved remission: Secondary | ICD-10-CM

## 2017-10-09 DIAGNOSIS — Z5111 Encounter for antineoplastic chemotherapy: Secondary | ICD-10-CM | POA: Diagnosis not present

## 2017-10-09 LAB — PREPARE RBC (CROSSMATCH)

## 2017-10-09 LAB — KAPPA/LAMBDA LIGHT CHAINS
KAPPA FREE LGHT CHN: 84.1 mg/L — AB (ref 3.3–19.4)
Kappa, lambda light chain ratio: 0.82 (ref 0.26–1.65)
LAMDA FREE LIGHT CHAINS: 102.6 mg/L — AB (ref 5.7–26.3)

## 2017-10-09 LAB — IGG, IGA, IGM
IgA: 152 mg/dL (ref 61–437)
IgG (Immunoglobin G), Serum: 1196 mg/dL (ref 700–1600)
IgM (Immunoglobulin M), Srm: 68 mg/dL (ref 20–172)

## 2017-10-09 LAB — ERYTHROPOIETIN: Erythropoietin: 26.2 m[IU]/mL — ABNORMAL HIGH (ref 2.6–18.5)

## 2017-10-09 MED ORDER — HEPARIN SOD (PORK) LOCK FLUSH 100 UNIT/ML IV SOLN
500.0000 [IU] | Freq: Every day | INTRAVENOUS | Status: AC | PRN
  Administered 2017-10-09: 500 [IU]
  Filled 2017-10-09: qty 5

## 2017-10-09 MED ORDER — SODIUM CHLORIDE 0.9% FLUSH
10.0000 mL | INTRAVENOUS | Status: AC | PRN
  Administered 2017-10-09: 10 mL
  Filled 2017-10-09: qty 10

## 2017-10-09 MED ORDER — SODIUM CHLORIDE 0.9 % IV SOLN
250.0000 mL | Freq: Once | INTRAVENOUS | Status: AC
  Administered 2017-10-09: 250 mL via INTRAVENOUS

## 2017-10-09 MED ORDER — FUROSEMIDE 10 MG/ML IJ SOLN
20.0000 mg | Freq: Once | INTRAMUSCULAR | Status: AC
  Administered 2017-10-09: 20 mg via INTRAVENOUS

## 2017-10-09 MED ORDER — ACETAMINOPHEN 325 MG PO TABS
ORAL_TABLET | ORAL | Status: AC
  Filled 2017-10-09: qty 2

## 2017-10-09 MED ORDER — FUROSEMIDE 10 MG/ML IJ SOLN
INTRAMUSCULAR | Status: AC
  Filled 2017-10-09: qty 4

## 2017-10-09 MED ORDER — ACETAMINOPHEN 325 MG PO TABS
650.0000 mg | ORAL_TABLET | Freq: Once | ORAL | Status: AC
  Administered 2017-10-09: 650 mg via ORAL

## 2017-10-09 MED ORDER — DIPHENHYDRAMINE HCL 25 MG PO CAPS
25.0000 mg | ORAL_CAPSULE | Freq: Once | ORAL | Status: AC
  Administered 2017-10-09: 25 mg via ORAL

## 2017-10-09 MED ORDER — DIPHENHYDRAMINE HCL 25 MG PO CAPS
ORAL_CAPSULE | ORAL | Status: AC
  Filled 2017-10-09: qty 1

## 2017-10-09 NOTE — Patient Instructions (Signed)

## 2017-10-10 LAB — BPAM RBC
BLOOD PRODUCT EXPIRATION DATE: 201903192359
Blood Product Expiration Date: 201903192359
ISSUE DATE / TIME: 201902150925
ISSUE DATE / TIME: 201902150925
UNIT TYPE AND RH: 5100
Unit Type and Rh: 5100

## 2017-10-10 LAB — TYPE AND SCREEN
ABO/RH(D): O POS
Antibody Screen: NEGATIVE
Unit division: 0
Unit division: 0

## 2017-10-13 LAB — PROTEIN ELECTROPHORESIS, SERUM, WITH REFLEX
A/G RATIO SPE: 1.1 (ref 0.7–1.7)
ALPHA-2-GLOBULIN: 0.7 g/dL (ref 0.4–1.0)
Albumin ELP: 3.1 g/dL (ref 2.9–4.4)
Alpha-1-Globulin: 0.3 g/dL (ref 0.0–0.4)
BETA GLOBULIN: 0.7 g/dL (ref 0.7–1.3)
GAMMA GLOBULIN: 1 g/dL (ref 0.4–1.8)
GLOBULIN, TOTAL: 2.7 g/dL (ref 2.2–3.9)
M-Spike, %: 0.3 g/dL — ABNORMAL HIGH
SPEP Interpretation: 0
Total Protein ELP: 5.8 g/dL — ABNORMAL LOW (ref 6.0–8.5)

## 2017-10-15 ENCOUNTER — Telehealth: Payer: Self-pay | Admitting: Emergency Medicine

## 2017-10-15 ENCOUNTER — Other Ambulatory Visit: Payer: Self-pay | Admitting: *Deleted

## 2017-10-15 NOTE — Patient Outreach (Addendum)
[facility]) Care Management   10/15/2017  Nathan Richards 21-May-1946 [id number]  Nathan Richards is an 72 y.o. male  Spanish interpreter on visit today [name] [facility]) until 1:38pm and Spanish interpreter on telephonic Nathan Richards ID #[id number] 1:38pm-2:10pm Subjective:  HF: Pt reports he continue he is weighting daily and documents all his weights but still not to sure when to reach out to his provider with his symptoms or weights. Pt was able to states after further education to call his doctor with 2 lbs weight gained and any symptoms related but again not really sure.  Pt reports some SOB last night prior to sleeping on 2 pillows that was "scary" but pt was able to recover and slept okay for the rest of the night. Pt did not contact or seek medical attention at that time of distress. MEDICATION: Pt reports he takes 80 mg Lasix daily as recommended by his doctor. Pt is aware of all other medication when reviewed today. APPOINTMENTS: Pt states he continues to attend all appointments as scheduled with sufficient transportation (family). Pt states he will be moving on 2/28 to [place], [place], [place] and requested a home visit at this address for next month.  Pt also requesting a new cardiologist and requested RN to assist with the process. Pt is aware that RN will need to contact her primary provider with this request. Pt states he is not getting call backs an his wait time is for long periods of time "30 minutes" reports patient on one occasion.  Objective:   Review of Systems  Constitutional: Negative.   HENT: Negative.   Eyes: Negative.   Respiratory:       Pt reports some SOB during the night and RN findings for today RLL with crackles  Cardiovascular: Positive for leg swelling.       Bilateral LE Right 2+ and left 1+  Gastrointestinal: Negative.   Genitourinary: Negative.   Musculoskeletal: Negative.   Skin: Negative.   Neurological: Negative.    Endo/Heme/Allergies: Negative.   Psychiatric/Behavioral: Negative.     Physical Exam  Constitutional: He is oriented to person, place, and time. He appears well-developed and well-nourished.  HENT:  Right Ear: External ear normal.  Left Ear: External ear normal.  Eyes: EOM are normal.  Neck: Normal range of motion.  Cardiovascular: Normal heart sounds.  Respiratory: Effort normal and breath sounds normal.  GI: Soft. Bowel sounds are normal.  Musculoskeletal: He exhibits edema.  Bilateral edema right 2+ left 1+  Neurological: He is alert and oriented to person, place, and time.  Skin: Skin is warm and dry.  Psychiatric: He has a normal mood and affect. His behavior is normal. Judgment and thought content normal.    Encounter Medications:   Outpatient Encounter Medications as of 10/15/2017  Medication Sig  . furosemide (LASIX) 80 MG tablet Take 80 mg by mouth daily.  . isosorbide-hydrALAZINE (BIDIL) 20-37.5 MG tablet Take 1 tablet by mouth 3 (three) times daily.  . metoprolol succinate (TOPROL-XL) 50 MG 24 hr tablet Take 50 mg by mouth daily. Take with or immediately following a meal.  . ondansetron (ZOFRAN) 4 MG tablet Take one tablet 3 x a day for 4 days then every 8 hours as needed for nausea  . furosemide (LASIX) 40 MG tablet Take 1.5 tablets (60 mg total) by mouth 2 (two) times daily. (Patient not taking: Reported on 10/15/2017)  . lidocaine-prilocaine (EMLA) cream Apply 1 application topically as needed. (Patient not  taking: Reported on 09/22/2017)  . LORazepam (ATIVAN) 0.5 MG tablet Take one tablet sublingual 3 times a day x 4 days then every 8 hours as needed for nausea (Patient not taking: Reported on 09/22/2017)  . prochlorperazine (COMPAZINE) 10 MG tablet Take 1 tablet (10 mg total) by mouth every 6 (six) hours as needed for nausea or vomiting. (Patient not taking: Reported on 10/15/2017)   No facility-administered encounter medications on file as of 10/15/2017.      Functional Status:   In your present state of health, do you have any difficulty performing the following activities: 08/26/2017 08/07/2017  Hearing? N N  Vision? N N  Difficulty concentrating or making decisions? N Y  Walking or climbing stairs? N Y  Dressing or bathing? N N  Doing errands, shopping? - Y  Conservation officer, nature and eating ? - N  Using the Toilet? - N  In the past six months, have you accidently leaked urine? - N  Do you have problems with loss of bowel control? - N  Managing your Medications? - N  Managing your Finances? - N  Housekeeping or managing your Housekeeping? - N  Some recent data might be hidden    Fall/Depression Screening:    Fall Risk  09/22/2017 08/07/2017 07/29/2017  Falls in the past year? No Yes Yes  Number falls in past yr: - 1 1  Injury with Fall? - No -  Risk for fall due to : - History of fall(s) -  Follow up - Falls prevention discussed -   PHQ 2/9 Scores 09/22/2017 08/07/2017 07/29/2017 07/28/2017 07/22/2017 06/24/2017 01/28/2017  PHQ - 2 Score 0 0 0 0 0 0 0  PHQ- 9 Score - - - - - - -   BP (!) 182/86 (BP Location: Left Arm, Patient Position: Sitting, Cuff Size: Normal)   Pulse 65   Resp 20   Ht 1.575 m ($Remove'5\' 2"'SapUymd$ )   Wt 129 lb (58.5 kg)   SpO2 98%   BMI 23.59 kg/m  Assessment:   Ongoing case management related to HF Edema related to lower extremities Medication adherence related to Lasix Provider related cardiologist HTN related to elevated BP Respiratory related to RLL with crackles  Plan:  Will obtain feedback on pt's understanding of what to do if symptoms occur with his HF. Will review the HF zones and verify pt's zone today (YELLOW). Will strongly encouraged pt to contact his provider when he is in the YELLOW zone and why this is important. Will review pt's documented readings concerning his weights. Will note yesterday 124 lbs and today 129 lbs with symptoms of SOB last night. Strongly stressed to pt to act on his symptoms for assist  with his stressful breathing and/or symptoms related to his HF. Will explain early prevention measures lower the risk of acute episodes and hospitalization. Educated pt that he needs to be in the [place] with no precipitating symptoms and provided this guideline via [facility] calendar (pt verbalized an understanding). Will discussed elevating his lower legs to reduce ongoing swelling and verify his medications concerning his fluid retentino (Lasix).  Note RN has discussed compression stockings in the pass however this was difficult for pt to understanding when to remove and when to apply but will stress once again to elevate his lower legs to reduce ongoing swelling.  Will review all medications and verified with the provider on pt's dosage (bottle indicate 80 mg daily and Epic indicated 60 mg BID). Will contact pt's primary provider (Dr.  Sagardia) at pt's request to change his cardiologist to another networking office. Spoke with Butch Penny with this request and also reported all other issues today concerning what has been called to pt's cardiologist Jeri Lager FNP-C). Will report the pt's elevated BP today on this home visit , clarity on medication (Lasix), fluid retention with a weight gained of 5 lbs overnight with SOB symptoms, crackles to RLL up asculatation and edema to LE. Reported all information has been called to pt's cardiologist for intervention while on this home visit.  Will contact his cardiologist (spoke with Telford Nab) concerning pt's elevated BP 182/86 Left arm along with all other symptoms and findings as noted above of today's assessment which includes all of pt's vitals and fluid retention related to pt's  HF with 5 lbs gained overnight from 124 lbs yesterday and 129 lbs this morning with SOB last night, crackles to RLL, edema to LE. RN will  inform office that pt will need an interpreter on the call back with any interventions. Note office confirmed pt should be taking Lasix 80 mg daily.  Will  reported findings of RLL crackles to Cardiologist office aware (note pt is not in distress at this time based upon today's findings). Will alert pt to contact this RN if no response by the end of today. Pt aware to again seek medical attention with acute symptoms. Will discussed the plan of care, goals and interventions and adjust accordingly to today's assessment. Will offer another home visit for next month at his next address (pt very receptive). Will scheduled and continue to address pt's medical issues accordingly. Note both pt's cardiologist and his primary provider updated on today's assessment and findings reported.  Will follow up Monday on any interventions that may occur over the next 24 hours from pt's provider's office.  Raina Mina, RN Care Management Coordinator [place] Office [phone]

## 2017-10-15 NOTE — Telephone Encounter (Signed)
Copied from [facility] [id number]. Topic: Quick Communication - See Telephone Encounter >> Oct 15, 2017  2:25 PM Hewitt Shorts wrote: CRM for notification. See Telephone encounter for:  Pt home health nurse is calling to state that pt has been trying to get in touch with cardiologsit that we referred him to but they do not call them back but he has gained 5 pounds in one night taking 80 mg of lasix and blood pressure was 182/82  And that pt would like a new cardiologist and the home health nurse called the cardiologist again today but no return call  10/15/17.

## 2017-10-19 ENCOUNTER — Other Ambulatory Visit: Payer: Self-pay | Admitting: *Deleted

## 2017-10-19 NOTE — Telephone Encounter (Signed)
Spoke with someone at [facility] and they talked to pt on 2/22 about his bp and it sounded like they called him in some bp medication to take amlodipine.Marland Kitchen

## 2017-10-19 NOTE — Patient Outreach (Signed)
[facility]) Care Management  10/19/2017  Nathan Richards July 10, 1946 [id number]       Telephone Assessment  RN spoke with pt today with help of an interpreter Nathan Richards ID# [phone]. Inquired if pt received a call from his provider with interventions concerning his elevated BP. Pt reports he was prescribed Amlodipine 5 mg twice daily and feels much better. No other changes reported or suggested by the provider when the provider's office called.  Pt admits he has not weighedt over the last 2 days but feels much better and not has swollen has he was before. Plan of care reviewed and interventions adjusted accordingly.  Nathan Mina, RN Care Management Coordinator [place] Office [phone]

## 2017-10-19 NOTE — Telephone Encounter (Signed)
Thanks

## 2017-10-19 NOTE — Telephone Encounter (Signed)
Please advise 

## 2017-10-29 ENCOUNTER — Inpatient Hospital Stay: Payer: Medicare Other | Attending: Hematology & Oncology | Admitting: Hematology & Oncology

## 2017-10-29 ENCOUNTER — Inpatient Hospital Stay: Payer: Medicare Other

## 2017-10-29 ENCOUNTER — Other Ambulatory Visit: Payer: Self-pay

## 2017-10-29 ENCOUNTER — Telehealth: Payer: Self-pay | Admitting: *Deleted

## 2017-10-29 ENCOUNTER — Encounter: Payer: Self-pay | Admitting: Hematology & Oncology

## 2017-10-29 VITALS — BP 144/73 | HR 68 | Temp 98.1°F | Resp 17

## 2017-10-29 VITALS — BP 167/77 | HR 67 | Temp 97.9°F | Resp 18 | Wt 127.0 lb

## 2017-10-29 DIAGNOSIS — Z5111 Encounter for antineoplastic chemotherapy: Secondary | ICD-10-CM | POA: Diagnosis not present

## 2017-10-29 DIAGNOSIS — D631 Anemia in chronic kidney disease: Secondary | ICD-10-CM | POA: Diagnosis not present

## 2017-10-29 DIAGNOSIS — I129 Hypertensive chronic kidney disease with stage 1 through stage 4 chronic kidney disease, or unspecified chronic kidney disease: Secondary | ICD-10-CM | POA: Insufficient documentation

## 2017-10-29 DIAGNOSIS — R5383 Other fatigue: Secondary | ICD-10-CM | POA: Diagnosis not present

## 2017-10-29 DIAGNOSIS — C9110 Chronic lymphocytic leukemia of B-cell type not having achieved remission: Secondary | ICD-10-CM | POA: Insufficient documentation

## 2017-10-29 DIAGNOSIS — Z79899 Other long term (current) drug therapy: Secondary | ICD-10-CM | POA: Diagnosis not present

## 2017-10-29 DIAGNOSIS — N189 Chronic kidney disease, unspecified: Secondary | ICD-10-CM | POA: Diagnosis not present

## 2017-10-29 DIAGNOSIS — R55 Syncope and collapse: Secondary | ICD-10-CM | POA: Insufficient documentation

## 2017-10-29 DIAGNOSIS — N184 Chronic kidney disease, stage 4 (severe): Secondary | ICD-10-CM

## 2017-10-29 LAB — IRON AND TIBC
IRON: 52 ug/dL (ref 42–163)
Saturation Ratios: 26 % — ABNORMAL LOW (ref 42–163)
TIBC: 200 ug/dL — ABNORMAL LOW (ref 202–409)
UIBC: 148 ug/dL

## 2017-10-29 LAB — CMP (CANCER CENTER ONLY)
ALK PHOS: 104 U/L — AB (ref 26–84)
ALT: 19 U/L (ref 10–47)
AST: 26 U/L (ref 11–38)
Albumin: 3.1 g/dL — ABNORMAL LOW (ref 3.5–5.0)
Anion gap: 7 (ref 5–15)
BUN: 45 mg/dL — AB (ref 7–22)
CO2: 24 mmol/L (ref 18–33)
CREATININE: 3.8 mg/dL — AB (ref 0.60–1.20)
Calcium: 8.9 mg/dL (ref 8.0–10.3)
Chloride: 108 mmol/L (ref 98–108)
GLUCOSE: 95 mg/dL (ref 73–118)
Potassium: 3.6 mmol/L (ref 3.3–4.7)
SODIUM: 139 mmol/L (ref 128–145)
Total Bilirubin: 0.4 mg/dL (ref 0.2–1.6)
Total Protein: 6 g/dL — ABNORMAL LOW (ref 6.4–8.1)

## 2017-10-29 LAB — LACTATE DEHYDROGENASE: LDH: 209 U/L (ref 125–245)

## 2017-10-29 LAB — SAMPLE TO BLOOD BANK

## 2017-10-29 LAB — CBC WITH DIFFERENTIAL (CANCER CENTER ONLY)
Basophils Absolute: 0.1 10*3/uL (ref 0.0–0.1)
Basophils Relative: 1 %
EOS ABS: 0.8 10*3/uL — AB (ref 0.0–0.5)
Eosinophils Relative: 20 %
HEMATOCRIT: 24 % — AB (ref 38.7–49.9)
HEMOGLOBIN: 7.9 g/dL — AB (ref 13.0–17.1)
LYMPHS ABS: 2 10*3/uL (ref 0.9–3.3)
LYMPHS PCT: 53 %
MCH: 30.2 pg (ref 28.0–33.4)
MCHC: 32.9 g/dL (ref 32.0–35.9)
MCV: 91.6 fL (ref 82.0–98.0)
MONOS PCT: 1 %
Monocytes Absolute: 0 10*3/uL — ABNORMAL LOW (ref 0.1–0.9)
NEUTROS PCT: 25 %
Neutro Abs: 1 10*3/uL — ABNORMAL LOW (ref 1.5–6.5)
Platelet Count: 92 10*3/uL — ABNORMAL LOW (ref 145–400)
RBC: 2.62 MIL/uL — AB (ref 4.20–5.70)
RDW: 15.4 % (ref 11.1–15.7)
WBC Count: 3.8 10*3/uL — ABNORMAL LOW (ref 4.0–10.0)

## 2017-10-29 LAB — FERRITIN: Ferritin: 207 ng/mL (ref 22–316)

## 2017-10-29 MED ORDER — ACETAMINOPHEN 325 MG PO TABS
650.0000 mg | ORAL_TABLET | Freq: Once | ORAL | Status: AC
  Administered 2017-10-29: 650 mg via ORAL

## 2017-10-29 MED ORDER — DIPHENHYDRAMINE HCL 25 MG PO CAPS
50.0000 mg | ORAL_CAPSULE | Freq: Once | ORAL | Status: AC
  Administered 2017-10-29: 50 mg via ORAL

## 2017-10-29 MED ORDER — HEPARIN SOD (PORK) LOCK FLUSH 100 UNIT/ML IV SOLN
500.0000 [IU] | Freq: Once | INTRAVENOUS | Status: AC | PRN
  Administered 2017-10-29: 500 [IU]
  Filled 2017-10-29: qty 5

## 2017-10-29 MED ORDER — SODIUM CHLORIDE 0.9 % IV SOLN
30.0000 mg | Freq: Once | INTRAVENOUS | Status: AC
  Administered 2017-10-29: 30 mg via INTRAVENOUS
  Filled 2017-10-29: qty 3

## 2017-10-29 MED ORDER — SODIUM CHLORIDE 0.9 % IV SOLN
375.0000 mg/m2 | Freq: Once | INTRAVENOUS | Status: AC
  Administered 2017-10-29: 600 mg via INTRAVENOUS
  Filled 2017-10-29: qty 50

## 2017-10-29 MED ORDER — ACETAMINOPHEN 325 MG PO TABS
ORAL_TABLET | ORAL | Status: AC
  Filled 2017-10-29: qty 2

## 2017-10-29 MED ORDER — SODIUM CHLORIDE 0.9% FLUSH
10.0000 mL | INTRAVENOUS | Status: DC | PRN
Start: 2017-10-29 — End: 2017-10-29
  Administered 2017-10-29: 10 mL
  Filled 2017-10-29: qty 10

## 2017-10-29 MED ORDER — CYCLOPHOSPHAMIDE CHEMO INJECTION 1 GM
800.0000 mg/m2 | Freq: Once | INTRAMUSCULAR | Status: AC
  Administered 2017-10-29: 1240 mg via INTRAVENOUS
  Filled 2017-10-29: qty 62

## 2017-10-29 MED ORDER — SODIUM CHLORIDE 0.9 % IV SOLN
Freq: Once | INTRAVENOUS | Status: AC
  Administered 2017-10-29: 09:00:00 via INTRAVENOUS

## 2017-10-29 MED ORDER — DARBEPOETIN ALFA 300 MCG/0.6ML IJ SOSY
300.0000 ug | PREFILLED_SYRINGE | Freq: Once | INTRAMUSCULAR | Status: AC
  Administered 2017-10-29: 300 ug via SUBCUTANEOUS

## 2017-10-29 MED ORDER — DARBEPOETIN ALFA 300 MCG/0.6ML IJ SOSY
PREFILLED_SYRINGE | INTRAMUSCULAR | Status: AC
Start: 2017-10-29 — End: 2017-10-29
  Filled 2017-10-29: qty 0.6

## 2017-10-29 MED ORDER — DIPHENHYDRAMINE HCL 25 MG PO CAPS
ORAL_CAPSULE | ORAL | Status: AC
  Filled 2017-10-29: qty 2

## 2017-10-29 MED ORDER — PALONOSETRON HCL INJECTION 0.25 MG/5ML
0.2500 mg | Freq: Once | INTRAVENOUS | Status: AC
  Administered 2017-10-29: 0.25 mg via INTRAVENOUS

## 2017-10-29 MED ORDER — PALONOSETRON HCL INJECTION 0.25 MG/5ML
INTRAVENOUS | Status: AC
  Filled 2017-10-29: qty 5

## 2017-10-29 MED ORDER — SODIUM CHLORIDE 0.9 % IV SOLN
2.0000 mg | Freq: Once | INTRAVENOUS | Status: AC
  Administered 2017-10-29: 2 mg via INTRAVENOUS
  Filled 2017-10-29: qty 2

## 2017-10-29 NOTE — Progress Notes (Signed)
Hematology and Oncology Follow Up Visit  Nathan Richards [id number] 1945/12/27 72 y.o. 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  He has an interpreter with him.  She really is making a big difference.  He says that he is taking his medications.  He does have some swelling in his legs.  The swelling is probably from his chronic anemia.  His erythropoietin level is only 26.  Given his renal insufficiency and low erythropoietin level, I will try him on some Aranesp.  I think this would be reasonable.  My only concern is that he must take his blood pressure medications.  I told him this.  The interpreter help me.  He does understand.  I will try him on 300 mcg of Aranesp.  His last monoclonal spike was 0.3 g/dL.  His light chain levels are coming down.  His Kappa Lightchain was 8.4 mg/dL.  His lambda light chain was 12.2 mg/dL.  He has had no nausea or vomiting.  He has had no bleeding.  Is had no problems with bowels or bladder.  He has had no cough or shortness of breath.  There is been no fever.  Overall, his performance status is ECOG 2.    Medications:  Current Outpatient Medications:  .  amLODipine (NORVASC) 5 MG tablet, Take 5 mg by mouth 2 (two) times daily., Disp: , Rfl:  .  furosemide (LASIX) 40 MG tablet, Take 1.5 tablets (60 mg total) by mouth 2 (two) times daily. (Patient not taking: Reported on 10/15/2017), Disp: 60 tablet, Rfl: 2 .  furosemide (LASIX) 80 MG tablet, Take 80 mg by mouth daily., Disp: , Rfl:  .  isosorbide-hydrALAZINE (BIDIL) 20-37.5 MG tablet, Take 1 tablet by mouth 3 (three) times daily., Disp: 90 tablet, Rfl: 2 .  lidocaine-prilocaine (EMLA) cream, Apply 1 application topically as needed. (Patient not taking: Reported on 09/22/2017), Disp:  30 g, Rfl: 2 .  LORazepam (ATIVAN) 0.5 MG tablet, Take one tablet sublingual 3 times a day x 4 days then every 8 hours as needed for nausea (Patient not taking: Reported on 09/22/2017), Disp: 60 tablet, Rfl: 0 .  metoprolol succinate (TOPROL-XL) 50 MG 24 hr tablet, Take 50 mg by mouth daily. Take with or immediately following a meal., Disp: , Rfl:  .  ondansetron (ZOFRAN) 4 MG tablet, Take one tablet 3 x a day for 4 days then every 8 hours as needed for nausea, Disp: 60 tablet, Rfl: 2 .  prochlorperazine (COMPAZINE) 10 MG tablet, Take 1 tablet (10 mg total) by mouth every 6 (six) hours as needed for nausea or vomiting. (Patient not taking: Reported on 10/15/2017), Disp: 30 tablet, Rfl: 1  Allergies:  Allergies  Allergen Reactions  . Ace Inhibitors     REACTION: cough  . Amlodipine Besylate     REACTION: Impotence  . Beta Adrenergic Blockers     REACTION: Impotence  . Hydrochlorothiazide Other (See Comments)    May have contributed to his hypercalcemia    Past Medical History, Surgical history, Social history, and Family History were reviewed and updated.  Review of Systems: Review of Systems  Constitutional: Positive for malaise/fatigue.  HENT: Negative.   Eyes: Negative.   Respiratory: Negative.  Cardiovascular: Positive for leg swelling.  Gastrointestinal: Positive for vomiting.  Genitourinary: Negative.   Musculoskeletal: Negative.   Skin: Negative.   Neurological: Negative.   Endo/Heme/Allergies: Negative.   Psychiatric/Behavioral: Negative.      Wt Readings from Last 3 Encounters:  10/15/17 129 lb (58.5 kg)  10/08/17 125 lb 12 oz (57 kg)  09/22/17 121 lb 12.8 oz (55.2 kg)     Physical Exam  Constitutional: He is oriented to person, place, and time.  HENT:  Head: Normocephalic and atraumatic.  Mouth/Throat: Oropharynx is clear and moist.  Eyes: EOM are normal. Pupils are equal, round, and reactive to light.  Neck: Normal range of motion.  Cardiovascular: Normal  rate, regular rhythm and normal heart sounds.  Pulmonary/Chest: Effort normal and breath sounds normal.  Abdominal: Soft. Bowel sounds are normal.  Musculoskeletal: Normal range of motion. He exhibits no edema, tenderness or deformity.  Lymphadenopathy:    He has no cervical adenopathy.  Neurological: He is alert and oriented to person, place, and time.  Skin: Skin is warm and dry. No rash noted. No erythema.  Psychiatric: He has a normal mood and affect. His behavior is normal. Judgment and thought content normal.  Vitals reviewed.    Lab Results  Component Value Date   WBC 5.1 10/08/2017   HGB 7.6 (L) 08/27/2017   HCT 21.4 (L) 10/08/2017   MCV 91.8 10/08/2017   PLT 97 (L) 10/08/2017     Chemistry      Component Value Date/Time   NA 143 10/08/2017 0745   NA 139 08/27/2017 0751   K 3.7 10/08/2017 0745   K 3.7 08/27/2017 0751   CL 111 (H) 10/08/2017 0745   CL 106 08/27/2017 0751   CO2 22 10/08/2017 0745   CO2 19 08/27/2017 0751   BUN 64 (H) 10/08/2017 0745   BUN 60 (H) 08/27/2017 0751   CREATININE 4.60 (HH) 10/08/2017 0745   CREATININE 3.4 (HH) 08/27/2017 0751      Component Value Date/Time   CALCIUM 9.0 10/08/2017 0745   CALCIUM 8.4 08/27/2017 0751   ALKPHOS 122 (H) 10/08/2017 0745   ALKPHOS 109 (H) 08/27/2017 0751   AST 28 10/08/2017 0745   ALT 22 10/08/2017 0745   ALT 15 08/27/2017 0751   BILITOT 0.5 10/08/2017 0745         Impression and Plan: Nathan Richards is a 72 year old Hispanic male. He has renal insufficiency. This is secondary to his CLL.  We will go ahead with his fourth cycle of treatment.  We will see what his light chains are.  We will start him on Aranesp today.  I will also need to see what his iron levels are.  Again, hopefully, he will take his blood pressure medications.  We will plan to get him back in 3 more weeks.   Volanda Napoleon, MD 3/7/20198:33 AM

## 2017-10-29 NOTE — Patient Instructions (Signed)
Vincristine injection What is this medicine? VINCRISTINE (vin KRIS teen) is a chemotherapy drug. It slows the growth of cancer cells. This medicine is used to treat many types of cancer like Hodgkin's disease, leukemia, non-Hodgkin's lymphoma, neuroblastoma (brain cancer), rhabdomyosarcoma, and Wilms' tumor. This medicine may be used for other purposes; ask your health care provider or pharmacist if you have questions. COMMON BRAND NAME(S): Oncovin, Vincasar PFS What should I tell my health care provider before I take this medicine? They need to know if you have any of these conditions: -blood disorders -gout -infection (especially chickenpox, cold sores, or herpes) -kidney disease -liver disease -lung disease -nervous system disease like Charcot-Marie-Tooth (CMT) -recent or ongoing radiation therapy -an unusual or allergic reaction to vincristine, other chemotherapy agents, other medicines, foods, dyes, or preservatives -pregnant or trying to get pregnant -breast-feeding How should I use this medicine? This drug is given as an infusion into a vein. It is administered in a hospital or clinic by a specially trained health care professional. If you have pain, swelling, burning, or any unusual feeling around the site of your injection, tell your health care professional right away. Talk to your pediatrician regarding the use of this medicine in children. While this drug may be prescribed for selected conditions, precautions do apply. Overdosage: If you think you have taken too much of this medicine contact a poison control center or emergency room at once. NOTE: This medicine is only for you. Do not share this medicine with others. What if I miss a dose? It is important not to miss your dose. Call your doctor or health care professional if you are unable to keep an appointment. What may interact with this medicine? Do not take this medicine with any of the following  medications: -itraconazole -mibefradil -voriconazole This medicine may also interact with the following medications: -cyclosporine -erythromycin -fluconazole -ketoconazole -medicines for HIV like delavirdine, efavirenz, nevirapine -medicines for seizures like ethotoin, fosphenotoin, phenytoin -medicines to increase blood counts like filgrastim, pegfilgrastim, sargramostim -other chemotherapy drugs like cisplatin, L-asparaginase, methotrexate, mitomycin, paclitaxel -pegaspargase -vaccines -zalcitabine, ddC Talk to your doctor or health care professional before taking any of these medicines: -acetaminophen -aspirin -ibuprofen -ketoprofen -naproxen This list may not describe all possible interactions. Give your health care provider a list of all the medicines, herbs, non-prescription drugs, or dietary supplements you use. Also tell them if you smoke, drink alcohol, or use illegal drugs. Some items may interact with your medicine. What should I watch for while using this medicine? Your condition will be monitored carefully while you are receiving this medicine. You will need important blood work done while you are taking this medicine. This drug may make you feel generally unwell. This is not uncommon, as chemotherapy can affect healthy cells as well as cancer cells. Report any side effects. Continue your course of treatment even though you feel ill unless your doctor tells you to stop. In some cases, you may be given additional medicines to help with side effects. Follow all directions for their use. Call your doctor or health care professional for advice if you get a fever, chills or sore throat, or other symptoms of a cold or flu. Do not treat yourself. Avoid taking products that contain aspirin, acetaminophen, ibuprofen, naproxen, or ketoprofen unless instructed by your doctor. These medicines may hide a fever. Do not become pregnant while taking this medicine. Women should inform their  doctor if they wish to become pregnant or think they might be pregnant. There is a  potential for serious side effects to an unborn child. Talk to your health care professional or pharmacist for more information. Do not breast-feed an infant while taking this medicine. Men may have a lower sperm count while taking this medicine. Talk to your doctor if you plan to father a child. What side effects may I notice from receiving this medicine? Side effects that you should report to your doctor or health care professional as soon as possible: -allergic reactions like skin rash, itching or hives, swelling of the face, lips, or tongue -breathing problems -confusion or changes in emotions or moods -constipation -cough -mouth sores -muscle weakness -nausea and vomiting -pain, swelling, redness or irritation at the injection site -pain, tingling, numbness in the hands or feet -problems with balance, talking, walking -seizures -stomach pain -trouble passing urine or change in the amount of urine Side effects that usually do not require medical attention (report to your doctor or health care professional if they continue or are bothersome): -diarrhea -hair loss -jaw pain -loss of appetite This list may not describe all possible side effects. Call your doctor for medical advice about side effects. You may report side effects to FDA at 1-800-FDA-1088. Where should I keep my medicine? This drug is given in a hospital or clinic and will not be stored at home. NOTE: This sheet is a summary. It may not cover all possible information. If you have questions about this medicine, talk to your doctor, pharmacist, or health care provider.  2018 Elsevier/Gold Standard (2008-05-08 17:17:13) Rituximab injection What is this medicine? RITUXIMAB (ri TUX i mab) is a monoclonal antibody. It is used to treat certain types of cancer like non-Hodgkin lymphoma and chronic lymphocytic leukemia. It is also used to treat  rheumatoid arthritis, granulomatosis with polyangiitis (or Wegener's granulomatosis), and microscopic polyangiitis. This medicine may be used for other purposes; ask your health care provider or pharmacist if you have questions. COMMON BRAND NAME(S): Rituxan What should I tell my health care provider before I take this medicine? They need to know if you have any of these conditions: -heart disease -infection (especially a virus infection such as hepatitis B, chickenpox, cold sores, or herpes) -immune system problems -irregular heartbeat -kidney disease -lung or breathing disease, like asthma -recently received or scheduled to receive a vaccine -an unusual or allergic reaction to rituximab, mouse proteins, other medicines, foods, dyes, or preservatives -pregnant or trying to get pregnant -breast-feeding How should I use this medicine? This medicine is for infusion into a vein. It is administered in a hospital or clinic by a specially trained health care professional. A special MedGuide will be given to you by the pharmacist with each prescription and refill. Be sure to read this information carefully each time. Talk to your pediatrician regarding the use of this medicine in children. This medicine is not approved for use in children. Overdosage: If you think you have taken too much of this medicine contact a poison control center or emergency room at once. NOTE: This medicine is only for you. Do not share this medicine with others. What if I miss a dose? It is important not to miss a dose. Call your doctor or health care professional if you are unable to keep an appointment. What may interact with this medicine? -cisplatin -other medicines for arthritis like disease modifying antirheumatic drugs or tumor necrosis factor inhibitors -live virus vaccines This list may not describe all possible interactions. Give your health care provider a list of all the medicines, herbs,  non-prescription  drugs, or dietary supplements you use. Also tell them if you smoke, drink alcohol, or use illegal drugs. Some items may interact with your medicine. What should I watch for while using this medicine? Your condition will be monitored carefully while you are receiving this medicine. You may need blood work done while you are taking this medicine. This medicine can cause serious allergic reactions. To reduce your risk you may need to take medicine before treatment with this medicine. Take your medicine as directed. In some patients, this medicine may cause a serious brain infection that may cause death. If you have any problems seeing, thinking, speaking, walking, or standing, tell your doctor right away. If you cannot reach your doctor, urgently seek other source of medical care. Call your doctor or health care professional for advice if you get a fever, chills or sore throat, or other symptoms of a cold or flu. Do not treat yourself. This drug decreases your body's ability to fight infections. Try to avoid being around people who are sick. Do not become pregnant while taking this medicine or for 12 months after stopping it. Women should inform their doctor if they wish to become pregnant or think they might be pregnant. There is a potential for serious side effects to an unborn child. Talk to your health care professional or pharmacist for more information. What side effects may I notice from receiving this medicine? Side effects that you should report to your doctor or health care professional as soon as possible: -breathing problems -chest pain -dizziness or feeling faint -fast, irregular heartbeat -low blood counts - this medicine may decrease the number of white blood cells, red blood cells and platelets. You may be at increased risk for infections and bleeding. -mouth sores -redness, blistering, peeling or loosening of the skin, including inside the mouth (this can be added for any serious or  exfoliative rash that could lead to hospitalization) -signs of infection - fever or chills, cough, sore throat, pain or difficulty passing urine -signs and symptoms of kidney injury like trouble passing urine or change in the amount of urine -signs and symptoms of liver injury like dark yellow or brown urine; general ill feeling or flu-like symptoms; light-colored stools; loss of appetite; nausea; right upper belly pain; unusually weak or tired; yellowing of the eyes or skin -stomach pain -vomiting Side effects that usually do not require medical attention (report to your doctor or health care professional if they continue or are bothersome): -headache -joint pain -muscle cramps or muscle pain This list may not describe all possible side effects. Call your doctor for medical advice about side effects. You may report side effects to FDA at 1-800-FDA-1088. Where should I keep my medicine? This drug is given in a hospital or clinic and will not be stored at home. NOTE: This sheet is a summary. It may not cover all possible information. If you have questions about this medicine, talk to your doctor, pharmacist, or health care provider.  2018 Elsevier/Gold Standard (2016-03-19 15:28:09) Cyclophosphamide injection What is this medicine? CYCLOPHOSPHAMIDE (sye kloe FOSS fa mide) is a chemotherapy drug. It slows the growth of cancer cells. This medicine is used to treat many types of cancer like lymphoma, myeloma, leukemia, breast cancer, and ovarian cancer, to name a few. This medicine may be used for other purposes; ask your health care provider or pharmacist if you have questions. COMMON BRAND NAME(S): Cytoxan, Neosar What should I tell my health care provider before I take  this medicine? They need to know if you have any of these conditions: -blood disorders -history of other chemotherapy -infection -kidney disease -liver disease -recent or ongoing radiation therapy -tumors in the bone  marrow -an unusual or allergic reaction to cyclophosphamide, other chemotherapy, other medicines, foods, dyes, or preservatives -pregnant or trying to get pregnant -breast-feeding How should I use this medicine? This drug is usually given as an injection into a vein or muscle or by infusion into a vein. It is administered in a hospital or clinic by a specially trained health care professional. Talk to your pediatrician regarding the use of this medicine in children. Special care may be needed. Overdosage: If you think you have taken too much of this medicine contact a poison control center or emergency room at once. NOTE: This medicine is only for you. Do not share this medicine with others. What if I miss a dose? It is important not to miss your dose. Call your doctor or health care professional if you are unable to keep an appointment. What may interact with this medicine? This medicine may interact with the following medications: -amiodarone -amphotericin B -azathioprine -certain antiviral medicines for HIV or AIDS such as protease inhibitors (e.g., indinavir, ritonavir) and zidovudine -certain blood pressure medications such as benazepril, captopril, enalapril, fosinopril, lisinopril, moexipril, monopril, perindopril, quinapril, ramipril, trandolapril -certain cancer medications such as anthracyclines (e.g., daunorubicin, doxorubicin), busulfan, cytarabine, paclitaxel, pentostatin, tamoxifen, trastuzumab -certain diuretics such as chlorothiazide, chlorthalidone, hydrochlorothiazide, indapamide, metolazone -certain medicines that treat or prevent blood clots like warfarin -certain muscle relaxants such as succinylcholine -cyclosporine -etanercept -indomethacin -medicines to increase blood counts like filgrastim, pegfilgrastim, sargramostim -medicines used as general anesthesia -metronidazole -natalizumab This list may not describe all possible interactions. Give your health care  provider a list of all the medicines, herbs, non-prescription drugs, or dietary supplements you use. Also tell them if you smoke, drink alcohol, or use illegal drugs. Some items may interact with your medicine. What should I watch for while using this medicine? Visit your doctor for checks on your progress. This drug may make you feel generally unwell. This is not uncommon, as chemotherapy can affect healthy cells as well as cancer cells. Report any side effects. Continue your course of treatment even though you feel ill unless your doctor tells you to stop. Drink water or other fluids as directed. Urinate often, even at night. In some cases, you may be given additional medicines to help with side effects. Follow all directions for their use. Call your doctor or health care professional for advice if you get a fever, chills or sore throat, or other symptoms of a cold or flu. Do not treat yourself. This drug decreases your body's ability to fight infections. Try to avoid being around people who are sick. This medicine may increase your risk to bruise or bleed. Call your doctor or health care professional if you notice any unusual bleeding. Be careful brushing and flossing your teeth or using a toothpick because you may get an infection or bleed more easily. If you have any dental work done, tell your dentist you are receiving this medicine. You may get drowsy or dizzy. Do not drive, use machinery, or do anything that needs mental alertness until you know how this medicine affects you. Do not become pregnant while taking this medicine or for 1 year after stopping it. Women should inform their doctor if they wish to become pregnant or think they might be pregnant. Men should not father a child  while taking this medicine and for 4 months after stopping it. There is a potential for serious side effects to an unborn child. Talk to your health care professional or pharmacist for more information. Do not  breast-feed an infant while taking this medicine. This medicine may interfere with the ability to have a child. This medicine has caused ovarian failure in some women. This medicine has caused reduced sperm counts in some men. You should talk with your doctor or health care professional if you are concerned about your fertility. If you are going to have surgery, tell your doctor or health care professional that you have taken this medicine. What side effects may I notice from receiving this medicine? Side effects that you should report to your doctor or health care professional as soon as possible: -allergic reactions like skin rash, itching or hives, swelling of the face, lips, or tongue -low blood counts - this medicine may decrease the number of white blood cells, red blood cells and platelets. You may be at increased risk for infections and bleeding. -signs of infection - fever or chills, cough, sore throat, pain or difficulty passing urine -signs of decreased platelets or bleeding - bruising, pinpoint red spots on the skin, black, tarry stools, blood in the urine -signs of decreased red blood cells - unusually weak or tired, fainting spells, lightheadedness -breathing problems -dark urine -dizziness -palpitations -swelling of the ankles, feet, hands -trouble passing urine or change in the amount of urine -weight gain -yellowing of the eyes or skin Side effects that usually do not require medical attention (report to your doctor or health care professional if they continue or are bothersome): -changes in nail or skin color -hair loss -missed menstrual periods -mouth sores -nausea, vomiting This list may not describe all possible side effects. Call your doctor for medical advice about side effects. You may report side effects to FDA at 1-800-FDA-1088. Where should I keep my medicine? This drug is given in a hospital or clinic and will not be stored at home. NOTE: This sheet is a  summary. It may not cover all possible information. If you have questions about this medicine, talk to your doctor, pharmacist, or health care provider.  2018 Elsevier/Gold Standard (2012-06-25 16:22:58)

## 2017-10-29 NOTE — Telephone Encounter (Signed)
Critical Value Creatinine 3.8 Dr Marin Olp notified. No orders at this time.

## 2017-10-29 NOTE — Progress Notes (Signed)
OK to treat with creatinine 3.8 & ANC 1.0 per Dr Marin Olp.  dph

## 2017-10-30 LAB — KAPPA/LAMBDA LIGHT CHAINS
KAPPA FREE LGHT CHN: 74.6 mg/L — AB (ref 3.3–19.4)
Kappa, lambda light chain ratio: 0.68 (ref 0.26–1.65)
Lambda free light chains: 109.2 mg/L — ABNORMAL HIGH (ref 5.7–26.3)

## 2017-11-02 ENCOUNTER — Other Ambulatory Visit: Payer: Self-pay | Admitting: *Deleted

## 2017-11-02 NOTE — Patient Outreach (Signed)
[facility]) Care Management  11/02/2017  Nathan Richards [birth year] [id number]    [place] interpreter [place] [phone]  RN contacted pt today who was pending a move to his new apartment. RN contacted pt today to inquire on the new address for the pending home visit scheduled in two weeks. RN available and provided the new address. Will follow up accordingly with a home visit on 3/27 at the pt's new address. No issues or request during this call today.   Raina Mina, RN Care Management Coordinator [place] [facility] [phone]

## 2017-11-03 LAB — PROTEIN ELECTROPHORESIS, SERUM, WITH REFLEX
A/G Ratio: 1.3 (ref 0.7–1.7)
ALPHA-2-GLOBULIN: 0.7 g/dL (ref 0.4–1.0)
Albumin ELP: 3.2 g/dL (ref 2.9–4.4)
Alpha-1-Globulin: 0.2 g/dL (ref 0.0–0.4)
Beta Globulin: 0.7 g/dL (ref 0.7–1.3)
GLOBULIN, TOTAL: 2.5 g/dL (ref 2.2–3.9)
Gamma Globulin: 1 g/dL (ref 0.4–1.8)
M-SPIKE, %: 0.3 g/dL — AB
SPEP Interpretation: 0
TOTAL PROTEIN ELP: 5.7 g/dL — AB (ref 6.0–8.5)

## 2017-11-03 LAB — IMMUNOFIXATION REFLEX, SERUM
IGA: 145 mg/dL (ref 61–437)
IGG (IMMUNOGLOBIN G), SERUM: 1049 mg/dL (ref 700–1600)
IgM (Immunoglobulin M), Srm: 63 mg/dL (ref 20–172)

## 2017-11-18 ENCOUNTER — Encounter: Payer: Self-pay | Admitting: *Deleted

## 2017-11-18 ENCOUNTER — Telehealth: Payer: Self-pay | Admitting: Emergency Medicine

## 2017-11-18 ENCOUNTER — Other Ambulatory Visit: Payer: Self-pay | Admitting: *Deleted

## 2017-11-18 NOTE — Telephone Encounter (Signed)
Provider, FYI. 

## 2017-11-18 NOTE — Patient Outreach (Signed)
[facility]) Care Management   11/18/2017  Nathan Richards 1945-11-06 [id number]  Nathan Richards is an 71 y.o. male  Subjective:  HF: Pt reports he continue to complete his daily weights but admits he has not contacted his provider with any of his abnormal readings. Pt states he doesn't feel good on most days due to his ongoing treatment for his new diagnosis of cancer but tries to keep up with is weights daily. Denies any major issues related to his HF with SOB or issues with his appetite. Pt states he sleeps a lot so not much swelling but very tired and weak on most days.  Based upon pt's symptoms of feeling very tired, weak and indicated he just wants to sleep. Pt has requested RN to inform his provider of today assessment and findings. Pt states he does not wish to make an appointment or visit the ED or have the paramedics to visit at this time. States he usually feels like this after chemotherapy.    Objective:   Review of Systems  Constitutional: Negative.   HENT: Negative.   Eyes: Negative.   Respiratory: Negative.   Cardiovascular: Positive for leg swelling.       Bilateral edema 1+ to LL  Gastrointestinal: Positive for diarrhea.       Diarrhea related to chemotherapy treatment  Genitourinary: Negative.   Musculoskeletal: Negative.   Skin: Negative.   Neurological: Negative.   Endo/Heme/Allergies: Negative.   Psychiatric/Behavioral: Negative.     Physical Exam  Constitutional: He is oriented to person, place, and time. He appears well-developed and well-nourished.  HENT:  Right Ear: External ear normal.  Left Ear: External ear normal.  Eyes: EOM are normal.  Neck: Normal range of motion.  Cardiovascular: Normal heart sounds.  Respiratory: Effort normal and breath sounds normal.  GI: Soft. Bowel sounds are normal.  Musculoskeletal: He exhibits edema.  Bilateral edema to LL at 1+  Neurological: He is alert and oriented to person, place, and time.  Skin:  Skin is warm and dry.  Psychiatric: He has a normal mood and affect. His behavior is normal. Judgment and thought content normal.    Encounter Medications:   Outpatient Encounter Medications as of 11/18/2017  Medication Sig  . amLODipine (NORVASC) 5 MG tablet Take 5 mg by mouth 2 (two) times daily.  . furosemide (LASIX) 40 MG tablet Take 1.5 tablets (60 mg total) by mouth 2 (two) times daily. (Patient not taking: Reported on 10/15/2017)  . furosemide (LASIX) 80 MG tablet Take 80 mg by mouth daily.  . isosorbide-hydrALAZINE (BIDIL) 20-37.5 MG tablet Take 1 tablet by mouth 3 (three) times daily.  Marland Kitchen lidocaine-prilocaine (EMLA) cream Apply 1 application topically as needed. (Patient not taking: Reported on 09/22/2017)  . LORazepam (ATIVAN) 0.5 MG tablet Take one tablet sublingual 3 times a day x 4 days then every 8 hours as needed for nausea (Patient not taking: Reported on 09/22/2017)  . metoprolol succinate (TOPROL-XL) 100 MG 24 hr tablet Take 100 mg by mouth daily.  . ondansetron (ZOFRAN) 4 MG tablet Take one tablet 3 x a day for 4 days then every 8 hours as needed for nausea  . prochlorperazine (COMPAZINE) 10 MG tablet Take 1 tablet (10 mg total) by mouth every 6 (six) hours as needed for nausea or vomiting. (Patient not taking: Reported on 10/15/2017)   No facility-administered encounter medications on file as of 11/18/2017.     Functional Status:   In your present state of  health, do you have any difficulty performing the following activities: 08/26/2017 08/07/2017  Hearing? N N  Vision? N N  Difficulty concentrating or making decisions? N Y  Walking or climbing stairs? N Y  Dressing or bathing? N N  Doing errands, shopping? - Y  Conservation officer, nature and eating ? - N  Using the Toilet? - N  In the past six months, have you accidently leaked urine? - N  Do you have problems with loss of bowel control? - N  Managing your Medications? - N  Managing your Finances? - N  Housekeeping or managing your  Housekeeping? - N  Some recent data might be hidden    Fall/Depression Screening:    Fall Risk  09/22/2017 08/07/2017 07/29/2017  Falls in the past year? No Yes Yes  Number falls in past yr: - 1 1  Injury with Fall? - No -  Risk for fall due to : - History of fall(s) -  Follow up - Falls prevention discussed -   PHQ 2/9 Scores 09/22/2017 08/07/2017 07/29/2017 07/28/2017 07/22/2017 06/24/2017 01/28/2017  PHQ - 2 Score 0 0 0 0 0 0 0  PHQ- 9 Score - - - - - - -   \BP 140/60 (BP Location: Left Arm, Patient Position: Sitting, Cuff Size: Normal)   Pulse 67   Resp 20   Wt 119 lb (54 kg)   SpO2 98%   BMI 21.77 kg/m   Assessment:   Ongoing follow up related HF Follow up on bilateral swelling to LL Follow up HTN related to last home visit Level of care related Assisted Living  Plan:  Will view all documented weights over the last week and stress once again the importance of fluid retention and risk if not monitored or treated accordingly. Will verify pt remains in the in GREEN zone however did not report any weight gains over the last week with the 4 and 3 lbs noted in his documentation. Will review the HF zones and clarify pt's knowledge based on understanding the importance of managing his HF with prevention measures. RN will stress again how important it is to monitor fluid retention to prevent acute episodes of HF from occurring (pt verbalized an understanding).  Will continue to educate pt on elevating his LL to prevent ongoing swelling and the risk involved if this is not monitored or prevented.  Will also verify pt's BP has improved and pt with no additional HTN issues over the last month.  Plan of care/goals and interventions discussed today with adjustment on interventions based upon pt's progress. Will discuss a high level of care related to pt's declining health if considered in the future along with [facility]. Pt receptive to palliative care with [facility] and receptive to a  referral.  RN contacted pt's primary provider with today's findings per pt's request and informed the office that pt is receptive to a referral to [facility] for palliative care services. RN will contact [facility] and make this request pending provider's approval. No interventions received by the provider's office at this time. Will schedule a visit next month if [facility] is not involved with this pt.  Raina Mina, RN Care Management Coordinator [place] Office [phone]

## 2017-11-18 NOTE — Telephone Encounter (Signed)
Not Edward Saguier's Pt.

## 2017-11-18 NOTE — Telephone Encounter (Signed)
Copied from [place] [id number]. Topic: General - Other >> Nov 18, 2017  1:38 PM Darl Householder, RMA wrote: Reason for CRM: Nathan Richards from [facility] is calling to notify Nathan Richards that pt is very weak today at home visit, vital signs are stable, pt gained 5lbs overnight, Nathan Richards is going to make a referral to [facility], please return call to [place] at [phone]

## 2017-11-19 ENCOUNTER — Other Ambulatory Visit: Payer: Self-pay

## 2017-11-19 ENCOUNTER — Inpatient Hospital Stay (HOSPITAL_BASED_OUTPATIENT_CLINIC_OR_DEPARTMENT_OTHER): Payer: Medicare Other | Admitting: Hematology & Oncology

## 2017-11-19 ENCOUNTER — Inpatient Hospital Stay: Payer: Medicare Other

## 2017-11-19 ENCOUNTER — Encounter (HOSPITAL_BASED_OUTPATIENT_CLINIC_OR_DEPARTMENT_OTHER): Payer: Self-pay | Admitting: Emergency Medicine

## 2017-11-19 ENCOUNTER — Emergency Department (HOSPITAL_BASED_OUTPATIENT_CLINIC_OR_DEPARTMENT_OTHER)
Admission: EM | Admit: 2017-11-19 | Discharge: 2017-11-19 | Disposition: A | Discharge status: Home / Self Care | Payer: Medicare Other | Attending: Emergency Medicine | Admitting: Emergency Medicine

## 2017-11-19 VITALS — BP 165/70 | HR 74 | Temp 98.1°F | Resp 18 | Wt 118.0 lb

## 2017-11-19 DIAGNOSIS — Z955 Presence of coronary angioplasty implant and graft: Secondary | ICD-10-CM | POA: Insufficient documentation

## 2017-11-19 DIAGNOSIS — I129 Hypertensive chronic kidney disease with stage 1 through stage 4 chronic kidney disease, or unspecified chronic kidney disease: Secondary | ICD-10-CM | POA: Diagnosis not present

## 2017-11-19 DIAGNOSIS — E860 Dehydration: Secondary | ICD-10-CM | POA: Diagnosis not present

## 2017-11-19 DIAGNOSIS — N184 Chronic kidney disease, stage 4 (severe): Secondary | ICD-10-CM

## 2017-11-19 DIAGNOSIS — R5383 Other fatigue: Secondary | ICD-10-CM

## 2017-11-19 DIAGNOSIS — R55 Syncope and collapse: Secondary | ICD-10-CM | POA: Diagnosis present

## 2017-11-19 DIAGNOSIS — N189 Chronic kidney disease, unspecified: Secondary | ICD-10-CM | POA: Diagnosis not present

## 2017-11-19 DIAGNOSIS — D631 Anemia in chronic kidney disease: Secondary | ICD-10-CM

## 2017-11-19 DIAGNOSIS — R197 Diarrhea, unspecified: Secondary | ICD-10-CM

## 2017-11-19 DIAGNOSIS — Z856 Personal history of leukemia: Secondary | ICD-10-CM | POA: Insufficient documentation

## 2017-11-19 DIAGNOSIS — R42 Dizziness and giddiness: Secondary | ICD-10-CM | POA: Insufficient documentation

## 2017-11-19 DIAGNOSIS — Z5111 Encounter for antineoplastic chemotherapy: Secondary | ICD-10-CM | POA: Diagnosis not present

## 2017-11-19 DIAGNOSIS — I5042 Chronic combined systolic (congestive) and diastolic (congestive) heart failure: Secondary | ICD-10-CM | POA: Diagnosis not present

## 2017-11-19 DIAGNOSIS — Z87891 Personal history of nicotine dependence: Secondary | ICD-10-CM | POA: Insufficient documentation

## 2017-11-19 DIAGNOSIS — Z79899 Other long term (current) drug therapy: Secondary | ICD-10-CM | POA: Diagnosis not present

## 2017-11-19 DIAGNOSIS — I130 Hypertensive heart and chronic kidney disease with heart failure and stage 1 through stage 4 chronic kidney disease, or unspecified chronic kidney disease: Secondary | ICD-10-CM | POA: Insufficient documentation

## 2017-11-19 DIAGNOSIS — C9110 Chronic lymphocytic leukemia of B-cell type not having achieved remission: Secondary | ICD-10-CM | POA: Diagnosis not present

## 2017-11-19 LAB — CBC WITH DIFFERENTIAL (CANCER CENTER ONLY)
Basophils Absolute: 0 10*3/uL (ref 0.0–0.1)
Basophils Relative: 1 %
EOS ABS: 0.7 10*3/uL — AB (ref 0.0–0.5)
Eosinophils Relative: 18 %
HCT: 24.1 % — ABNORMAL LOW (ref 38.7–49.9)
HEMOGLOBIN: 7.9 g/dL — AB (ref 13.0–17.1)
LYMPHS ABS: 2 10*3/uL (ref 0.9–3.3)
Lymphocytes Relative: 57 %
MCH: 28.9 pg (ref 28.0–33.4)
MCHC: 32.8 g/dL (ref 32.0–35.9)
MCV: 88.3 fL (ref 82.0–98.0)
Monocytes Absolute: 0 10*3/uL — ABNORMAL LOW (ref 0.1–0.9)
Monocytes Relative: 1 %
NEUTROS PCT: 23 %
Neutro Abs: 0.8 10*3/uL — ABNORMAL LOW (ref 1.5–6.5)
Platelet Count: 185 10*3/uL (ref 145–400)
RBC: 2.73 MIL/uL — AB (ref 4.20–5.70)
RDW: 14.7 % (ref 11.1–15.7)
WBC: 3.6 10*3/uL — AB (ref 4.0–10.0)

## 2017-11-19 LAB — URINALYSIS, ROUTINE W REFLEX MICROSCOPIC
BILIRUBIN URINE: NEGATIVE
Glucose, UA: 100 mg/dL — AB
Ketones, ur: NEGATIVE mg/dL
Leukocytes, UA: NEGATIVE
Nitrite: NEGATIVE
Protein, ur: >300 mg/dL — AB
SPECIFIC GRAVITY, URINE: 1.02 (ref 1.005–1.030)
pH: 8 (ref 5.0–8.0)

## 2017-11-19 LAB — COMPREHENSIVE METABOLIC PANEL
ALBUMIN: 2.6 g/dL — AB (ref 3.5–5.0)
ALK PHOS: 80 U/L (ref 38–126)
ALT: 12 U/L — AB (ref 17–63)
AST: 15 U/L (ref 15–41)
Anion gap: 8 (ref 5–15)
BUN: 43 mg/dL — AB (ref 6–20)
CALCIUM: 8.8 mg/dL — AB (ref 8.9–10.3)
CHLORIDE: 105 mmol/L (ref 101–111)
CO2: 21 mmol/L — AB (ref 22–32)
CREATININE: 3.95 mg/dL — AB (ref 0.61–1.24)
GFR calc Af Amer: 16 mL/min — ABNORMAL LOW (ref >60)
GFR calc non Af Amer: 14 mL/min — ABNORMAL LOW (ref >60)
GLUCOSE: 157 mg/dL — AB (ref 65–99)
Potassium: 3.6 mmol/L (ref 3.5–5.1)
SODIUM: 134 mmol/L — AB (ref 135–145)
Total Bilirubin: 0.6 mg/dL (ref 0.3–1.2)
Total Protein: 5.8 g/dL — ABNORMAL LOW (ref 6.5–8.1)

## 2017-11-19 LAB — CBC WITH DIFFERENTIAL/PLATELET
BAND NEUTROPHILS: 0 %
BASOS PCT: 0 %
BLASTS: 0 %
Basophils Absolute: 0 10*3/uL (ref 0.0–0.1)
EOS ABS: 1.2 10*3/uL — AB (ref 0.0–0.7)
Eosinophils Relative: 32 %
HCT: 22.1 % — ABNORMAL LOW (ref 39.0–52.0)
HEMOGLOBIN: 7.2 g/dL — AB (ref 13.0–17.0)
LYMPHS PCT: 27 %
Lymphs Abs: 1 10*3/uL (ref 0.7–4.0)
MCH: 28.5 pg (ref 26.0–34.0)
MCHC: 32.6 g/dL (ref 30.0–36.0)
MCV: 87.4 fL (ref 78.0–100.0)
MONO ABS: 0.1 10*3/uL (ref 0.1–1.0)
MYELOCYTES: 0 %
Metamyelocytes Relative: 0 %
Monocytes Relative: 2 %
NEUTROS PCT: 39 %
Neutro Abs: 1.3 10*3/uL — ABNORMAL LOW (ref 1.7–7.7)
OTHER: 0 %
PLATELETS: 207 10*3/uL (ref 150–400)
Promyelocytes Absolute: 0 %
RBC: 2.53 MIL/uL — ABNORMAL LOW (ref 4.22–5.81)
RDW: 14.9 % (ref 11.5–15.5)
WBC: 3.6 10*3/uL — ABNORMAL LOW (ref 4.0–10.5)
nRBC: 0 /100 WBC

## 2017-11-19 LAB — CMP (CANCER CENTER ONLY)
ALBUMIN: 2.8 g/dL — AB (ref 3.5–5.0)
ALK PHOS: 89 U/L — AB (ref 26–84)
ALT: 15 U/L (ref 10–47)
AST: 15 U/L (ref 11–38)
Anion gap: 8 (ref 5–15)
BUN: 41 mg/dL — AB (ref 7–22)
CO2: 26 mmol/L (ref 18–33)
CREATININE: 3.7 mg/dL — AB (ref 0.60–1.20)
Calcium: 9.5 mg/dL (ref 8.0–10.3)
Chloride: 105 mmol/L (ref 98–108)
Glucose, Bld: 106 mg/dL (ref 73–118)
Potassium: 3.7 mmol/L (ref 3.3–4.7)
SODIUM: 139 mmol/L (ref 128–145)
Total Bilirubin: 0.6 mg/dL (ref 0.2–1.6)
Total Protein: 6.2 g/dL — ABNORMAL LOW (ref 6.4–8.1)

## 2017-11-19 LAB — LACTATE DEHYDROGENASE: LDH: 171 U/L (ref 125–245)

## 2017-11-19 LAB — IRON AND TIBC
Iron: 21 ug/dL — ABNORMAL LOW (ref 42–163)
Saturation Ratios: 14 % — ABNORMAL LOW (ref 42–163)
TIBC: 150 ug/dL — ABNORMAL LOW (ref 202–409)
UIBC: 129 ug/dL

## 2017-11-19 LAB — I-STAT CG4 LACTIC ACID, ED: Lactic Acid, Venous: 0.63 mmol/L (ref 0.5–1.9)

## 2017-11-19 LAB — URINALYSIS, MICROSCOPIC (REFLEX): Squamous Epithelial / LPF: NONE SEEN

## 2017-11-19 LAB — FERRITIN: FERRITIN: 293 ng/mL (ref 22–316)

## 2017-11-19 MED ORDER — SODIUM CHLORIDE 0.9 % IV BOLUS
2000.0000 mL | Freq: Once | INTRAVENOUS | Status: AC
  Administered 2017-11-19: 2000 mL via INTRAVENOUS

## 2017-11-19 MED ORDER — HEPARIN SOD (PORK) LOCK FLUSH 100 UNIT/ML IV SOLN
INTRAVENOUS | Status: AC
  Administered 2017-11-19: 500 [IU]
  Filled 2017-11-19: qty 5

## 2017-11-19 MED ORDER — LOPERAMIDE HCL 2 MG PO CAPS: 2.0000 mg | ORAL_CAPSULE | Freq: Four times a day (QID) | ORAL | 0 refills | Status: DC | PRN

## 2017-11-19 MED ORDER — HEPARIN SOD (PORK) LOCK FLUSH 100 UNIT/ML IV SOLN
500.0000 [IU] | Freq: Once | INTRAVENOUS | Status: AC
  Administered 2017-11-19: 500 [IU]

## 2017-11-19 MED FILL — SM ANTI-DIARRHEAL 2 MG CAPL: 2 | 12 days supply | Qty: 48 | Fill #0

## 2017-11-19 NOTE — ED Notes (Signed)
NAD at this time. Pt is stable and going home.  

## 2017-11-19 NOTE — ED Triage Notes (Signed)
Pt from cancer center upstairs; staff sts pt near syncopal when he stood to use BR; pt pale, not responding to verbal, but able to maintain position in wc.

## 2017-11-19 NOTE — Progress Notes (Signed)
Patient accompanied by interpreter Marly from [place].

## 2017-11-19 NOTE — ED Provider Notes (Signed)
[facility] EMERGENCY DEPARTMENT Provider Note   CSN: [id number] Arrival date & time: 11/19/17  1018     History   Chief Complaint Chief Complaint  Patient presents with  . Near Syncope  . Weakness    HPI Nathan Richards is a 72 y.o. male.  Patient is a 72 year old male with a history of CLL on chemotherapy, CHF, chronic kidney disease, hypertension who is being brought down from his oncologist office today due to a near syncopal event in the bathroom.  Patient states for the last 2 weeks he has had numerous episodes of diarrhea which he states he has had in the past.  He denies any bloody bowel movements or abdominal pain.  He has not had any fever.  Patient states that he can have up to 20 episodes of diarrhea per day.  He has been eating and drinking.  He has started no new medications and has not been on any antibiotics in the last 2 months.  He denies any cough, chest pain or shortness of breath.  Prior to this episode he started feeling hot and lightheaded but denied any palpitations or chest pain.  The history is provided by the patient and medical records.    Past Medical History:  Diagnosis Date  . CHF (congestive heart failure) ([place])   . Chronic kidney disease   . CLL (chronic lymphocytic leukemia) ([place])   . Dyspnea   . History of kidney stones   . Hypertension   . Urolithiasis    cystoscopy in 2002    Patient Active Problem List   Diagnosis Date Noted  . General weakness 09/22/2017  . Generalized abdominal pain 09/22/2017  . Hospital discharge follow-up 07/29/2017  . Chronic congestive heart failure ([place]) 07/29/2017  . Acute combined systolic (congestive) and diastolic (congestive) heart failure ([place]) 07/23/2017  . 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 Itching 06/24/2017  . CLL (chronic lymphocytic leukemia) ([place]) 01/28/2017  . Chronic renal failure, stage 3 (moderate) ([place]) 01/28/2017  .  Nocturia 03/21/2014  . Bilateral leg pain 02/22/2014  . Eustachian tube dysfunction 10/21/2013  . Cough 10/04/2013  . Vision changes 09/27/2013  . Noncompliance with medications 09/27/2013  . Acquired blepharoptosis 04/19/2013  . Gastritis and gastroduodenitis 03/14/2013  . CKD (chronic kidney disease) stage 4, GFR 15-29 ml/min (HCC) 02/11/2010  . Other ganglion and cyst of synovium, tendon, and bursa(727.49) 02/08/2010  . 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 CLL 07/26/2007  . ERECTILE DYSFUNCTION, SECONDARY TO MEDICATION 11/16/2006  . HYPERLIPIDEMIA 10/22/2006  . SCHIZOPHRENIA 10/22/2006  . DEPRESSION, MAJOR, RECURRENT 10/22/2006  . HEARING LOSS NOS OR DEAFNESS 10/22/2006  . Essential hypertension 10/22/2006  . ASCVD 10/22/2006  . OSTEOARTHRITIS, MULTI SITES 10/22/2006    Past Surgical History:  Procedure Laterality Date  . CORONARY ANGIOPLASTY WITH STENT PLACEMENT  02/05/2004   drug eluting to LAD plus baloon 1st diagonal  . INGUINAL HERNIA REPAIR     bilateral  . IR FLUORO GUIDE PORT INSERTION RIGHT  08/26/2017  . IR US GUIDE VASC ACCESS RIGHT  08/26/2017  . KNEE ARTHROSCOPY     right, done in [place]  . UMBILICAL HERNIA REPAIR          Home Medications    Prior to Admission medications   Medication Sig Start Date End Date Taking? Authorizing Provider  amLODipine (NORVASC) 10 MG tablet Take 10 mg by mouth daily. 11/06/17   [provider]  amLODipine (NORVASC) 5 MG tablet Take 5 mg by mouth 2 (two) times daily.    [provider]  furosemide (LASIX) 80 MG tablet Take 80 mg by mouth daily.    [provider]  isosorbide-hydrALAZINE (BIDIL) 20-37.5 MG tablet Take 1 tablet by mouth 3 (three) times daily. 07/24/17   Nyra Market, MD  lidocaine-prilocaine (EMLA) cream Apply 1 application topically as needed. Patient not taking: Reported on 09/22/2017 08/27/17   Josph Macho, MD  LORazepam (ATIVAN) 0.5 MG tablet Take one tablet sublingual 3  times a day x 4 days then every 8 hours as needed for nausea Patient not taking: Reported on 09/22/2017 09/18/17   Josph Macho, MD  metoprolol succinate (TOPROL-XL) 100 MG 24 hr tablet Take 100 mg by mouth daily. 10/07/17   [provider]  ondansetron (ZOFRAN) 4 MG tablet Take one tablet 3 x a day for 4 days then every 8 hours as needed for nausea 09/17/17   Josph Macho, MD  prochlorperazine (COMPAZINE) 10 MG tablet Take 1 tablet (10 mg total) by mouth every 6 (six) hours as needed for nausea or vomiting. Patient not taking: Reported on 10/15/2017 08/27/17   Josph Macho, MD    Family History Family History  Problem Relation Age of Onset  . Diabetes Mother   . Coronary artery disease Mother   . Coronary artery disease Father        died  . Diabetes Father   . Diabetes Brother   . Diabetes Brother        died    Social History Social History   Tobacco Use  . Smoking status: Former Smoker    Years: 31.00    Types: Cigarettes    Last attempt to quit: 12/22/1997    Years since quitting: 19.9  . Smokeless tobacco: Never Used  Substance Use Topics  . Alcohol use: No  . Drug use: No    Comment: remote use of MJ and cocaine     Allergies   Ace inhibitors; Amlodipine besylate; Beta adrenergic blockers; and Hydrochlorothiazide   Review of Systems Review of Systems  All other systems reviewed and are negative.    Physical Exam Updated Vital Signs BP (!) 142/72   Pulse 63   Temp 98.2 F (36.8 C) (Oral)   Resp 15   SpO2 100%   Physical Exam  Constitutional: He is oriented to person, place, and time. He appears well-developed and well-nourished. No distress.  HENT:  Head: Normocephalic and atraumatic.  Mouth/Throat: Oropharynx is clear and moist. Mucous membranes are dry.  Eyes: Pupils are equal, round, and reactive to light. Conjunctivae and EOM are normal.  Neck: Normal range of motion. Neck supple.  Cardiovascular: Normal rate, regular rhythm and  intact distal pulses.  No murmur heard. Pulmonary/Chest: Effort normal and breath sounds normal. No respiratory distress. He has no wheezes. He has no rales.  Abdominal: Soft. He exhibits no distension. There is no tenderness. There is no rebound and no guarding.  Musculoskeletal: Normal range of motion. He exhibits no edema or tenderness.  Neurological: He is alert and oriented to person, place, and time.  Skin: Skin is warm and dry. No rash noted. No erythema. There is pallor.  Poor skin turgor  Psychiatric: He has a normal mood and affect. His behavior is normal.  Nursing note and vitals reviewed.    ED Treatments / Results  Labs (all labs ordered are listed, but only abnormal results are  displayed) Labs Reviewed  CBC WITH DIFFERENTIAL/PLATELET - Abnormal; Notable for the following components:      Result Value   WBC 3.6 (*)    RBC 2.53 (*)    Hemoglobin 7.2 (*)    HCT 22.1 (*)    Neutro Abs 1.3 (*)    Eosinophils Absolute 1.2 (*)    All other components within normal limits  COMPREHENSIVE METABOLIC PANEL - Abnormal; Notable for the following components:   Sodium 134 (*)    CO2 21 (*)    Glucose, Bld 157 (*)    BUN 43 (*)    Creatinine, Ser 3.95 (*)    Calcium 8.8 (*)    Total Protein 5.8 (*)    Albumin 2.6 (*)    ALT 12 (*)    GFR calc non Af Amer 14 (*)    GFR calc Af Amer 16 (*)    All other components within normal limits  URINALYSIS, ROUTINE W REFLEX MICROSCOPIC - Abnormal; Notable for the following components:   Glucose, UA 100 (*)    Hgb urine dipstick TRACE (*)    Protein, ur >300 (*)    All other components within normal limits  URINALYSIS, MICROSCOPIC (REFLEX) - Abnormal; Notable for the following components:   Bacteria, UA RARE (*)    All other components within normal limits  I-STAT CG4 LACTIC ACID, ED    EKG None  Radiology No results found.  Procedures Procedures (including critical care time)  Medications Ordered in ED Medications  sodium  chloride 0.9 % bolus 2,000 mL (2,000 mLs Intravenous New Bag/Given 11/19/17 1115)     Initial Impression / Assessment and Plan / ED Course  I have reviewed the triage vital signs and the nursing notes.  Pertinent labs & imaging results that were available during my care of the patient were reviewed by me and considered in my medical decision making (see chart for details).     Patient is an elderly gentleman with multiple medical problems currently on chemotherapy for CLL who is presenting today with a near syncopal event while at his oncologist office upstairs.  Patient has been having 2 weeks of diarrhea but denies any fever, antibiotics or symptoms that would be concerning for infectious diarrhea or C. difficile.  Patient has no abdominal pain, fever or symptoms concerning for diverticulitis, peritonitis or perforation.  Patient denies any chest pain or shortness of breath and low suspicion that patient symptoms today were cardiac in nature.  Feel most likely patient is dehydrated from the amount of diarrhea he has had.  Labs are reassuring without acute changes and seems to be at baseline.  After 2 L of fluid patient feels much better.  Ambulated without difficulty and will discharge home with his son.  Patient also given Imodium.  Final Clinical Impressions(s) / ED Diagnoses   Final diagnoses:  Dehydration  Diarrhea, unspecified type  Near syncope    ED Discharge Orders        Ordered    loperamide (IMODIUM) 2 MG capsule  4 times daily PRN     11/19/17 1317       Blanchie Dessert, MD 11/19/17 1317

## 2017-11-19 NOTE — Patient Instructions (Signed)

## 2017-11-19 NOTE — Progress Notes (Signed)
Patient exited exam room to go to the bathroom while waiting to see Dr. Marin Olp, when he became lethargic and unresponsive.  Patient assisted to chair.  Dr. Marin Olp and S[place] NP to immediately assess patient. O2@ 2L [place] applied. VS stable.  Patient remains lethargic, non verbal with eyes open.  Order received from Dr. Marin Olp to transfer pt to the ED.  Dr. Marin Olp called report to the ED. Translator with patient and called and spoke with pt.'s son Mikeal Hawthorne to inform him of patient's condition.  Pt assisted to ED via w/c and O2 with assist of RN and translator.

## 2017-11-19 NOTE — Progress Notes (Signed)
Hematology and Oncology Follow Up Visit  Nathan Richards [id number] 1945/11/09 72 y.o. 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  He has an interpreter with him.  Unfortunately, he is not doing well at all.  He apparently has been having diarrhea for the past couple weeks.  He says that he has been taking some Imodium.  He is incredibly weak.  He basically passed out in the office.  As such, we have had to get him downstairs to the emergency room.  All of his vital signs were stable when he passed out.  His oxygen saturation was 100%.  His blood pressure was adequate.  His heart rate was 65.  We will have to likely see him in the hospital.  I would hope that this is just dehydration.    Overall, his performance status is ECOG 3.    Medications:  Current Outpatient Medications:  .  amLODipine (NORVASC) 10 MG tablet, Take 10 mg by mouth daily., Disp: , Rfl: 3 .  amLODipine (NORVASC) 5 MG tablet, Take 5 mg by mouth 2 (two) times daily., Disp: , Rfl:  .  furosemide (LASIX) 80 MG tablet, Take 80 mg by mouth daily., Disp: , Rfl:  .  isosorbide-hydrALAZINE (BIDIL) 20-37.5 MG tablet, Take 1 tablet by mouth 3 (three) times daily., Disp: 90 tablet, Rfl: 2 .  lidocaine-prilocaine (EMLA) cream, Apply 1 application topically as needed. (Patient not taking: Reported on 09/22/2017), Disp: 30 g, Rfl: 2 .  loperamide (IMODIUM) 2 MG capsule, Take 1 capsule (2 mg total) by mouth 4 (four) times daily as needed for diarrhea or loose stools., Disp: 12 capsule, Rfl: 0 .  LORazepam (ATIVAN) 0.5 MG tablet, Take one tablet sublingual 3 times a day x 4 days then every 8 hours as needed for nausea (Patient not taking: Reported on 09/22/2017), Disp: 60 tablet, Rfl: 0 .  metoprolol succinate (TOPROL-XL)  100 MG 24 hr tablet, Take 100 mg by mouth daily., Disp: , Rfl: 1 .  ondansetron (ZOFRAN) 4 MG tablet, Take one tablet 3 x a day for 4 days then every 8 hours as needed for nausea, Disp: 60 tablet, Rfl: 2 .  prochlorperazine (COMPAZINE) 10 MG tablet, Take 1 tablet (10 mg total) by mouth every 6 (six) hours as needed for nausea or vomiting. (Patient not taking: Reported on 10/15/2017), Disp: 30 tablet, Rfl: 1  Allergies:  Allergies  Allergen Reactions  . Ace Inhibitors     REACTION: cough  . Amlodipine Besylate     REACTION: Impotence  . Beta Adrenergic Blockers     REACTION: Impotence  . Hydrochlorothiazide Other (See Comments)    May have contributed to his hypercalcemia    Past Medical History, Surgical history, Social history, and Family History were reviewed and updated.  Review of Systems: Review of Systems  Constitutional: Positive for malaise/fatigue.  HENT: Negative.   Eyes: Negative.   Respiratory: Negative.   Cardiovascular: Positive for leg swelling.  Gastrointestinal: Positive for vomiting.  Genitourinary: Negative.   Musculoskeletal: Negative.   Skin: Negative.   Neurological: Negative.   Endo/Heme/Allergies: Negative.   Psychiatric/Behavioral: Negative.      Wt Readings from Last 3 Encounters:  11/19/17 118 lb (53.5 kg)  11/18/17 119 lb (54 kg)  10/29/17 127 lb (57.6 kg)     Physical Exam  Constitutional: He is oriented to person, place, and time.  HENT:  Head: Normocephalic and atraumatic.  Mouth/Throat: Oropharynx is clear and moist.  Eyes: Pupils are equal, round, and reactive to light. EOM are normal.  Neck: Normal range of motion.  Cardiovascular: Normal rate, regular rhythm and normal heart sounds.  Pulmonary/Chest: Effort normal and breath sounds normal.  Abdominal: Soft. Bowel sounds are normal.  Musculoskeletal: Normal range of motion. He exhibits no edema, tenderness or deformity.  Lymphadenopathy:    He has no cervical adenopathy.    Neurological: He is alert and oriented to person, place, and time.  Skin: Skin is warm and dry. No rash noted. No erythema.  Psychiatric: He has a normal mood and affect. His behavior is normal. Judgment and thought content normal.  Vitals reviewed.    Lab Results  Component Value Date   WBC 3.6 (L) 11/19/2017   HGB 7.2 (L) 11/19/2017   HCT 22.1 (L) 11/19/2017   MCV 87.4 11/19/2017   PLT 207 11/19/2017     Chemistry      Component Value Date/Time   NA 134 (L) 11/19/2017 1038   NA 139 08/27/2017 0751   K 3.6 11/19/2017 1038   K 3.7 08/27/2017 0751   CL 105 11/19/2017 1038   CL 106 08/27/2017 0751   CO2 21 (L) 11/19/2017 1038   CO2 19 08/27/2017 0751   BUN 43 (H) 11/19/2017 1038   BUN 60 (H) 08/27/2017 0751   CREATININE 3.95 (H) 11/19/2017 1038   CREATININE 3.70 (HH) 11/19/2017 0925   CREATININE 3.4 (HH) 08/27/2017 0751      Component Value Date/Time   CALCIUM 8.8 (L) 11/19/2017 1038   CALCIUM 8.4 08/27/2017 0751   ALKPHOS 80 11/19/2017 1038   ALKPHOS 109 (H) 08/27/2017 0751   AST 15 11/19/2017 1038   AST 15 11/19/2017 0925   ALT 12 (L) 11/19/2017 1038   ALT 15 11/19/2017 0925   ALT 15 08/27/2017 0751   BILITOT 0.6 11/19/2017 1038   BILITOT 0.6 11/19/2017 0925         Impression and Plan: Nathan Richards is a 72 year old Hispanic male. He has renal insufficiency. This is secondary to his CLL.  For right now, we will hold his treatments.  We will have to see what happens with respect to his clinical status.  If he is admitted, we will see him in the hospital.  I do not think what is going on is related to his treatment or to his CLL.  Hopefully, we will be able to get him back to see [place] in about a month.   Volanda Napoleon, MD 3/28/20194:11 PM

## 2017-11-20 ENCOUNTER — Other Ambulatory Visit: Payer: Self-pay | Admitting: *Deleted

## 2017-11-20 LAB — IGG, IGA, IGM
IGA: 159 mg/dL (ref 61–437)
IGG (IMMUNOGLOBIN G), SERUM: 1029 mg/dL (ref 700–1600)
IgM (Immunoglobulin M), Srm: 96 mg/dL (ref 20–172)

## 2017-11-20 LAB — KAPPA/LAMBDA LIGHT CHAINS
Kappa free light chain: 91 mg/L — ABNORMAL HIGH (ref 3.3–19.4)
Kappa, lambda light chain ratio: 0.53 (ref 0.26–1.65)
Lambda free light chains: 171.1 mg/L — ABNORMAL HIGH (ref 5.7–26.3)

## 2017-11-20 NOTE — Patient Outreach (Signed)
[facility]) Care Management  11/20/2017  Eliel Dudding 06-12-1946 [id number]   RN able to verify [facility] able to have [place] interpreters and made a referral for pt for ongoing services. RN spoke with Manus Gunning and explained that pt's provider as been informed that pt has agreed to [facility] services. RN will await confirmation prior to closing this case for another case management services that will be able to provider additional services for this pt.  After RN last home visit and call to the provider's office pt visited the ED for 3 hrs due to dehydration, near syncope and weakness where pt was discharged home. Will follow up accordingly concerning pt's ongoing recovery.   Raina Mina, RN Care Management Coordinator [place] Office [phone]

## 2017-11-23 ENCOUNTER — Encounter: Payer: Self-pay | Admitting: *Deleted

## 2017-11-23 ENCOUNTER — Other Ambulatory Visit: Payer: Self-pay | Admitting: *Deleted

## 2017-11-23 NOTE — Patient Outreach (Addendum)
[facility]) Care Management  11/23/2017  Nathan Richards Apr 26, 1946 [id number]    Telephone Call (ED 3/28) Spanish interpreter Verdis Frederickson [phone]  RN spoke with pt today confirmed identifiers. RN followed up with his last ED visit as pt indicates he is doing a lot better with a weight of 119 lbs today with minimal swelling to his LE. Pt also confirmed he received a call today from [place] who will be visiting on Wednesday for services. Based upon this information RN will discharge pt from[facility] services due to another involved case management services available to this pt and alert pt's provider of pt disposition with [facility] services. Pt verbalized an understanding with no additional request or inquires at this time.  Case will be closed.  Raina Mina, RN Care Management Coordinator [place] Office [phone]

## 2017-11-24 ENCOUNTER — Telehealth: Payer: Self-pay

## 2017-11-24 LAB — PROTEIN ELECTROPHORESIS, SERUM, WITH REFLEX
A/G RATIO SPE: 0.9 (ref 0.7–1.7)
ALBUMIN ELP: 2.7 g/dL — AB (ref 2.9–4.4)
ALPHA-2-GLOBULIN: 1 g/dL (ref 0.4–1.0)
Alpha-1-Globulin: 0.3 g/dL (ref 0.0–0.4)
BETA GLOBULIN: 0.7 g/dL (ref 0.7–1.3)
GLOBULIN, TOTAL: 3 g/dL (ref 2.2–3.9)
Gamma Globulin: 1 g/dL (ref 0.4–1.8)
M-Spike, %: 0.4 g/dL — ABNORMAL HIGH
SPEP Interpretation: 0
Total Protein ELP: 5.7 g/dL — ABNORMAL LOW (ref 6.0–8.5)

## 2017-11-24 NOTE — Telephone Encounter (Signed)
Copied from [place] [id number]. Topic: Referral - Request >> Nov 24, 2017  2:45 PM Vernona Rieger wrote: Manus Gunning from [facility] would like [place] the nurse to call her back in regards to this so she can explain some things. Please call her at [phone]

## 2017-11-24 NOTE — Telephone Encounter (Signed)
Please advise.   Copied from [place] [phone]. Topic: Referral - Request >> Nov 23, 2017  2:43 PM Vernona Rieger wrote: Reason for CRM: [facility] G[facility] called and said that he was referred to them and she wants to know if Dr Mitchel Honour will be the attending for palliative care. Call back is [phone]

## 2017-11-24 NOTE — Telephone Encounter (Signed)
Probably should be the Doctor who referred him. I don't feel comfortable handling hospice palliative care.

## 2017-11-24 NOTE — Telephone Encounter (Signed)
>>   Nov 24, 2017  3:40 PM Vernona Rieger wrote: Raina Mina from [facility] called in regards to the same situation. She said that no other dr referred him to Dr Mitchel Honour. She said that she is the nurse that put in the referral as the patient needs palliative care to help him get better. Dr Mitchel Honour is the only doctor listed for this patient. She has released the patient from her care thinking that he would be under the care connection services.  Her best contact is [phone]

## 2017-11-25 NOTE — Telephone Encounter (Signed)
I will gladly help [place] as much as I can but his care at this point should be directed by someone experienced in palliative hospice care. Perhaps the Oncologist who has been actively seeing him the past several months.

## 2017-11-25 NOTE — Telephone Encounter (Signed)
Phone call to [address]. Relayed message from provider. She states she will reach out to patient's oncologist.

## 2017-12-01 ENCOUNTER — Other Ambulatory Visit: Payer: Self-pay | Admitting: *Deleted

## 2017-12-01 NOTE — Patient Outreach (Signed)
[facility]) Care Management  12/01/2017  Yosgart Pavey [birth year] [id number]   Spanish interpreter Hall Busing VJ#[id number]  RN spoke with pt today and reintroduced [facility] and purpose for today's call. Reiterated on the previous services rendered over the last few months and offered to reactivate pt due to the delay for Care Connection involvement. Pt agreed and requested a home visit for this month. Scheduled a home visit for next week and will requested a visiting interpreter for that home visit. RN regenerated a plan of care that was discussed with goals and interventions around HF. Pt receptive and was very pleased that [place] would be visiting once again. RN strongly encouraged pt to weight daily and continue to document all readings. Will verify pt is in the GREEN zone today and continue educating pt on his HF and how to best manage this condition. Pt very grateful for the call today and pleased with the above arrangements. Will follow up accordingly.  Raina Mina, RN Care Management Coordinator [place] Office [phone]

## 2017-12-10 ENCOUNTER — Inpatient Hospital Stay: Payer: Medicare Other | Attending: Hematology & Oncology | Admitting: Hematology & Oncology

## 2017-12-10 ENCOUNTER — Inpatient Hospital Stay: Payer: Medicare Other

## 2017-12-10 ENCOUNTER — Telehealth: Payer: Self-pay | Admitting: *Deleted

## 2017-12-10 ENCOUNTER — Other Ambulatory Visit: Payer: Self-pay

## 2017-12-10 VITALS — BP 148/74 | HR 74 | Temp 98.3°F | Resp 16

## 2017-12-10 DIAGNOSIS — D631 Anemia in chronic kidney disease: Secondary | ICD-10-CM

## 2017-12-10 DIAGNOSIS — Z79899 Other long term (current) drug therapy: Secondary | ICD-10-CM | POA: Diagnosis not present

## 2017-12-10 DIAGNOSIS — N289 Disorder of kidney and ureter, unspecified: Secondary | ICD-10-CM

## 2017-12-10 DIAGNOSIS — N184 Chronic kidney disease, stage 4 (severe): Secondary | ICD-10-CM

## 2017-12-10 DIAGNOSIS — C9110 Chronic lymphocytic leukemia of B-cell type not having achieved remission: Secondary | ICD-10-CM

## 2017-12-10 DIAGNOSIS — Z5111 Encounter for antineoplastic chemotherapy: Secondary | ICD-10-CM | POA: Diagnosis not present

## 2017-12-10 LAB — CBC WITH DIFFERENTIAL (CANCER CENTER ONLY)
Basophils Absolute: 0.1 10*3/uL (ref 0.0–0.1)
Basophils Relative: 1 %
Eosinophils Absolute: 3.8 10*3/uL — ABNORMAL HIGH (ref 0.0–0.5)
Eosinophils Relative: 40 %
HEMATOCRIT: 22.1 % — AB (ref 38.7–49.9)
Hemoglobin: 7.4 g/dL — ABNORMAL LOW (ref 13.0–17.1)
LYMPHS ABS: 2.5 10*3/uL (ref 0.9–3.3)
Lymphocytes Relative: 26 %
MCH: 29.2 pg (ref 28.0–33.4)
MCHC: 33.5 g/dL (ref 32.0–35.9)
MCV: 87.4 fL (ref 82.0–98.0)
MONO ABS: 0.2 10*3/uL (ref 0.1–0.9)
MONOS PCT: 2 %
NEUTROS ABS: 2.9 10*3/uL (ref 1.5–6.5)
Neutrophils Relative %: 31 %
Platelet Count: 119 10*3/uL — ABNORMAL LOW (ref 145–400)
RBC: 2.53 MIL/uL — ABNORMAL LOW (ref 4.20–5.70)
RDW: 16.1 % — ABNORMAL HIGH (ref 11.1–15.7)
WBC Count: 9.4 10*3/uL (ref 4.0–10.0)

## 2017-12-10 LAB — IRON AND TIBC
IRON: 66 ug/dL (ref 42–163)
SATURATION RATIOS: 32 % — AB (ref 42–163)
TIBC: 204 ug/dL (ref 202–409)
UIBC: 138 ug/dL

## 2017-12-10 LAB — CMP (CANCER CENTER ONLY)
ALK PHOS: 102 U/L — AB (ref 26–84)
ALT: 22 U/L (ref 10–47)
ANION GAP: 13 (ref 5–15)
AST: 21 U/L (ref 11–38)
Albumin: 3 g/dL — ABNORMAL LOW (ref 3.5–5.0)
BILIRUBIN TOTAL: 0.6 mg/dL (ref 0.2–1.6)
BUN: 49 mg/dL — ABNORMAL HIGH (ref 7–22)
CHLORIDE: 109 mmol/L — AB (ref 98–108)
CO2: 21 mmol/L (ref 18–33)
CREATININE: 4 mg/dL — AB (ref 0.60–1.20)
Calcium: 9.1 mg/dL (ref 8.0–10.3)
Glucose, Bld: 114 mg/dL (ref 73–118)
POTASSIUM: 3.8 mmol/L (ref 3.3–4.7)
Sodium: 143 mmol/L (ref 128–145)
Total Protein: 6.7 g/dL (ref 6.4–8.1)

## 2017-12-10 LAB — LACTATE DEHYDROGENASE: LDH: 207 U/L (ref 125–245)

## 2017-12-10 LAB — FERRITIN: FERRITIN: 236 ng/mL (ref 22–316)

## 2017-12-10 LAB — RETICULOCYTES
RBC.: 2.6 MIL/uL — ABNORMAL LOW (ref 4.20–5.82)
RETIC COUNT ABSOLUTE: 49.4 10*3/uL (ref 34.8–93.9)
Retic Ct Pct: 1.9 % — ABNORMAL HIGH (ref 0.8–1.8)

## 2017-12-10 MED ORDER — ACETAMINOPHEN 325 MG PO TABS
ORAL_TABLET | ORAL | Status: AC
Start: 2017-12-10 — End: 2017-12-10
  Filled 2017-12-10: qty 2

## 2017-12-10 MED ORDER — HEPARIN SOD (PORK) LOCK FLUSH 100 UNIT/ML IV SOLN
500.0000 [IU] | Freq: Once | INTRAVENOUS | Status: AC | PRN
  Administered 2017-12-10: 500 [IU]
  Filled 2017-12-10: qty 5

## 2017-12-10 MED ORDER — SODIUM CHLORIDE 0.9 % IV SOLN
800.0000 mg/m2 | Freq: Once | INTRAVENOUS | Status: AC
  Administered 2017-12-10: 1240 mg via INTRAVENOUS
  Filled 2017-12-10: qty 62

## 2017-12-10 MED ORDER — SODIUM CHLORIDE 0.9 % IV SOLN
Freq: Once | INTRAVENOUS | Status: AC
  Administered 2017-12-10: 10:00:00 via INTRAVENOUS

## 2017-12-10 MED ORDER — PALONOSETRON HCL INJECTION 0.25 MG/5ML
0.2500 mg | Freq: Once | INTRAVENOUS | Status: AC
  Administered 2017-12-10: 0.25 mg via INTRAVENOUS

## 2017-12-10 MED ORDER — VINCRISTINE SULFATE CHEMO INJECTION 1 MG/ML
2.0000 mg | Freq: Once | INTRAVENOUS | Status: AC
  Administered 2017-12-10: 2 mg via INTRAVENOUS
  Filled 2017-12-10: qty 2

## 2017-12-10 MED ORDER — SODIUM CHLORIDE 0.9% FLUSH
3.0000 mL | INTRAVENOUS | Status: DC | PRN
  Filled 2017-12-10: qty 10

## 2017-12-10 MED ORDER — PALONOSETRON HCL INJECTION 0.25 MG/5ML
INTRAVENOUS | Status: AC
  Filled 2017-12-10: qty 5

## 2017-12-10 MED ORDER — SODIUM CHLORIDE 0.9 % IV SOLN
375.0000 mg/m2 | Freq: Once | INTRAVENOUS | Status: AC
  Administered 2017-12-10: 600 mg via INTRAVENOUS
  Filled 2017-12-10: qty 50

## 2017-12-10 MED ORDER — DIPHENHYDRAMINE HCL 25 MG PO CAPS
50.0000 mg | ORAL_CAPSULE | Freq: Once | ORAL | Status: AC
  Administered 2017-12-10: 50 mg via ORAL

## 2017-12-10 MED ORDER — DIPHENHYDRAMINE HCL 25 MG PO CAPS
ORAL_CAPSULE | ORAL | Status: AC
  Filled 2017-12-10: qty 2

## 2017-12-10 MED ORDER — SODIUM CHLORIDE 0.9% FLUSH
10.0000 mL | INTRAVENOUS | Status: DC | PRN
  Administered 2017-12-10: 10 mL
  Filled 2017-12-10: qty 10

## 2017-12-10 MED ORDER — ACETAMINOPHEN 325 MG PO TABS
650.0000 mg | ORAL_TABLET | Freq: Once | ORAL | Status: AC
  Administered 2017-12-10: 650 mg via ORAL

## 2017-12-10 MED ORDER — SODIUM CHLORIDE 0.9 % IV SOLN
30.0000 mg | Freq: Once | INTRAVENOUS | Status: AC
  Administered 2017-12-10: 30 mg via INTRAVENOUS
  Filled 2017-12-10: qty 3

## 2017-12-10 NOTE — Telephone Encounter (Signed)
Critical Value Creatinine 4.0 Dr Marin Olp notified. No orders at this time.

## 2017-12-10 NOTE — Patient Instructions (Addendum)
Quimioterapia (Chemotherapy) La quimioterapia es el uso de medicamentos para TEFL teacher o lentificar el crecimiento de clulas cancerosas. Segn el tipo y el estadio del cncer, puede recibir quimioterapia para:  Games developer.  Lentificar el progreso del cncer.  Aliviar los sntomas del cncer.  Mejorar los beneficios del tratamiento de radiacin.  Reducir el tamao del tumor antes de la [place].  Eliminar las clulas cancerosas que permanecen en el cuerpo despus de que el tumor se haya extirpado. CMO SE ADMINISTRA LA Javier Glazier? La quimioterapia se puede administrar de las siguientes maneras:  Por boca, en forma de lquido o comprimido.  A travs de un tubo delgado que se coloca en una vena o arteria.  Mediante una inyeccin.  Mediante la aplicacin de crema o ungento en la piel.  A travs de lquidos que se administran directamente en varias reas del cuerpo, como el abdomen, el pecho o la vejiga. CON QU FRECUENCIA SE ADMINISTRA LA QUIMIOTERAPIA? La quimioterapia puede administrarse de [place] continua a lo largo de un periodo de tiempo o en ciclos. Por ejemplo, puede tomar el medicamento durante una semana por mes. DURANTE CUNTO TIEMPO NECESITAR EL TRATAMIENTO DE QUIMIOTERAPIA? La duracin del tratamiento depende de muchos factores, entre ellos:  El tipo de Database administrator.  Si el cncer se ha diseminado.  Cmo responde a la quimioterapia.  Si desarrolla efectos secundarios. Algunos tipos de medicamentos para la quimioterapia se administran solo Building control surveyor. Otros se indican durante meses, aos o de por vida. QU PRECAUCIONES DE SEGURIDAD DEBO TOMAR MIENTRAS RECIBO LA QUIMIOTERAPIA? Los medicamentos para la quimioterapia son muy fuertes. Estarn presentes en todos los lquidos corporales, como la orina, la materia fecal, la saliva, el sudor, las lgrimas, las secreciones vaginales y Lexicographer. Debe seguir cuidadosamente algunas precauciones de seguridad para evitar daar a  Engineer, site tome estos medicamentos. A continuacin se mencionan las precauciones recomendadas:  Asegrese de que las personas que cuidan de usted usen guantes desechables si entrarn en contacto con cualquiera de los lquidos corporales. Las mujeres embarazadas o que amamantan no deben manipular ninguno de los lquidos corporales.  Lave la ropa, las toallas y las sbanas que puedan contener lquidos [employer] veces en el lavarropas con agua muy caliente.  Deseche paales de adultos, tampones y toallas sanitarias colocndolos primero en una bolsa de plstico.  Use preservativo cuando mantenga relaciones sexuales durante al menos 2 semanas despus de recibir la quimioterapia.  No comparta bebidas ni alimentos.  Mantenga los medicamentos de la quimioterapia en sus envases originales. Gurdelos en un lugar alto y [place], fuera del alcance de los nios. No los exponga al calor ni a la humedad. No los coloque en envases con otros tipos de medicamentos.  Deseche los envoltorios de sus medicamentos de quimioterapia colocndolos en una bolsa plstica por separado.  No deseche los medicamentos sobrantes ni los tire en el inodoro. Lleve los medicamentos que no usar al [facility] mdico donde se podrn Technical brewer de [place] apropiada.  Siga las indicaciones del mdico para la eliminacin apropiada de agujas, vas intravenosas y otros materiales sanitarios que hayan entrado en contacto con los medicamentos para la quimioterapia.  Si se le proporciona un contenedor de Cytogeneticist, asegrese de comprender las instrucciones de [place].  Lvese bien las manos con agua tibia y jabn despus de usar el bao. Squese las manos con toallas de papel desechables.  Cuando use el inodoro: ? Sports administrator correr [employer] despus de cada uso, incluso despus de vomitar. ?  Cierre la tapa del inodoro antes de Field seismologist correr Wellsite geologist. Esto ayuda a evitar salpicaduras. ? Los hombres y las mujeres deben  sentarse para usar el inodoro. Esto ayuda a evitar salpicaduras. [place]? Los efectos secundarios dependen de varios factores, entre ellos:  El tipo especfico de medicamento para quimioterapia utilizado.  La dosis.  La duracin del tratamiento.  El [place] de salud general. Algunos de los efectos secundarios que puede presentar son los siguientes:  Fatiga y disminucin de Sales promotion account executive.  Prdida del apetito.  Cambios en el sentido del olfato y [place].  Nuseas.  Vmitos.  Estreimiento o diarrea.  Prdida del cabello.  Mayor propensin a las infecciones.  Fcil sangrado.  Llagas en la boca.  Quemazn u hormigueo de las manos y los pies.  Problemas de memoria. Esta informacin no tiene Marine scientist el consejo del mdico. Asegrese de hacerle al mdico cualquier pregunta que tenga. Document Released: 11/27/2008 Document Revised: 09/01/2014 Document Reviewed: 01/17/2014 Elsevier Interactive Patient Education  2018 [employer].

## 2017-12-10 NOTE — Progress Notes (Signed)
OK to treat with today's lab values per Dr. Ennever. 

## 2017-12-10 NOTE — Progress Notes (Signed)
Hematology and Oncology Follow Up Visit  Nathan Richards [id number] 1946-07-17 72 y.o. 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  He has an interpreter with him.  Thankfully, he is doing much better than last time he was here.  Last time he was here, he was quite dehydrated.  He passed out.  He went down to the emergency room.  He was given IV fluids.  He has recovered quite nicely.  His weight is down.  I told him if he keeps losing weight, then his kidney function is going to worsen and he could easily end up on dialysis.  His blood pressure is back on the high side.  I am still not sure if he is taking his blood pressure medications.  Through the interpreter, I told him what he must take.  He must take all of his blood pressure medications in order to keep his blood pressure under better control.  He says he is eating better.  He has had no nausea or vomiting.  He has had no obvious bleeding.  He has had no diarrhea.  He has had some leg swelling.  I think a lot of this is from his anemia.  Because of his elevated high blood pressure, I really cannot give him any Aranesp today.  Overall, his performance status is ECOG 1.   Medications:  Current Outpatient Medications:  .  amLODipine (NORVASC) 10 MG tablet, Take 10 mg by mouth daily., Disp: , Rfl: 3 .  amLODipine (NORVASC) 5 MG tablet, Take 5 mg by mouth 2 (two) times daily., Disp: , Rfl:  .  furosemide (LASIX) 80 MG tablet, Take 80 mg by mouth daily., Disp: , Rfl:  .  isosorbide-hydrALAZINE (BIDIL) 20-37.5 MG tablet, Take 1 tablet by mouth 3 (three) times daily., Disp: 90 tablet, Rfl: 2 .  lidocaine-prilocaine (EMLA) cream, Apply 1 application topically as needed. (Patient not taking: Reported on 09/22/2017), Disp: 30  g, Rfl: 2 .  loperamide (IMODIUM) 2 MG capsule, Take 1 capsule (2 mg total) by mouth 4 (four) times daily as needed for diarrhea or loose stools., Disp: 12 capsule, Rfl: 0 .  LORazepam (ATIVAN) 0.5 MG tablet, Take one tablet sublingual 3 times a day x 4 days then every 8 hours as needed for nausea (Patient not taking: Reported on 09/22/2017), Disp: 60 tablet, Rfl: 0 .  metoprolol succinate (TOPROL-XL) 100 MG 24 hr tablet, Take 100 mg by mouth daily., Disp: , Rfl: 1 .  ondansetron (ZOFRAN) 4 MG tablet, Take one tablet 3 x a day for 4 days then every 8 hours as needed for nausea, Disp: 60 tablet, Rfl: 2 .  prochlorperazine (COMPAZINE) 10 MG tablet, Take 1 tablet (10 mg total) by mouth every 6 (six) hours as needed for nausea or vomiting. (Patient not taking: Reported on 10/15/2017), Disp: 30 tablet, Rfl: 1  Allergies:  Allergies  Allergen Reactions  . Ace Inhibitors     REACTION: cough  . Amlodipine Besylate     REACTION: Impotence  . Beta Adrenergic Blockers     REACTION: Impotence  . Hydrochlorothiazide Other (See Comments)    May have contributed to his hypercalcemia    Past Medical History, Surgical history, Social history,  and Family History were reviewed and updated.  Review of Systems: Review of Systems  Constitutional: Positive for malaise/fatigue.  HENT: Negative.   Eyes: Negative.   Respiratory: Negative.   Cardiovascular: Positive for leg swelling.  Gastrointestinal: Positive for vomiting.  Genitourinary: Negative.   Musculoskeletal: Negative.   Skin: Negative.   Neurological: Negative.   Endo/Heme/Allergies: Negative.   Psychiatric/Behavioral: Negative.      Wt Readings from Last 3 Encounters:  12/10/17 118 lb (53.5 kg)  11/19/17 118 lb (53.5 kg)  11/18/17 119 lb (54 kg)     Physical Exam  Constitutional: He is oriented to person, place, and time.  HENT:  Head: Normocephalic and atraumatic.  Mouth/Throat: Oropharynx is clear and moist.  Eyes: Pupils are  equal, round, and reactive to light. EOM are normal.  Neck: Normal range of motion.  Cardiovascular: Normal rate, regular rhythm and normal heart sounds.  Pulmonary/Chest: Effort normal and breath sounds normal.  Abdominal: Soft. Bowel sounds are normal.  Musculoskeletal: Normal range of motion. He exhibits no edema, tenderness or deformity.  Lymphadenopathy:    He has no cervical adenopathy.  Neurological: He is alert and oriented to person, place, and time.  Skin: Skin is warm and dry. No rash noted. No erythema.  Psychiatric: He has a normal mood and affect. His behavior is normal. Judgment and thought content normal.  Vitals reviewed.    Lab Results  Component Value Date   WBC 9.4 12/10/2017   HGB 7.2 (L) 11/19/2017   HCT 22.1 (L) 12/10/2017   MCV 87.4 12/10/2017   PLT 119 (L) 12/10/2017     Chemistry      Component Value Date/Time   NA 134 (L) 11/19/2017 1038   NA 139 08/27/2017 0751   K 3.6 11/19/2017 1038   K 3.7 08/27/2017 0751   CL 105 11/19/2017 1038   CL 106 08/27/2017 0751   CO2 21 (L) 11/19/2017 1038   CO2 19 08/27/2017 0751   BUN 43 (H) 11/19/2017 1038   BUN 60 (H) 08/27/2017 0751   CREATININE 3.95 (H) 11/19/2017 1038   CREATININE 3.70 (HH) 11/19/2017 0925   CREATININE 3.4 (HH) 08/27/2017 0751      Component Value Date/Time   CALCIUM 8.8 (L) 11/19/2017 1038   CALCIUM 8.4 08/27/2017 0751   ALKPHOS 80 11/19/2017 1038   ALKPHOS 109 (H) 08/27/2017 0751   AST 15 11/19/2017 1038   AST 15 11/19/2017 0925   ALT 12 (L) 11/19/2017 1038   ALT 15 11/19/2017 0925   ALT 15 08/27/2017 0751   BILITOT 0.6 11/19/2017 1038   BILITOT 0.6 11/19/2017 0925         Impression and Plan: Nathan Richards is a 72 year old Hispanic male. He has renal insufficiency. This is secondary to his CLL.  We will go ahead with his treatment.  This will be his fifth cycle of treatment.  I will hold on transfusing him.  Again, if his blood pressure was little bit better, we probably  could go with Aranesp.  His iron studies are looking okay.  We will plan to get him back in another 3 weeks.  Unfortunately, we really cannot change treatments on him right now because of his renal insufficiency.  I would like to try bendamustine but given his renal insufficiency, I really cannot use this agent.  As always, the interpreter was incredibly helpful resource.  She is always so nice and friendly.    Volanda Napoleon, MD 4/18/20199:12 AM

## 2017-12-11 ENCOUNTER — Other Ambulatory Visit: Payer: Self-pay | Admitting: *Deleted

## 2017-12-11 ENCOUNTER — Telehealth: Payer: Self-pay | Admitting: Emergency Medicine

## 2017-12-11 LAB — KAPPA/LAMBDA LIGHT CHAINS
KAPPA, LAMDA LIGHT CHAIN RATIO: 0.81 (ref 0.26–1.65)
Kappa free light chain: 112.3 mg/L — ABNORMAL HIGH (ref 3.3–19.4)
Lambda free light chains: 137.8 mg/L — ABNORMAL HIGH (ref 5.7–26.3)

## 2017-12-11 LAB — IGG, IGA, IGM
IgA: 155 mg/dL (ref 61–437)
IgG (Immunoglobin G), Serum: 1253 mg/dL (ref 700–1600)
IgM (Immunoglobulin M), Srm: 135 mg/dL (ref 20–172)

## 2017-12-11 NOTE — Patient Outreach (Signed)
[facility]) Care Management   12/11/2017  Nathan Richards [birth year] [id number]  Nathan Richards is an 72 y.o. male  Subjective:  HF: Pt reports he continue to weight everyday with some ongoing swelling to his lower legs. Pt states he elevates his legs and they are less swollen in the morning upon arriving. Pt denies any symptoms of HF and aware of some of the symptoms. Once reviewed pt able to indicate he is in the GREEN zone other then the swelling to his LE but admits he has not taken his Lasix today. MEDICATION: Pt states he has taken his Metoprolol in a few months but the Cancer center reports his BP as good. Pt did not have any refills and did not know the pharmacy could have contacted his provider to request more refills.  Pt also has not taken his Lasix for today until this RN arrived. Pt states he has enough refills on all other medications.   Objective:   Review of Systems  Constitutional: Negative.   HENT: Negative.   Eyes: Negative.   Respiratory: Negative.   Cardiovascular: Positive for leg swelling.       Bilateral swelling at 1+ to LE.  Gastrointestinal: Negative.   Genitourinary: Negative.   Musculoskeletal: Negative.   Skin: Negative.   Neurological: Negative.   Endo/Heme/Allergies: Negative.   Psychiatric/Behavioral: Negative.     Physical Exam  Constitutional: He is oriented to person, place, and time. He appears well-developed and well-nourished.  Eyes: EOM are normal.  Neck: Normal range of motion. Neck supple.  Cardiovascular: Normal heart sounds.  Respiratory: Effort normal and breath sounds normal.  GI: Soft. Bowel sounds are normal.  Musculoskeletal: He exhibits edema.  Bilateral edema at 1+ to LE   Neurological: He is alert and oriented to person, place, and time.  Skin: Skin is warm and dry.  Psychiatric: He has a normal mood and affect. His behavior is normal. Judgment and thought content normal.    Encounter Medications:   Outpatient  Encounter Medications as of 12/11/2017  Medication Sig  . amLODipine (NORVASC) 10 MG tablet Take 10 mg by mouth daily.  Marland Kitchen amLODipine (NORVASC) 5 MG tablet Take 5 mg by mouth 2 (two) times daily.  . furosemide (LASIX) 80 MG tablet Take 80 mg by mouth daily.  . isosorbide-hydrALAZINE (BIDIL) 20-37.5 MG tablet Take 1 tablet by mouth 3 (three) times daily.  Marland Kitchen lidocaine-prilocaine (EMLA) cream Apply 1 application topically as needed. (Patient not taking: Reported on 09/22/2017)  . loperamide (IMODIUM) 2 MG capsule Take 1 capsule (2 mg total) by mouth 4 (four) times daily as needed for diarrhea or loose stools.  Marland Kitchen LORazepam (ATIVAN) 0.5 MG tablet Take one tablet sublingual 3 times a day x 4 days then every 8 hours as needed for nausea (Patient not taking: Reported on 09/22/2017)  . metoprolol succinate (TOPROL-XL) 100 MG 24 hr tablet Take 100 mg by mouth daily.  . ondansetron (ZOFRAN) 4 MG tablet Take one tablet 3 x a day for 4 days then every 8 hours as needed for nausea  . prochlorperazine (COMPAZINE) 10 MG tablet Take 1 tablet (10 mg total) by mouth every 6 (six) hours as needed for nausea or vomiting. (Patient not taking: Reported on 10/15/2017)   No facility-administered encounter medications on file as of 12/11/2017.     Functional Status:   In your present state of health, do you have any difficulty performing the following activities: 08/26/2017 08/07/2017  Hearing? N N  Vision? N N  Difficulty concentrating or making decisions? N Y  Walking or climbing stairs? N Y  Dressing or bathing? N N  Doing errands, shopping? - Y  Conservation officer, nature and eating ? - N  Using the Toilet? - N  In the past six months, have you accidently leaked urine? - N  Do you have problems with loss of bowel control? - N  Managing your Medications? - N  Managing your Finances? - N  Housekeeping or managing your Housekeeping? - N  Some recent data might be hidden    Fall/Depression Screening:    Fall Risk  09/22/2017  08/07/2017 07/29/2017  Falls in the past year? No Yes Yes  Number falls in past yr: - 1 1  Injury with Fall? - No -  Risk for fall due to : - History of fall(s) -  Follow up - Falls prevention discussed -   PHQ 2/9 Scores 09/22/2017 08/07/2017 07/29/2017 07/28/2017 07/22/2017 06/24/2017 01/28/2017  PHQ - 2 Score 0 0 0 0 0 0 0  PHQ- 9 Score - - - - - - -  BP (!) 158/72 (BP Location: Left Arm, Patient Position: Sitting, Cuff Size: Normal) Comment: right arm 162/78  Pulse 65   Resp 20   Wt 115 lb (52.2 kg)   SpO2 99%   BMI 21.03 kg/m    Assessment:   Ongoing case management related to HF Medication adherence related to absence of one medication for several months and no Lasix taken this morning. Plan:  Will reiterate on the importance of taking his ongoing daily weights and fluid retention. Will review once again the HF zones and verify pt's understanding and what to do if acute symptoms should occur. Will verify pt is in the GREEN zone other then the ongoing swelling to his LE. Will strongly encouraged pt to elevate his LE to reduce ongoing swelling and lubrication the skin to avoid risk of skin irritation.  Will discuss pt's medication and call pt's local pharmacy to refill the needed medications on pt's Lasix and Metoprolol. Will also alert both Dr. Mitchel Honour and Jeri Lager, [place] offices of pt's vitals today with elevated BP 162/78 right arm and 158/72 left arm as pt remains asymptomatic with these readings. Also reported pt's BPs and requested lab appointment to be clarified via CAD MD. Office closed and pt's cardiologist will contact pt on Monday with reopen. Will stress the importance of taking his medication and the risk involved if pt is non-adherent with his medication administration.  Plan of care discussed along with goal and interventions adjusted accordingly to pt's progress. Will continue to strongly encourage pt's adherence with managing his ongoing care related to his HF and HTN. No  other inquires or request at this time as RN will scheduled the next home visit in May. RN also provided contact number and informed pt to leave name and RN would call back with an Spanish interpreter to inquire further on his needs however if acute or emergent to seek medical attention as needed (pt with understanding).  Raina Mina, RN Care Management Coordinator [place] Office [phone]

## 2017-12-11 NOTE — Telephone Encounter (Signed)
FYI

## 2017-12-11 NOTE — Telephone Encounter (Signed)
Copied from [place] [id number]. Topic: Quick Communication - See Telephone Encounter >> Dec 11, 2017  1:48 PM Ether Griffins B wrote: CRM for notification. See Telephone encounter for: 12/11/17.  Lattie Haw RN with [facility] calling in to report pt hasnt taken his BP medication in several months. He does have some bilateral edema in both ankles about a 1+. She has requested cardiologist refill bp meds metoprolol succinate (TOPROL-XL) 100 MG 24 hr tablet but they are closed today. He has taken his lasix today, he has skipped a dose of that but did take it today while nurse was there. She is also reporting his BP reading 158/72 on left side and 162/78 on right side. He will need an interpreter on the line when being called back.

## 2017-12-14 NOTE — Telephone Encounter (Signed)
Thanks

## 2017-12-15 ENCOUNTER — Ambulatory Visit (INDEPENDENT_AMBULATORY_CARE_PROVIDER_SITE_OTHER): Payer: Medicare Other | Admitting: Emergency Medicine

## 2017-12-15 ENCOUNTER — Encounter: Payer: Self-pay | Admitting: Emergency Medicine

## 2017-12-15 ENCOUNTER — Other Ambulatory Visit: Payer: Self-pay

## 2017-12-15 VITALS — BP 168/80 | HR 86 | Temp 97.8°F | Resp 16 | Ht 63.0 in | Wt 118.2 lb

## 2017-12-15 DIAGNOSIS — I509 Heart failure, unspecified: Secondary | ICD-10-CM

## 2017-12-15 DIAGNOSIS — I10 Essential (primary) hypertension: Secondary | ICD-10-CM | POA: Diagnosis not present

## 2017-12-15 DIAGNOSIS — C9190 Lymphoid leukemia, unspecified not having achieved remission: Secondary | ICD-10-CM

## 2017-12-15 DIAGNOSIS — N184 Chronic kidney disease, stage 4 (severe): Secondary | ICD-10-CM

## 2017-12-15 DIAGNOSIS — C9110 Chronic lymphocytic leukemia of B-cell type not having achieved remission: Secondary | ICD-10-CM

## 2017-12-15 DIAGNOSIS — D649 Anemia, unspecified: Secondary | ICD-10-CM | POA: Diagnosis not present

## 2017-12-15 NOTE — Progress Notes (Signed)
Nathan Richards 72 y.o.   Chief Complaint  Patient presents with  . medication review    does not understand some othe medications    HISTORY OF PRESENT ILLNESS: This is a 72 y.o. male here for follow-up of several chronic medical problems and medication questions.  States he is doing well.  Asymptomatic.  Has a history of hypertension and CHF.  Sees a cardiologist.  Has a history of CLL.  Sees a hematologist and has been getting chemotherapy for this.  Has a history of chronic renal failure and chronic anemia secondary to it.  Stable.  HPI   Prior to Admission medications   Medication Sig Start Date End Date Taking? Authorizing Provider  amLODipine (NORVASC) 10 MG tablet Take 10 mg by mouth daily. 11/06/17  Yes [provider]  furosemide (LASIX) 80 MG tablet Take 80 mg by mouth daily.   Yes [provider]  isosorbide-hydrALAZINE (BIDIL) 20-37.5 MG tablet Take 1 tablet by mouth 3 (three) times daily. 07/24/17  Yes Alphonzo Grieve, MD  lidocaine-prilocaine (EMLA) cream Apply 1 application topically as needed. 08/27/17  Yes Ennever, Rudell Cobb, MD  loperamide (IMODIUM) 2 MG capsule Take 1 capsule (2 mg total) by mouth 4 (four) times daily as needed for diarrhea or loose stools. 11/19/17  Yes Blanchie Dessert, MD  LORazepam (ATIVAN) 0.5 MG tablet Take one tablet sublingual 3 times a day x 4 days then every 8 hours as needed for nausea 09/18/17  Yes Ennever, Rudell Cobb, MD  metoprolol succinate (TOPROL-XL) 100 MG 24 hr tablet Take 100 mg by mouth daily. 10/07/17  Yes [provider]  ondansetron (ZOFRAN) 4 MG tablet Take one tablet 3 x a day for 4 days then every 8 hours as needed for nausea 09/17/17  Yes Ennever, Rudell Cobb, MD  amLODipine (NORVASC) 5 MG tablet Take 5 mg by mouth 2 (two) times daily.    [provider]  prochlorperazine (COMPAZINE) 10 MG tablet Take 1 tablet (10 mg total) by mouth every 6 (six) hours as needed for nausea or vomiting. Patient not taking:  Reported on 10/15/2017 08/27/17   Volanda Napoleon, MD    Allergies  Allergen Reactions  . Ace Inhibitors     REACTION: cough  . Amlodipine Besylate     REACTION: Impotence  . Beta Adrenergic Blockers     REACTION: Impotence  . Hydrochlorothiazide Other (See Comments)    May have contributed to his hypercalcemia    Patient Active Problem List   Diagnosis Date Noted  . General weakness 09/22/2017  . Generalized abdominal pain 09/22/2017  . Hospital discharge follow-up 07/29/2017  . Chronic congestive heart failure ([place]) 07/29/2017  . Acute combined systolic (congestive) and diastolic (congestive) heart failure ([place]) 07/23/2017  . 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 Itching 06/24/2017  . CLL (chronic lymphocytic leukemia) ([place]) 01/28/2017  . Chronic renal failure, stage 3 (moderate) ([place]) 01/28/2017  . Nocturia 03/21/2014  . Bilateral leg pain 02/22/2014  . Eustachian tube dysfunction 10/21/2013  . Cough 10/04/2013  . Vision changes 09/27/2013  . Noncompliance with medications 09/27/2013  . Acquired blepharoptosis 04/19/2013  . Gastritis and gastroduodenitis 03/14/2013  . CKD (chronic kidney disease) stage 4, GFR 15-29 ml/min (HCC) 02/11/2010  . Other ganglion and cyst of synovium, tendon, and bursa(727.49) 02/08/2010  . 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 CLL 07/26/2007  . ERECTILE DYSFUNCTION, SECONDARY TO MEDICATION 11/16/2006  . HYPERLIPIDEMIA 10/22/2006  .  SCHIZOPHRENIA 10/22/2006  . DEPRESSION, MAJOR, RECURRENT 10/22/2006  . HEARING LOSS NOS OR DEAFNESS 10/22/2006  . Essential hypertension 10/22/2006  . ASCVD 10/22/2006  . OSTEOARTHRITIS, MULTI SITES 10/22/2006    Past Medical History:  Diagnosis Date  . CHF (congestive heart failure) ([place])   . Chronic kidney disease   . CLL (chronic lymphocytic leukemia) ([place])   . Dyspnea   . History of kidney stones   . Hypertension   . Urolithiasis     cystoscopy in 2002    Past Surgical History:  Procedure Laterality Date  . CORONARY ANGIOPLASTY WITH STENT PLACEMENT  02/05/2004   drug eluting to LAD plus baloon 1st diagonal  . INGUINAL HERNIA REPAIR     bilateral  . IR FLUORO GUIDE PORT INSERTION RIGHT  08/26/2017  . IR US GUIDE VASC ACCESS RIGHT  08/26/2017  . KNEE ARTHROSCOPY     right, done in [place]  . UMBILICAL HERNIA REPAIR      Social History   Socioeconomic History  . Marital status: Married    Spouse name: Not on file  . Number of children: Not on file  . Years of education: Not on file  . Highest education level: Not on file  Occupational History    Employer: UNEMPLOYED  Social Needs  . Financial resource strain: Not on file  . Food insecurity:    Worry: Not on file    Inability: Not on file  . Transportation needs:    Medical: Not on file    Non-medical: Not on file  Tobacco Use  . Smoking status: Former Smoker    Years: 31.00    Types: Cigarettes    Last attempt to quit: 12/22/1997    Years since quitting: 19.9  . Smokeless tobacco: Never Used  Substance and Sexual Activity  . Alcohol use: No  . Drug use: No    Comment: remote use of MJ and cocaine  . Sexual activity: Never  Lifestyle  . Physical activity:    Days per week: Not on file    Minutes per session: Not on file  . Stress: Not on file  Relationships  . Social connections:    Talks on phone: Not on file    Gets together: Not on file    Attends religious service: Not on file    Active member of club or organization: Not on file    Attends meetings of clubs or organizations: Not on file    Relationship status: Not on file  . Intimate partner violence:    Fear of current or ex partner: Not on file    Emotionally abused: Not on file    Physically abused: Not on file    Forced sexual activity: Not on file  Other Topics Concern  . Not on file  Social History Narrative   Born [place], lived in [place]. For years before moving to [place]    Married, currently going through a divorce. Wife is in [place] alone    He has a son living here from a prior relationship   He and his current wife have a daughter and son together   Another son was killed at age 40   2 grand daughters in [place], [place], Jaksen Fiorella lives locally   Disabled by psychiatric disease.    [employer]    Family History  Problem Relation Age of [place]  . Diabetes Mother   . Coronary artery disease  Mother   . Coronary artery disease Father        died  . Diabetes Father   . Diabetes Brother   . Diabetes Brother        died     Review of Systems  Constitutional: Positive for weight loss. Negative for fever.  HENT: Negative.  Negative for sore throat.   Eyes: Negative.  Negative for discharge and redness.  Respiratory: Negative.  Negative for cough and shortness of breath.   Cardiovascular: Positive for leg swelling. Negative for chest pain and palpitations.  Gastrointestinal: Negative for abdominal pain, nausea and vomiting.  Genitourinary: Negative.  Negative for dysuria and hematuria.  Skin: Negative for rash.  Neurological: Negative for dizziness and headaches.  Endo/Heme/Allergies: Negative.   All other systems reviewed and are negative.   Vitals:   12/15/17 1139  BP: (!) 168/80  Pulse: 86  Resp: 16  Temp: 97.8 F (36.6 C)  SpO2: 100%    Physical Exam  Constitutional: He is oriented to person, place, and time. He appears well-developed and well-nourished.  HENT:  Head: Normocephalic and atraumatic.  Eyes: Pupils are equal, round, and reactive to light. EOM are normal.  Neck: Normal range of motion. Neck supple.  Cardiovascular: Normal rate and regular rhythm.  Pulmonary/Chest: Effort normal and breath sounds normal.  Musculoskeletal: He exhibits edema (+2 pitting edema).  Neurological: He is alert and oriented to person, place, and time. No sensory deficit. He exhibits normal muscle tone.  Skin:  Skin is warm and dry. Capillary refill takes less than 2 seconds. No rash noted.  Psychiatric: He has a normal mood and affect. His behavior is normal.  Vitals reviewed.  A total of 25 minutes was spent in the room with the patient, greater than 50% of which was in counseling/coordination of care regarding proper use of medications and doses.  List of medications reviewed with patient with particular attention paid to timing and dosage.   ASSESSMENT & PLAN:  Anden was seen today for medication review.  Diagnoses and all orders for this visit:  Essential hypertension  Chronic congestive heart failure, unspecified heart failure type (HCC)  CLL (chronic lymphocytic leukemia) (HCC)  Chronic anemia  CKD (chronic kidney disease) stage 4, GFR 15-29 ml/min [facility])    Patient Instructions       IF you received an x-ray today, you will receive an invoice from [facility]. Please contact [facility] at [phone] with questions or concerns regarding your invoice.   IF you received labwork today, you will receive an invoice from [place]. Please contact [facility] at [phone] with questions or concerns regarding your invoice.   Our billing staff will not be able to assist you with questions regarding bills from these companies.  You will be contacted with the lab results as soon as they are available. The fastest way to get your results is to activate your My Chart account. Instructions are located on the last page of this paperwork. If you have not heard from [place] regarding the results in 2 weeks, please contact this office.     Hipertensin Hypertension El trmino hipertensin es otra forma de denominar a la presin arterial elevada. La presin arterial elevada fuerza al corazn a trabajar ms para bombear la sangre. Esto puede causar problemas con el paso del [place]. Una lectura de presin arterial est compuesta por 2 nmeros. Hay un nmero superior (sistlico)  sobre un nmero inferior (diastlico). Lo ideal es tener la presin arterial por debajo  de 120/80. Las decisiones saludables pueden ayudarle a disminuir su presin arterial. Es posible que necesite medicamentos que le ayuden a disminuir su presin arterial si:  Su presin arterial no disminuye mediante decisiones saludables.  Su presin arterial est por encima de 130/80.  Siga estas instrucciones en su casa: Comida y bebida  Si se lo indican, siga el plan de alimentacin de DASH (Dietary Approaches to Stop Hypertension, Maneras de alimentarse para detener la hipertensin). Esta dieta incluye: ? Que la mitad del plato de cada comida sea de frutas y verduras. ? Que un cuarto del plato de cada comida sea de cereales integrales. Los cereales integrales incluyen pasta integral, arroz integral y pan integral. ? Comer y beber productos lcteos con bajo contenido de [place], como leche descremada o yogur bajo en grasas. ? Que un cuarto del plato de cada comida sea de protenas bajas en grasa (magras). Las protenas bajas en grasa incluyen pescado, pollo sin piel, huevos, frijoles y tofu. ? Evitar consumir carne grasa, carne curada y procesada, o pollo con piel. ? Evitar consumir alimentos prehechos o procesados.  Consuma menos de 1500 mg de sal (sodio) por da.  Limite el consumo de alcohol a no ms de 1 medida por da si es mujer y no est Music therapist y a 2 medidas por da si es hombre. Una medida equivale a 12onzas de cerveza, 5onzas de vino o 1onzas de bebidas alcohlicas de alta graduacin. Estilo de vida  Trabaje con su mdico para mantenerse en un peso saludable o para perder peso. Pregntele a su mdico cul es el peso recomendable para usted.  Realice al menos 30 minutos de ejercicio que haga que se acelere su corazn (ejercicio Arboriculturist) la [employer] de la [place]. Estos pueden incluir caminar, nadar o andar en bicicleta.  Realice al menos 30 minutos de ejercicio que fortalezca  sus msculos (ejercicios de resistencia) al menos 3 das a la [place]. Estos pueden incluir levantar pesas o hacer pilates.  No consuma ningn producto que contenga nicotina o tabaco. Esto incluye cigarrillos y cigarrillos electrnicos. Si necesita ayuda para dejar de fumar, consulte al [employer].  Controle su presin arterial en su casa tal como le indic el mdico.  Concurra a todas las visitas de control como se lo haya indicado el mdico. Esto es importante. Medicamentos  [employer] de venta libre y los recetados solamente como se lo haya indicado el mdico. Siga cuidadosamente las indicaciones.  No omita las dosis de medicamentos para la presin arterial. Los medicamentos pierden eficacia si omite dosis. El hecho de omitir las dosis tambin [place] el riesgo de otros problemas.  Pregntele a su mdico a qu efectos secundarios o reacciones a los Careers information officer. Comunquese con un mdico si:  Piensa que tiene [place] reaccin a los medicamentos que est tomando.  Tiene dolores de cabeza frecuentes (recurrentes).  Siente mareos.  Tiene hinchazn en los tobillos.  Tiene problemas de visin. Solicite ayuda de inmediato si:  Siente un dolor de cabeza muy intenso.  Comienza a sentirse confundido.  Se siente dbil o adormecido.  Siente que va a desmayarse.  Siente un dolor muy intenso en: ? El pecho. ? El vientre (abdomen).  Devuelve (vomita) ms de una vez.  Tiene dificultad para respirar. Resumen  El trmino hipertensin es otra forma de denominar a la presin arterial elevada.  Las decisiones saludables pueden ayudarle a disminuir su presin arterial. Si no puede controlar su presin arterial mediante decisiones saludables,  es posible que deba tomar medicamentos. Esta informacin no tiene Marine scientist el consejo del mdico. Asegrese de hacerle al mdico cualquier pregunta que tenga. Document Released: 01/29/2010 Document Revised:  07/23/2016 Document Reviewed: 07/23/2016 Elsevier Interactive Patient Education  2018 Elsevier Inc.     Agustina Caroli, MD [facility]

## 2017-12-15 NOTE — Patient Instructions (Addendum)
IF you received an x-ray today, you will receive an invoice from [facility]. Please contact [facility] at [phone] with questions or concerns regarding your invoice.   IF you received labwork today, you will receive an invoice from [place]. Please contact [facility] at [phone] with questions or concerns regarding your invoice.   Our billing staff will not be able to assist you with questions regarding bills from these companies.  You will be contacted with the lab results as soon as they are available. The fastest way to get your results is to activate your My Chart account. Instructions are located on the last page of this paperwork. If you have not heard from [place] regarding the results in 2 weeks, please contact this office.     Hipertensin Hypertension El trmino hipertensin es otra forma de denominar a la presin arterial elevada. La presin arterial elevada fuerza al corazn a trabajar ms para bombear la sangre. Esto puede causar problemas con el paso del [place]. Una lectura de presin arterial est compuesta por 2 nmeros. Hay un nmero superior (sistlico) sobre un nmero inferior (diastlico). Lo ideal es tener la presin arterial por debajo de 120/80. Las decisiones saludables pueden ayudarle a disminuir su presin arterial. Es posible que necesite medicamentos que le ayuden a disminuir su presin arterial si:  Su presin arterial no disminuye mediante decisiones saludables.  Su presin arterial est por encima de 130/80.  Siga estas instrucciones en su casa: Comida y bebida  Si se lo indican, siga el plan de alimentacin de DASH (Dietary Approaches to Stop Hypertension, Maneras de alimentarse para detener la hipertensin). Esta dieta incluye: ? Que la mitad del plato de cada comida sea de frutas y verduras. ? Que un cuarto del plato de cada comida sea de cereales integrales. Los cereales integrales incluyen pasta integral, arroz integral y pan  integral. ? Comer y beber productos lcteos con bajo contenido de [place], como leche descremada o yogur bajo en grasas. ? Que un cuarto del plato de cada comida sea de protenas bajas en grasa (magras). Las protenas bajas en grasa incluyen pescado, pollo sin piel, huevos, frijoles y tofu. ? Evitar consumir carne grasa, carne curada y procesada, o pollo con piel. ? Evitar consumir alimentos prehechos o procesados.  Consuma menos de 1500 mg de sal (sodio) por da.  Limite el consumo de alcohol a no ms de 1 medida por da si es mujer y no est Music therapist y a 2 medidas por da si es hombre. Una medida equivale a 12onzas de cerveza, 5onzas de vino o 1onzas de bebidas alcohlicas de alta graduacin. Estilo de vida  Trabaje con su mdico para mantenerse en un peso saludable o para perder peso. Pregntele a su mdico cul es el peso recomendable para usted.  Realice al menos 30 minutos de ejercicio que haga que se acelere su corazn (ejercicio Arboriculturist) la [employer] de la [place]. Estos pueden incluir caminar, nadar o andar en bicicleta.  Realice al menos 30 minutos de ejercicio que fortalezca sus msculos (ejercicios de resistencia) al menos 3 das a la [place]. Estos pueden incluir levantar pesas o hacer pilates.  No consuma ningn producto que contenga nicotina o tabaco. Esto incluye cigarrillos y cigarrillos electrnicos. Si necesita ayuda para dejar de fumar, consulte al [employer].  Controle su presin arterial en su casa tal como le indic el mdico.  Concurra a todas las visitas de control como se lo haya indicado el mdico. Esto es  importante. Medicamentos  [employer] de venta libre y los recetados solamente como se lo haya indicado el mdico. Siga cuidadosamente las indicaciones.  No omita las dosis de medicamentos para la presin arterial. Los medicamentos pierden eficacia si omite dosis. El hecho de omitir las dosis tambin [place] el riesgo de otros  problemas.  Pregntele a su mdico a qu efectos secundarios o reacciones a los Careers information officer. Comunquese con un mdico si:  Piensa que tiene [place] reaccin a los medicamentos que est tomando.  Tiene dolores de cabeza frecuentes (recurrentes).  Siente mareos.  Tiene hinchazn en los tobillos.  Tiene problemas de visin. Solicite ayuda de inmediato si:  Siente un dolor de cabeza muy intenso.  Comienza a sentirse confundido.  Se siente dbil o adormecido.  Siente que va a desmayarse.  Siente un dolor muy intenso en: ? El pecho. ? El vientre (abdomen).  Devuelve (vomita) ms de una vez.  Tiene dificultad para respirar. Resumen  El trmino hipertensin es otra forma de denominar a la presin arterial elevada.  Las decisiones saludables pueden ayudarle a disminuir su presin arterial. Si no puede controlar su presin arterial mediante decisiones saludables, es posible que deba tomar medicamentos. Esta informacin no tiene Marine scientist el consejo del mdico. Asegrese de hacerle al mdico cualquier pregunta que tenga. Document Released: 01/29/2010 Document Revised: 07/23/2016 Document Reviewed: 07/23/2016 Elsevier Interactive Patient Education  [employer].

## 2017-12-16 LAB — PROTEIN ELECTROPHORESIS, SERUM, WITH REFLEX
A/G Ratio: 1.3 (ref 0.7–1.7)
ALPHA-1-GLOBULIN: 0.3 g/dL (ref 0.0–0.4)
ALPHA-2-GLOBULIN: 0.7 g/dL (ref 0.4–1.0)
Albumin ELP: 3.5 g/dL (ref 2.9–4.4)
Beta Globulin: 0.6 g/dL — ABNORMAL LOW (ref 0.7–1.3)
Gamma Globulin: 1.1 g/dL (ref 0.4–1.8)
Globulin, Total: 2.7 g/dL (ref 2.2–3.9)
M-SPIKE, %: 0.3 g/dL — AB
SPEP Interpretation: 0
TOTAL PROTEIN ELP: 6.2 g/dL (ref 6.0–8.5)

## 2017-12-16 LAB — IMMUNOFIXATION REFLEX, SERUM
IGA: 155 mg/dL (ref 61–437)
IGG (IMMUNOGLOBIN G), SERUM: 1237 mg/dL (ref 700–1600)
IgM (Immunoglobulin M), Srm: 131 mg/dL (ref 20–172)

## 2017-12-23 ENCOUNTER — Inpatient Hospital Stay (HOSPITAL_COMMUNITY)
Admission: EM | Admit: 2017-12-23 | Discharge: 2017-12-26 | DRG: 291 | Disposition: A | Discharge status: Hospice | Payer: Medicare Other | Attending: Internal Medicine | Admitting: Internal Medicine

## 2017-12-23 ENCOUNTER — Other Ambulatory Visit: Payer: Self-pay

## 2017-12-23 DIAGNOSIS — Z955 Presence of coronary angioplasty implant and graft: Secondary | ICD-10-CM

## 2017-12-23 DIAGNOSIS — I5043 Acute on chronic combined systolic (congestive) and diastolic (congestive) heart failure: Secondary | ICD-10-CM | POA: Diagnosis present

## 2017-12-23 DIAGNOSIS — Z6821 Body mass index (BMI) 21.0-21.9, adult: Secondary | ICD-10-CM

## 2017-12-23 DIAGNOSIS — Z888 Allergy status to other drugs, medicaments and biological substances status: Secondary | ICD-10-CM

## 2017-12-23 DIAGNOSIS — C9110 Chronic lymphocytic leukemia of B-cell type not having achieved remission: Secondary | ICD-10-CM

## 2017-12-23 DIAGNOSIS — R627 Adult failure to thrive: Secondary | ICD-10-CM | POA: Diagnosis present

## 2017-12-23 DIAGNOSIS — J189 Pneumonia, unspecified organism: Secondary | ICD-10-CM

## 2017-12-23 DIAGNOSIS — Z515 Encounter for palliative care: Secondary | ICD-10-CM | POA: Diagnosis present

## 2017-12-23 DIAGNOSIS — N183 Chronic kidney disease, stage 3 unspecified: Secondary | ICD-10-CM

## 2017-12-23 DIAGNOSIS — R7989 Other specified abnormal findings of blood chemistry: Secondary | ICD-10-CM

## 2017-12-23 DIAGNOSIS — Y95 Nosocomial condition: Secondary | ICD-10-CM | POA: Diagnosis present

## 2017-12-23 DIAGNOSIS — K5900 Constipation, unspecified: Secondary | ICD-10-CM | POA: Diagnosis present

## 2017-12-23 DIAGNOSIS — I130 Hypertensive heart and chronic kidney disease with heart failure and stage 1 through stage 4 chronic kidney disease, or unspecified chronic kidney disease: Secondary | ICD-10-CM | POA: Diagnosis not present

## 2017-12-23 DIAGNOSIS — Z833 Family history of diabetes mellitus: Secondary | ICD-10-CM

## 2017-12-23 DIAGNOSIS — N184 Chronic kidney disease, stage 4 (severe): Secondary | ICD-10-CM

## 2017-12-23 DIAGNOSIS — R1084 Generalized abdominal pain: Secondary | ICD-10-CM | POA: Diagnosis present

## 2017-12-23 DIAGNOSIS — E872 Acidosis: Secondary | ICD-10-CM | POA: Diagnosis present

## 2017-12-23 DIAGNOSIS — I509 Heart failure, unspecified: Secondary | ICD-10-CM

## 2017-12-23 DIAGNOSIS — R0602 Shortness of breath: Secondary | ICD-10-CM | POA: Diagnosis not present

## 2017-12-23 DIAGNOSIS — D649 Anemia, unspecified: Secondary | ICD-10-CM

## 2017-12-23 DIAGNOSIS — D631 Anemia in chronic kidney disease: Secondary | ICD-10-CM | POA: Diagnosis present

## 2017-12-23 DIAGNOSIS — Z8249 Family history of ischemic heart disease and other diseases of the circulatory system: Secondary | ICD-10-CM

## 2017-12-23 DIAGNOSIS — I5042 Chronic combined systolic (congestive) and diastolic (congestive) heart failure: Secondary | ICD-10-CM

## 2017-12-23 DIAGNOSIS — E440 Moderate protein-calorie malnutrition: Secondary | ICD-10-CM | POA: Diagnosis present

## 2017-12-23 DIAGNOSIS — Z79899 Other long term (current) drug therapy: Secondary | ICD-10-CM

## 2017-12-23 DIAGNOSIS — D61818 Other pancytopenia: Secondary | ICD-10-CM | POA: Diagnosis present

## 2017-12-23 DIAGNOSIS — Z87891 Personal history of nicotine dependence: Secondary | ICD-10-CM

## 2017-12-23 DIAGNOSIS — D61810 Antineoplastic chemotherapy induced pancytopenia: Secondary | ICD-10-CM | POA: Diagnosis present

## 2017-12-23 DIAGNOSIS — T451X5A Adverse effect of antineoplastic and immunosuppressive drugs, initial encounter: Secondary | ICD-10-CM | POA: Diagnosis present

## 2017-12-23 DIAGNOSIS — Z66 Do not resuscitate: Secondary | ICD-10-CM | POA: Diagnosis not present

## 2017-12-23 DIAGNOSIS — R778 Other specified abnormalities of plasma proteins: Secondary | ICD-10-CM

## 2017-12-23 DIAGNOSIS — J9601 Acute respiratory failure with hypoxia: Secondary | ICD-10-CM | POA: Diagnosis present

## 2017-12-23 DIAGNOSIS — Z87442 Personal history of urinary calculi: Secondary | ICD-10-CM

## 2017-12-23 HISTORY — DX: Anemia, unspecified: D64.9

## 2017-12-23 LAB — URINALYSIS, ROUTINE W REFLEX MICROSCOPIC
BACTERIA UA: NONE SEEN
BILIRUBIN URINE: NEGATIVE
Glucose, UA: 50 mg/dL — AB
Hgb urine dipstick: NEGATIVE
Ketones, ur: NEGATIVE mg/dL
LEUKOCYTES UA: NEGATIVE
NITRITE: NEGATIVE
PH: 6 (ref 5.0–8.0)
Protein, ur: >=300 mg/dL — AB
SPECIFIC GRAVITY, URINE: 1.014 (ref 1.005–1.030)

## 2017-12-23 LAB — COMPREHENSIVE METABOLIC PANEL
ALBUMIN: 2.9 g/dL — AB (ref 3.5–5.0)
ALK PHOS: 76 U/L (ref 38–126)
ALT: 24 U/L (ref 17–63)
AST: 27 U/L (ref 15–41)
Anion gap: 11 (ref 5–15)
BILIRUBIN TOTAL: 0.5 mg/dL (ref 0.3–1.2)
BUN: 48 mg/dL — AB (ref 6–20)
CO2: 18 mmol/L — ABNORMAL LOW (ref 22–32)
Calcium: 8.1 mg/dL — ABNORMAL LOW (ref 8.9–10.3)
Chloride: 106 mmol/L (ref 101–111)
Creatinine, Ser: 4.79 mg/dL — ABNORMAL HIGH (ref 0.61–1.24)
GFR calc Af Amer: 13 mL/min — ABNORMAL LOW (ref >60)
GFR, EST NON AFRICAN AMERICAN: 11 mL/min — AB (ref >60)
GLUCOSE: 111 mg/dL — AB (ref 65–99)
Potassium: 3.7 mmol/L (ref 3.5–5.1)
Sodium: 135 mmol/L (ref 135–145)
TOTAL PROTEIN: 6.1 g/dL — AB (ref 6.5–8.1)

## 2017-12-23 LAB — CBC
HEMATOCRIT: 16 % — AB (ref 39.0–52.0)
Hemoglobin: 5.5 g/dL — CL (ref 13.0–17.0)
MCH: 28.8 pg (ref 26.0–34.0)
MCHC: 34.4 g/dL (ref 30.0–36.0)
MCV: 83.8 fL (ref 78.0–100.0)
PLATELETS: 141 10*3/uL — AB (ref 150–400)
RBC: 1.91 MIL/uL — ABNORMAL LOW (ref 4.22–5.81)
RDW: 17 % — ABNORMAL HIGH (ref 11.5–15.5)
WBC: 2.4 10*3/uL — AB (ref 4.0–10.5)

## 2017-12-23 LAB — LIPASE, BLOOD: Lipase: 39 U/L (ref 11–51)

## 2017-12-23 LAB — I-STAT TROPONIN, ED: Troponin i, poc: 0.14 ng/mL (ref 0.00–0.08)

## 2017-12-23 NOTE — ED Notes (Signed)
Rec'd Information about lab result

## 2017-12-23 NOTE — ED Notes (Signed)
Labs 5.4 hemoglobin

## 2017-12-23 NOTE — ED Triage Notes (Signed)
Patient is currently receiving chemotherapy (last dose received 11/30/17 and states that he has had abdominal pain since then and the pain radiates into his chest. States that he has also had diarrhea since chemo began (January).

## 2017-12-23 NOTE — ED Notes (Signed)
Pt. Called for vitals no answer

## 2017-12-24 ENCOUNTER — Emergency Department (HOSPITAL_COMMUNITY): Payer: Medicare Other

## 2017-12-24 ENCOUNTER — Other Ambulatory Visit: Payer: Self-pay

## 2017-12-24 ENCOUNTER — Inpatient Hospital Stay (HOSPITAL_COMMUNITY): Payer: Medicare Other

## 2017-12-24 ENCOUNTER — Encounter (HOSPITAL_COMMUNITY): Payer: Self-pay | Admitting: Internal Medicine

## 2017-12-24 DIAGNOSIS — D649 Anemia, unspecified: Secondary | ICD-10-CM | POA: Diagnosis not present

## 2017-12-24 DIAGNOSIS — I130 Hypertensive heart and chronic kidney disease with heart failure and stage 1 through stage 4 chronic kidney disease, or unspecified chronic kidney disease: Secondary | ICD-10-CM | POA: Diagnosis present

## 2017-12-24 DIAGNOSIS — J189 Pneumonia, unspecified organism: Secondary | ICD-10-CM | POA: Diagnosis not present

## 2017-12-24 DIAGNOSIS — Z87442 Personal history of urinary calculi: Secondary | ICD-10-CM | POA: Diagnosis not present

## 2017-12-24 DIAGNOSIS — D61818 Other pancytopenia: Secondary | ICD-10-CM | POA: Diagnosis present

## 2017-12-24 DIAGNOSIS — Z66 Do not resuscitate: Secondary | ICD-10-CM | POA: Diagnosis not present

## 2017-12-24 DIAGNOSIS — R627 Adult failure to thrive: Secondary | ICD-10-CM | POA: Diagnosis present

## 2017-12-24 DIAGNOSIS — Z955 Presence of coronary angioplasty implant and graft: Secondary | ICD-10-CM | POA: Diagnosis not present

## 2017-12-24 DIAGNOSIS — Z833 Family history of diabetes mellitus: Secondary | ICD-10-CM | POA: Diagnosis not present

## 2017-12-24 DIAGNOSIS — I351 Nonrheumatic aortic (valve) insufficiency: Secondary | ICD-10-CM | POA: Diagnosis not present

## 2017-12-24 DIAGNOSIS — Z515 Encounter for palliative care: Secondary | ICD-10-CM | POA: Diagnosis not present

## 2017-12-24 DIAGNOSIS — C9110 Chronic lymphocytic leukemia of B-cell type not having achieved remission: Secondary | ICD-10-CM | POA: Diagnosis present

## 2017-12-24 DIAGNOSIS — J9601 Acute respiratory failure with hypoxia: Secondary | ICD-10-CM | POA: Diagnosis not present

## 2017-12-24 DIAGNOSIS — N189 Chronic kidney disease, unspecified: Secondary | ICD-10-CM | POA: Diagnosis not present

## 2017-12-24 DIAGNOSIS — K5900 Constipation, unspecified: Secondary | ICD-10-CM | POA: Diagnosis present

## 2017-12-24 DIAGNOSIS — R609 Edema, unspecified: Secondary | ICD-10-CM | POA: Diagnosis not present

## 2017-12-24 DIAGNOSIS — Z6821 Body mass index (BMI) 21.0-21.9, adult: Secondary | ICD-10-CM | POA: Diagnosis not present

## 2017-12-24 DIAGNOSIS — Z888 Allergy status to other drugs, medicaments and biological substances status: Secondary | ICD-10-CM | POA: Diagnosis not present

## 2017-12-24 DIAGNOSIS — T451X5A Adverse effect of antineoplastic and immunosuppressive drugs, initial encounter: Secondary | ICD-10-CM | POA: Diagnosis present

## 2017-12-24 DIAGNOSIS — D61810 Antineoplastic chemotherapy induced pancytopenia: Secondary | ICD-10-CM | POA: Diagnosis present

## 2017-12-24 DIAGNOSIS — Z87891 Personal history of nicotine dependence: Secondary | ICD-10-CM | POA: Diagnosis not present

## 2017-12-24 DIAGNOSIS — I5043 Acute on chronic combined systolic (congestive) and diastolic (congestive) heart failure: Secondary | ICD-10-CM | POA: Diagnosis present

## 2017-12-24 DIAGNOSIS — E440 Moderate protein-calorie malnutrition: Secondary | ICD-10-CM | POA: Diagnosis present

## 2017-12-24 DIAGNOSIS — R1084 Generalized abdominal pain: Secondary | ICD-10-CM | POA: Diagnosis not present

## 2017-12-24 DIAGNOSIS — N184 Chronic kidney disease, stage 4 (severe): Secondary | ICD-10-CM | POA: Diagnosis present

## 2017-12-24 DIAGNOSIS — D631 Anemia in chronic kidney disease: Secondary | ICD-10-CM | POA: Diagnosis present

## 2017-12-24 DIAGNOSIS — E872 Acidosis: Secondary | ICD-10-CM | POA: Diagnosis present

## 2017-12-24 DIAGNOSIS — Z79899 Other long term (current) drug therapy: Secondary | ICD-10-CM | POA: Diagnosis not present

## 2017-12-24 DIAGNOSIS — Z8249 Family history of ischemic heart disease and other diseases of the circulatory system: Secondary | ICD-10-CM | POA: Diagnosis not present

## 2017-12-24 DIAGNOSIS — R0602 Shortness of breath: Secondary | ICD-10-CM | POA: Diagnosis present

## 2017-12-24 DIAGNOSIS — Y95 Nosocomial condition: Secondary | ICD-10-CM | POA: Diagnosis present

## 2017-12-24 HISTORY — DX: Anemia, unspecified: D64.9

## 2017-12-24 LAB — CBC WITH DIFFERENTIAL/PLATELET
BASOS ABS: 0 10*3/uL (ref 0.0–0.1)
BASOS PCT: 1 %
EOS ABS: 0.3 10*3/uL (ref 0.0–0.7)
Eosinophils Relative: 14 %
HCT: 25.3 % — ABNORMAL LOW (ref 39.0–52.0)
Hemoglobin: 8.6 g/dL — ABNORMAL LOW (ref 13.0–17.0)
LYMPHS ABS: 1.3 10*3/uL (ref 0.7–4.0)
Lymphocytes Relative: 55 %
MCH: 28.3 pg (ref 26.0–34.0)
MCHC: 34 g/dL (ref 30.0–36.0)
MCV: 83.2 fL (ref 78.0–100.0)
Monocytes Absolute: 0.1 10*3/uL (ref 0.1–1.0)
Monocytes Relative: 4 %
NEUTROS ABS: 0.6 10*3/uL — AB (ref 1.7–7.7)
Neutrophils Relative %: 26 %
PLATELETS: 124 10*3/uL — AB (ref 150–400)
RBC: 3.04 MIL/uL — ABNORMAL LOW (ref 4.22–5.81)
RDW: 15.5 % (ref 11.5–15.5)
WBC: 2.3 10*3/uL — ABNORMAL LOW (ref 4.0–10.5)

## 2017-12-24 LAB — EXPECTORATED SPUTUM ASSESSMENT W GRAM STAIN, RFLX TO RESP C

## 2017-12-24 LAB — EXPECTORATED SPUTUM ASSESSMENT W REFEX TO RESP CULTURE

## 2017-12-24 LAB — TROPONIN I
Troponin I: 0.15 ng/mL (ref <0.03)
Troponin I: 0.14 ng/mL (ref <0.03)
Troponin I: 0.15 ng/mL (ref <0.03)

## 2017-12-24 LAB — PREPARE RBC (CROSSMATCH)

## 2017-12-24 LAB — POC OCCULT BLOOD, ED: FECAL OCCULT BLD: NEGATIVE

## 2017-12-24 LAB — STREP PNEUMONIAE URINARY ANTIGEN: STREP PNEUMO URINARY ANTIGEN: NEGATIVE

## 2017-12-24 MED ORDER — FENTANYL CITRATE (PF) 100 MCG/2ML IJ SOLN
50.0000 ug | Freq: Once | INTRAMUSCULAR | Status: AC
  Administered 2017-12-24: 50 ug via INTRAVENOUS
  Filled 2017-12-24: qty 2

## 2017-12-24 MED ORDER — FUROSEMIDE 10 MG/ML IJ SOLN
40.0000 mg | Freq: Once | INTRAMUSCULAR | Status: AC
  Administered 2017-12-24: 40 mg via INTRAVENOUS
  Filled 2017-12-24: qty 4

## 2017-12-24 MED ORDER — SODIUM CHLORIDE 0.9 % IV SOLN
1.0000 g | Freq: Every day | INTRAVENOUS | Status: DC
  Administered 2017-12-24 – 2017-12-25 (×2): 1 g via INTRAVENOUS
  Filled 2017-12-24 (×5): qty 1

## 2017-12-24 MED ORDER — ACETAMINOPHEN 650 MG RE SUPP: 650.0000 mg | Freq: Four times a day (QID) | RECTAL | Status: DC | PRN

## 2017-12-24 MED ORDER — HEPARIN SOD (PORK) LOCK FLUSH 100 UNIT/ML IV SOLN
250.0000 [IU] | INTRAVENOUS | Status: DC | PRN
  Filled 2017-12-24: qty 3

## 2017-12-24 MED ORDER — AMLODIPINE BESYLATE 10 MG PO TABS
10.0000 mg | ORAL_TABLET | Freq: Every day | ORAL | Status: DC
  Administered 2017-12-24 – 2017-12-25 (×2): 10 mg via ORAL
  Filled 2017-12-24: qty 2
  Filled 2017-12-24: qty 1

## 2017-12-24 MED ORDER — IPRATROPIUM-ALBUTEROL 0.5-2.5 (3) MG/3ML IN SOLN
3.0000 mL | Freq: Three times a day (TID) | RESPIRATORY_TRACT | Status: DC
  Administered 2017-12-24 (×3): 3 mL via RESPIRATORY_TRACT
  Filled 2017-12-24 (×3): qty 3

## 2017-12-24 MED ORDER — ACETAMINOPHEN 325 MG PO TABS
650.0000 mg | ORAL_TABLET | Freq: Four times a day (QID) | ORAL | Status: DC | PRN
  Administered 2017-12-24 – 2017-12-25 (×3): 650 mg via ORAL
  Filled 2017-12-24 (×3): qty 2

## 2017-12-24 MED ORDER — ISOSORB DINITRATE-HYDRALAZINE 20-37.5 MG PO TABS
1.0000 | ORAL_TABLET | Freq: Three times a day (TID) | ORAL | Status: DC
  Administered 2017-12-24 – 2017-12-26 (×8): 1 via ORAL
  Filled 2017-12-24 (×11): qty 1

## 2017-12-24 MED ORDER — TECHNETIUM TO 99M ALBUMIN AGGREGATED
4.0000 | Freq: Once | INTRAVENOUS | Status: AC | PRN
  Administered 2017-12-24: 4 via INTRAVENOUS

## 2017-12-24 MED ORDER — BENZONATATE 100 MG PO CAPS
100.0000 mg | ORAL_CAPSULE | Freq: Once | ORAL | Status: AC
  Administered 2017-12-24: 100 mg via ORAL
  Filled 2017-12-24: qty 1

## 2017-12-24 MED ORDER — SENNOSIDES-DOCUSATE SODIUM 8.6-50 MG PO TABS
1.0000 | ORAL_TABLET | Freq: Two times a day (BID) | ORAL | Status: DC
  Administered 2017-12-24 – 2017-12-25 (×3): 1 via ORAL
  Filled 2017-12-24 (×3): qty 1

## 2017-12-24 MED ORDER — HYDRALAZINE HCL 20 MG/ML IJ SOLN: 10.0000 mg | INTRAMUSCULAR | Status: DC | PRN

## 2017-12-24 MED ORDER — FUROSEMIDE 20 MG PO TABS: 80.0000 mg | ORAL_TABLET | Freq: Every day | ORAL | Status: DC

## 2017-12-24 MED ORDER — ONDANSETRON HCL 4 MG/2ML IJ SOLN: 4.0000 mg | Freq: Four times a day (QID) | INTRAMUSCULAR | Status: DC | PRN

## 2017-12-24 MED ORDER — ONDANSETRON HCL 4 MG/2ML IJ SOLN
4.0000 mg | Freq: Once | INTRAMUSCULAR | Status: AC
  Administered 2017-12-24: 4 mg via INTRAVENOUS
  Filled 2017-12-24: qty 2

## 2017-12-24 MED ORDER — SODIUM CHLORIDE 0.9% FLUSH: 10.0000 mL | INTRAVENOUS | Status: DC | PRN

## 2017-12-24 MED ORDER — HEPARIN SOD (PORK) LOCK FLUSH 100 UNIT/ML IV SOLN
500.0000 [IU] | Freq: Every day | INTRAVENOUS | Status: DC | PRN
  Filled 2017-12-24: qty 5

## 2017-12-24 MED ORDER — ENOXAPARIN SODIUM 40 MG/0.4ML ~~LOC~~ SOLN: 40.0000 mg | SUBCUTANEOUS | Status: DC

## 2017-12-24 MED ORDER — IOPAMIDOL (ISOVUE-300) INJECTION 61%
INTRAVENOUS | Status: AC
  Administered 2017-12-24: 08:00:00
  Filled 2017-12-24: qty 30

## 2017-12-24 MED ORDER — ONDANSETRON HCL 4 MG PO TABS: 4.0000 mg | ORAL_TABLET | Freq: Three times a day (TID) | ORAL | Status: DC | PRN

## 2017-12-24 MED ORDER — ENSURE ENLIVE PO LIQD
237.0000 mL | Freq: Two times a day (BID) | ORAL | Status: DC
  Administered 2017-12-25 (×2): 237 mL via ORAL

## 2017-12-24 MED ORDER — SODIUM CHLORIDE 0.9 % IV SOLN
10.0000 mL/h | Freq: Once | INTRAVENOUS | Status: AC
Start: 2017-12-24 — End: 2017-12-24
  Administered 2017-12-24: 10 mL/h via INTRAVENOUS

## 2017-12-24 MED ORDER — SODIUM CHLORIDE 0.9% FLUSH: 3.0000 mL | INTRAVENOUS | Status: DC | PRN

## 2017-12-24 MED ORDER — ENOXAPARIN SODIUM 30 MG/0.3ML ~~LOC~~ SOLN
30.0000 mg | SUBCUTANEOUS | Status: DC
  Administered 2017-12-25 – 2017-12-26 (×2): 30 mg via SUBCUTANEOUS
  Filled 2017-12-24 (×2): qty 0.3

## 2017-12-24 MED ORDER — LOPERAMIDE HCL 2 MG PO CAPS: 2.0000 mg | ORAL_CAPSULE | Freq: Four times a day (QID) | ORAL | Status: DC | PRN

## 2017-12-24 MED ORDER — METOPROLOL SUCCINATE ER 100 MG PO TB24
100.0000 mg | ORAL_TABLET | Freq: Every day | ORAL | Status: DC
  Administered 2017-12-24 – 2017-12-26 (×3): 100 mg via ORAL
  Filled 2017-12-24 (×3): qty 1

## 2017-12-24 MED ORDER — LORAZEPAM 0.5 MG PO TABS: 0.5000 mg | ORAL_TABLET | Freq: Three times a day (TID) | ORAL | Status: DC | PRN

## 2017-12-24 MED ORDER — IPRATROPIUM-ALBUTEROL 0.5-2.5 (3) MG/3ML IN SOLN: 3.0000 mL | RESPIRATORY_TRACT | Status: DC | PRN

## 2017-12-24 MED ORDER — FUROSEMIDE 10 MG/ML IJ SOLN
80.0000 mg | Freq: Every day | INTRAMUSCULAR | Status: DC
  Administered 2017-12-24: 80 mg via INTRAVENOUS

## 2017-12-24 MED ORDER — FUROSEMIDE 10 MG/ML IJ SOLN
80.0000 mg | Freq: Two times a day (BID) | INTRAMUSCULAR | Status: DC
  Administered 2017-12-24 – 2017-12-25 (×2): 80 mg via INTRAVENOUS
  Filled 2017-12-24 (×2): qty 8

## 2017-12-24 MED ORDER — ENOXAPARIN SODIUM 40 MG/0.4ML ~~LOC~~ SOLN
40.0000 mg | SUBCUTANEOUS | Status: DC
  Administered 2017-12-24: 40 mg via SUBCUTANEOUS
  Filled 2017-12-24: qty 0.4

## 2017-12-24 MED ORDER — SODIUM CHLORIDE 0.9 % IV SOLN
250.0000 mL | Freq: Once | INTRAVENOUS | Status: AC
  Administered 2017-12-24: 250 mL via INTRAVENOUS

## 2017-12-24 MED ORDER — ONDANSETRON HCL 4 MG PO TABS: 4.0000 mg | ORAL_TABLET | Freq: Four times a day (QID) | ORAL | Status: DC | PRN

## 2017-12-24 MED ORDER — VANCOMYCIN HCL IN DEXTROSE 1-5 GM/200ML-% IV SOLN
1000.0000 mg | Freq: Once | INTRAVENOUS | Status: AC
  Administered 2017-12-24: 1000 mg via INTRAVENOUS
  Filled 2017-12-24: qty 200

## 2017-12-24 MED ORDER — TECHNETIUM TC 99M DIETHYLENETRIAME-PENTAACETIC ACID
30.0000 | Freq: Once | INTRAVENOUS | Status: AC | PRN
  Administered 2017-12-24: 30 via RESPIRATORY_TRACT

## 2017-12-24 MED ORDER — LEVOFLOXACIN IN D5W 750 MG/150ML IV SOLN
750.0000 mg | Freq: Once | INTRAVENOUS | Status: AC
  Administered 2017-12-24: 750 mg via INTRAVENOUS
  Filled 2017-12-24: qty 150

## 2017-12-24 NOTE — ED Notes (Signed)
Pt remains in nuclear medicine

## 2017-12-24 NOTE — ED Notes (Signed)
1 unit ready

## 2017-12-24 NOTE — ED Notes (Signed)
Occult card at bedside 

## 2017-12-24 NOTE — ED Notes (Addendum)
Spoke with Dr. Leonides Schanz about pt needing blood cultures and getting blood as the same time. Told Dr. Leonides Schanz that we needed to wait 2 hours after blood administration and the pt just started his second unit so it would be a while until we can get the cultures and administer antibiotics. Dr. Leonides Schanz advised to start the antibiotics because pt had pneumonia and needed these started. Dr. Leonides Schanz advised the cultures could wait until after the administration of blood.

## 2017-12-24 NOTE — Progress Notes (Signed)
Pharmacy Antibiotic Note  Nathan Richards is a 72 y.o. male admitted on 12/23/2017 with pneumonia.  Pharmacy has been consulted for vancomycin dosing. He has CKD and is not on HD  Plan: Vancomycin 1gm IV x 1 F/u random level in 3-5 days for redose  Height: $Remove'5\' 2"'urXLuCx$  (157.5 cm) Weight: 115 lb (52.2 kg) IBW/kg (Calculated) : 54.6  Temp (24hrs), Avg:99 F (37.2 C), Min:98.7 F (37.1 C), Max:99.5 F (37.5 C)  Recent Labs  Lab 12/23/17 2200  WBC 2.4*  CREATININE 4.79*    Estimated Creatinine Clearance: 10.7 mL/min (A) (by C-G formula based on SCr of 4.79 mg/dL (H)).    Allergies  Allergen Reactions  . Ace Inhibitors     REACTION: cough  . Amlodipine Besylate     REACTION: Impotence  . Beta Adrenergic Blockers     REACTION: Impotence  . Hydrochlorothiazide Other (See Comments)    May have contributed to his hypercalcemia     Thank you for allowing pharmacy to be a part of this patient's care.  Excell Seltzer [place] 12/24/2017 5:30 AM

## 2017-12-24 NOTE — Progress Notes (Signed)
PROGRESS NOTE  Nathan Richards GMW:[id number] DOB: 11-28-1945 DOA: 12/23/2017 PCP: Horald Pollen, MD  HPI/Recap of past 24 hours:  Feeling better after prbc transfusion He report ab pain and chest pain has resolved  Assessment/Plan: Principal Problem:   Acute respiratory failure with hypoxia ([place]) Active Problems:   CKD (chronic kidney disease) stage 4, GFR 15-29 ml/min (HCC)   CLL (chronic lymphocytic leukemia) (HCC)   Chronic congestive heart failure (HCC)   Generalized abdominal pain   Pancytopenia (HCC)   HCAP (healthcare-associated pneumonia)  Acute Hypoxic respiratory failure, likely from chf exacerbation, severe anemia, pna with fever -he reports /doe/orthopeniax2weeks - he has Bilateral lower extremity pitting edema , venous doppler pending -v/Q scan low probability for PE -increase lasix to $Remove'80mg'GuEFwZh$  bid, continue abx,  -echo pending  HTN; continue bidil,lopressor, norvasc, lasix  Acute on chronic anemia: -last chemo on 4/18, no overt sign of bleeding -s/p prbc x3untis  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 Patient states last chemo " almost killed me, I am not going to get more chemotherapy" - I have discussed case with Dr Marin Olp who agreed palliative care consult.  FTT: malnutrition Needs goal of care  Code Status: partial code, no intubation, yes to cpr  Family Communication: patient   Disposition Plan: not ready to discharge   Consultants:  case discussed with oncology Dr Marin Olp  Palliative care consult  Procedures:  prbc transfusion x3  Antibiotics:  As above   Objective: BP (!) 149/95   Pulse 87   Temp 99.3 F (37.4 C) (Oral)   Resp (!) 26   Ht $R'5\' 2"'Bo$  (1.575 m)   Wt 52.2 kg (115 lb)   SpO2 95%   BMI 21.03 kg/m   Intake/Output Summary (Last 24 hours) at 12/24/2017 1516 Last data filed at 12/24/2017 1313 Gross per 24 hour  Intake 3116 ml  Output 350 ml  Net 2766 ml    Filed Weights   12/23/17 2155  Weight: 52.2 kg (115 lb)    Exam: Patient is examined daily including today on 12/24/2017, exams remain the same as of yesterday except that has changed    General:  Thin, chronically ill, dyspnea with minimal exertion  Cardiovascular: RRR  Respiratory: diminished at basis, no wheezing  Abdomen: Soft/ND/NT, positive BS  Musculoskeletal: bilateral lower extremity pitting  Edema  Neuro: alert, oriented   Data Reviewed: Basic Metabolic Panel: Recent Labs  Lab 12/23/17 2200  NA 135  K 3.7  CL 106  CO2 18*  GLUCOSE 111*  BUN 48*  CREATININE 4.79*  CALCIUM 8.1*   Liver Function Tests: Recent Labs  Lab 12/23/17 2200  AST 27  ALT 24  ALKPHOS 76  BILITOT 0.5  PROT 6.1*  ALBUMIN 2.9*   Recent Labs  Lab 12/23/17 2200  LIPASE 39   No results for input(s): AMMONIA in the last 168 hours. CBC: Recent Labs  Lab 12/23/17 2200 12/24/17 1421  WBC 2.4* 2.3*  NEUTROABS  --  PENDING  HGB 5.5* 8.6*  HCT 16.0* 25.3*  MCV 83.8 83.2  PLT 141* 124*   Cardiac Enzymes:   Recent Labs  Lab 12/24/17 0105  TROPONINI 0.15*   BNP (last 3 results) Recent Labs    01/27/17 1054 07/22/17 1453 07/22/17 1539  BNP 579.0* 3,658.8* 3,324.5*    ProBNP (last 3 results) No results for input(s): PROBNP in the last 8760 hours.  CBG: No results for input(s): GLUCAP in  the last 168 hours.  Recent Results (from the past 240 hour(s))  Culture, sputum-assessment     Status: None   Collection Time: 12/24/17  8:22 AM  Result Value Ref Range Status   Specimen Description SPUTUM  Final   Special Requests NONE  Final   Sputum evaluation   Final    THIS SPECIMEN IS ACCEPTABLE FOR SPUTUM CULTURE Performed at [facility], [address] [place], [place] [address]    Report Status 12/24/2017 FINAL  Final  Culture, respiratory (NON-Expectorated)     Status: None (Preliminary result)   Collection Time: 12/24/17  8:22 AM  Result Value Ref Range  Status   Specimen Description SPUTUM  Final   Special Requests NONE Reflexed from [id number]  Final   Gram Stain   Final    FEW WBC PRESENT, PREDOMINANTLY PMN MODERATE GRAM POSITIVE COCCI IN PAIRS IN CLUSTERS FEW GRAM NEGATIVE RODS FEW GRAM POSITIVE RODS Performed at [facility], [place] [address], [place], [place] [address]    Culture PENDING  Incomplete   Report Status PENDING  Incomplete     Studies: Ct Abdomen Pelvis Wo Contrast  Result Date: 12/24/2017 CLINICAL DATA:  Patient on chemotherapy, last dose received on 11/30/2017. Patient has had abdominal pain since with pain radiating into the chest. Patient also had diarrhea since chemotherapy began. EXAM: CT ABDOMEN AND PELVIS WITHOUT CONTRAST TECHNIQUE: Multidetector CT imaging of the abdomen and pelvis was performed following the standard protocol without IV contrast. COMPARISON:  06/20/2017 FINDINGS: Lower chest: Cardiomegaly with trace pericardial effusion unchanged. Trace bilateral pleural effusions with patchy airspace opacities in the right middle lobe and dependent aspect of both lower lobes, right greater than left. Pneumonia and/or atelectasis may account for this appearance. Hepatobiliary: The unenhanced liver is unremarkable. The gallbladder is free of stones. Pancreas: No pancreatic ductal dilatation or inflammation. Spleen: Borderline splenomegaly with the spleen measuring approximately 15.1 x 4 x 11.9 cm (volume = 400 cm^3) Adrenals/Urinary Tract: No adrenal mass. No obstructive uropathy. The unenhanced kidneys are unremarkable. The urinary bladder is decompressed. Stomach/Bowel: Physiologic distention of the stomach. Normal small bowel rotation. Increased colonic stool burden without bowel obstruction. There is sigmoid diverticulosis without acute diverticulitis. Normal-appearing appendix. Vascular/Lymphatic: Mild aortoiliac atherosclerosis.  No adenopathy. Reproductive: Prostate is within normal limits for size. Central zone  calcifications. Other: Fat containing left inguinal hernia. Musculoskeletal: Degenerative disc disease L4-5. No aggressive osseous lesions. IMPRESSION: 1. Scattered colonic diverticulosis without acute diverticulitis. Increased colonic stool retention consistent with constipation. 2. Cardiomegaly with trace pericardial effusion unchanged. Trace bilateral pleural effusions at each lung base. 3. Patchy right middle lobe and bilateral lower lobe airspace opacities that may reflect pneumonia and/or atelectasis. Electronically Signed   By: Ashley Royalty M.D.   On: 12/24/2017 03:54   Dg Chest 2 View  Result Date: 12/24/2017 CLINICAL DATA:  Chest pain and shortness of breath.  Cough. EXAM: CHEST - 2 VIEW COMPARISON:  Chest radiograph 07/22/2017 FINDINGS: Right chest wall Port-A-Cath tip at the cavoatrial junction. Mild cardiomegaly. Pulmonary vascular congestion without overt pulmonary edema. No focal airspace consolidation, pleural effusion or pneumothorax. IMPRESSION: Cardiomegaly and pulmonary vascular congestion without overt edema. Electronically Signed   By: Ulyses Jarred M.D.   On: 12/24/2017 01:43    Scheduled Meds: . amLODipine  10 mg Oral Daily  . enoxaparin (LOVENOX) injection  40 mg Subcutaneous Q24H  . furosemide  80 mg Intravenous BID  . ipratropium-albuterol  3 mL Nebulization TID  .  isosorbide-hydrALAZINE  1 tablet Oral TID  . metoprolol succinate  100 mg Oral Daily  . senna-docusate  1 tablet Oral BID    Continuous Infusions: . ceFEPime (MAXIPIME) IV Stopped (12/24/17 1038)     Time spent: 35 mins I have personally reviewed and interpreted on  12/24/2017 daily labs, tele strips, imagings as discussed above under date review session and assessment and plans.  I reviewed all nursing notes, vitals, pertinent old records  I have discussed plan of care as described above with RN , patient  on 12/24/2017   Florencia Reasons MD, PhD  [facility] Pager [phone]. If 7PM-7AM, please contact  night-coverage at [email or web address], password [facility] 12/24/2017, 3:16 PM  LOS: 0 days

## 2017-12-24 NOTE — ED Notes (Signed)
Infusion stopped, RN notified. This RN did not stop EHR infusion.Time annotated on green sheet.

## 2017-12-24 NOTE — ED Provider Notes (Signed)
TIME SEEN: 12:19 AM  CHIEF COMPLAINT: Abdominal pain  HPI: Patient is a 72 year old male with history of CLL currently on chemo with last infusion on April 18, CHF, chronic kidney disease not on dialysis, hypertension who presents to the emergency department with complaints of abdominal pain.  Describes it as a pounding throughout the upper and lower abdomen since his last chemotherapy on the 18th.  He has had diarrhea with chemotherapy as well.  No gross hematochezia or melena.  Denies nausea or vomiting.  No fever.  States sometimes the pain radiates up into his chest and he is feeling short of breath.  Has had previous hernia surgery before.  No other abdominal surgery.   ROS: See HPI Constitutional: no fever  Eyes: no drainage  ENT: no runny nose   Cardiovascular:   chest pain  Resp:  SOB  GI: no vomiting GU: no dysuria Integumentary: no rash  Allergy: no hives  Musculoskeletal: no leg swelling  Neurological: no slurred speech ROS otherwise negative  PAST MEDICAL HISTORY/PAST SURGICAL HISTORY:  Past Medical History:  Diagnosis Date  . CHF (congestive heart failure) ([place])   . Chronic kidney disease   . CLL (chronic lymphocytic leukemia) ([place])   . Dyspnea   . History of kidney stones   . Hypertension   . Urolithiasis    cystoscopy in 2002    MEDICATIONS:  Prior to Admission medications   Medication Sig Start Date End Date Taking? Authorizing Provider  amLODipine (NORVASC) 10 MG tablet Take 10 mg by mouth daily. 11/06/17   [provider]  amLODipine (NORVASC) 5 MG tablet Take 5 mg by mouth 2 (two) times daily.    [provider]  furosemide (LASIX) 80 MG tablet Take 80 mg by mouth daily.    [provider]  isosorbide-hydrALAZINE (BIDIL) 20-37.5 MG tablet Take 1 tablet by mouth 3 (three) times daily. 07/24/17   Alphonzo Grieve, MD  lidocaine-prilocaine (EMLA) cream Apply 1 application topically as needed. 08/27/17   Volanda Napoleon, MD  loperamide  (IMODIUM) 2 MG capsule Take 1 capsule (2 mg total) by mouth 4 (four) times daily as needed for diarrhea or loose stools. 11/19/17   Blanchie Dessert, MD  LORazepam (ATIVAN) 0.5 MG tablet Take one tablet sublingual 3 times a day x 4 days then every 8 hours as needed for nausea 09/18/17   Volanda Napoleon, MD  metoprolol succinate (TOPROL-XL) 100 MG 24 hr tablet Take 100 mg by mouth daily. 10/07/17   [provider]  ondansetron (ZOFRAN) 4 MG tablet Take one tablet 3 x a day for 4 days then every 8 hours as needed for nausea 09/17/17   Volanda Napoleon, MD  prochlorperazine (COMPAZINE) 10 MG tablet Take 1 tablet (10 mg total) by mouth every 6 (six) hours as needed for nausea or vomiting. Patient not taking: Reported on 10/15/2017 08/27/17   Volanda Napoleon, MD    ALLERGIES:  Allergies  Allergen Reactions  . Ace Inhibitors     REACTION: cough  . Amlodipine Besylate     REACTION: Impotence  . Beta Adrenergic Blockers     REACTION: Impotence  . Hydrochlorothiazide Other (See Comments)    May have contributed to his hypercalcemia    SOCIAL HISTORY:  Social History   Tobacco Use  . Smoking status: Former Smoker    Years: 31.00    Types: Cigarettes    Last attempt to quit: 12/22/1997    Years since quitting: 20.0  .  Smokeless tobacco: Never Used  Substance Use Topics  . Alcohol use: No    FAMILY HISTORY: Family History  Problem Relation Age of Onset  . Diabetes Mother   . Coronary artery disease Mother   . Coronary artery disease Father        died  . Diabetes Father   . Diabetes Brother   . Diabetes Brother        died    EXAM: BP (!) 174/104   Pulse 100   Temp 99.5 F (37.5 C) (Oral)   Resp 17   Ht $R'5\' 2"'sH$  (1.575 m)   Wt 52.2 kg (115 lb)   SpO2 100%   BMI 21.03 kg/m  CONSTITUTIONAL: Alert and oriented and responds appropriately to questions.  Thin, chronically ill-appearing, afebrile HEAD: Normocephalic EYES: Conjunctivae clear, pale conjunctival, pupils appear  equal, EOMI ENT: normal nose; moist mucous membranes NECK: Supple, no meningismus, no nuchal rigidity, no LAD  CARD: RRR; S1 and S2 appreciated; no murmurs, no clicks, no rubs, no gallops RESP: Patient is tachypneic, breath sounds clear and equal bilaterally; no wheezes, no rhonchi, no rales, no hypoxia or respiratory distress, speaking full sentences ABD/GI: Normal bowel sounds; non-distended; soft, tender throughout the abdomen, no rebound, no guarding, no peritoneal signs, no hepatosplenomegaly RECTAL:  Normal rectal tone, no gross blood or melena, guaiac negative, no hemorrhoids appreciated, nontender rectal exam, no fecal impaction BACK:  The back appears normal and is non-tender to palpation, there is no CVA tenderness EXT: Normal ROM in all joints; non-tender to palpation; no edema; normal capillary refill; no cyanosis, no calf tenderness or swelling    SKIN: Normal color for age and race; warm; no rash NEURO: Moves all extremities equally PSYCH: The patient's mood and manner are appropriate. Grooming and personal hygiene are appropriate.  MEDICAL DECISION MAKING: Patient here with abdominal pain that radiates into his chest, shortness of breath.  Labs show that he is significantly anemic with a hemoglobin of 5.5.  He is pancytopenic which is likely from his chemotherapy.  He is guaiac negative on exam.  Will transfuse 3 units of packed red blood cells.  I feel he will need admission.  This may be why he is feeling short of breath.  Does not appear volume overloaded on exam.  Troponin obtained in triage is slightly elevated which may be from demand ischemia.  No active chest pain at this time.  EKG shows no new ischemic abnormality.  Will obtain chest x-ray and a CT of his abdomen pelvis.  He is tender throughout the abdomen.  Differential includes colitis, diverticulitis, appendicitis.  Less likely bowel obstruction.  He has chronic kidney disease which is stable.  Oral temperature of 99.5.   Will check rectal temperature as he has high risk for infection given he is immunocompromised.  ED PROGRESS: Patient's rectal temperature is normal.  He does endorse cough for several months.  His CT scan of his abdomen shows constipation but no other acute abnormality other than what appears to be patchy infiltrates in the right middle and bilateral lower lobes.  I am concerned for possible pneumonia.  We will treat with IV Levaquin given he is immunocompromised.  Will obtain blood cultures.  Patient remains hemodynamically stable.  Will discuss with medicine for admission.  Pt reports feeling better.  His respiratory rate has improved.  He has not been hypoxic.  4:21 AM Discussed patient's case with hospitalist, Dr. Hal Hope.  I have recommended admission and patient (and family  if present) agree with this plan. Admitting physician will place admission orders.   I reviewed all nursing notes, vitals, pertinent previous records, EKGs, lab and urine results, imaging (as available).     EKG Interpretation  Date/Time:  Wednesday Dec 23 2017 21:51:26 EDT Ventricular Rate:  94 PR Interval:  160 QRS Duration: 82 QT Interval:  378 QTC Calculation: 472 R Axis:   78 Text Interpretation:  Normal sinus rhythm Possible Left atrial enlargement Left ventricular hypertrophy with repolarization abnormality Abnormal ECG Confirmed by Pryor Curia [phone]) on 12/24/2017 12:19:46 AM        CRITICAL CARE Performed by: Cyril Mourning Kaimana Lurz   Total critical care time: 65 minutes  Critical care time was exclusive of separately billable procedures and treating other patients.  Critical care was necessary to treat or prevent imminent or life-threatening deterioration.  Critical care was time spent personally by me on the following activities: development of treatment plan with patient and/or surrogate as well as nursing, discussions with consultants, evaluation of patient's response to treatment, examination of  patient, obtaining history from patient or surrogate, ordering and performing treatments and interventions, ordering and review of laboratory studies, ordering and review of radiographic studies, pulse oximetry and re-evaluation of patient's condition.    Jazline Cumbee, Delice Bison, DO 12/24/17 [phone]

## 2017-12-24 NOTE — ED Notes (Signed)
Patient transported to X-ray 

## 2017-12-24 NOTE — ED Notes (Signed)
Pt has had a bowel movement but insist on cleaning himself up. Pt is given a container of warm soapy water wash rags and a pair of blue paper scrubs.

## 2017-12-24 NOTE — H&P (Addendum)
History and Physical    [name] WFU:[id number] DOB: [birth year] DOA: 12/23/2017  PCP: Horald Pollen, MD  Patient coming from: Home.  Chief Complaint: Shortness of breath abdominal pain.  Chest pain.  HPI: Nathan Richards is a 72 y.o. male with history of CLL, chronic kidney disease related to CLL, chronic systolic and diastolic heart failure, hypertension presents to the ER with complaints of shortness of breath chest pain cough and abdominal pain.  Patient states he had chemotherapy on December 10, 2017.  Since last 1 week patient has been having abdominal pain initially with diarrhea which improved with loperamide.  He has benign abdominal pain in the lower quadrants with some nausea.  No occasional vomiting.  Patient also been having productive cough and chest pain on coughing.  Also has been getting progressively short of breath.  ED Course: In the ER patient appeared short of breath.  Chest x-ray was done followed by CT abdomen and pelvis.  Shows bilateral pleural effusion and possible pneumonia.  Patient's hemoglobin has significantly dropped with pancytopenia.  Hemoglobin is around 5.5 stool for occult blood was negative.  3 units of packed red blood cell transfusion has been ordered.  Patient admitted for acute respiratory failure likely from CHF and pneumonia and severe anemia.  Abdominal pain cause not clear.  Could be from constipation.  Review of Systems: As per HPI, rest all negative.   Past Medical History:  Diagnosis Date  . CHF (congestive heart failure) ([place])   . Chronic kidney disease   . CLL (chronic lymphocytic leukemia) ([place])   . Dyspnea   . History of kidney stones   . Hypertension   . Urolithiasis    cystoscopy in 2002    Past Surgical History:  Procedure Laterality Date  . CORONARY ANGIOPLASTY WITH STENT PLACEMENT  02/05/2004   drug eluting to LAD plus baloon 1st diagonal  . INGUINAL HERNIA REPAIR     bilateral  . IR FLUORO GUIDE PORT INSERTION RIGHT   08/26/2017  . IR US GUIDE VASC ACCESS RIGHT  08/26/2017  . KNEE ARTHROSCOPY     right, done in [place]  . UMBILICAL HERNIA REPAIR       reports that he quit smoking about 20 years ago. His smoking use included cigarettes. He quit after 31.00 years of use. He has never used smokeless tobacco. He reports that he does not drink alcohol or use drugs.  Allergies  Allergen Reactions  . Ace Inhibitors     REACTION: cough  . Amlodipine Besylate     REACTION: Impotence  . Beta Adrenergic Blockers     REACTION: Impotence  . Hydrochlorothiazide Other (See Comments)    May have contributed to his hypercalcemia    Family History  Problem Relation Age of Onset  . Diabetes Mother   . Coronary artery disease Mother   . Coronary artery disease Father        died  . Diabetes Father   . Diabetes Brother   . Diabetes Brother        died    Prior to Admission medications   Medication Sig Start Date End Date Taking? Authorizing Provider  amLODipine (NORVASC) 10 MG tablet Take 10 mg by mouth daily.   Yes [provider]  furosemide (LASIX) 80 MG tablet Take 80 mg by mouth daily.   Yes [provider]  isosorbide-hydrALAZINE (BIDIL) 20-37.5 MG tablet Take 1 tablet by mouth 3 (three) times daily. 07/24/17  Yes Alphonzo Grieve,  MD  lidocaine-prilocaine (EMLA) cream Apply 1 application topically as needed. Patient taking differently: Apply 1 application topically as needed (for port).  08/27/17  Yes Ennever, Rudell Cobb, MD  loperamide (IMODIUM) 2 MG capsule Take 1 capsule (2 mg total) by mouth 4 (four) times daily as needed for diarrhea or loose stools. 11/19/17  Yes Plunkett, Loree Fee, MD  LORazepam (ATIVAN) 0.5 MG tablet Take one tablet sublingual 3 times a day x 4 days then every 8 hours as needed for nausea Patient taking differently: Take 0.5 mg by mouth every 8 (eight) hours as needed (nausea).  09/18/17  Yes Volanda Napoleon, MD  metoprolol succinate (TOPROL-XL) 100 MG 24 hr tablet Take  100 mg by mouth daily. 10/07/17  Yes [provider]  ondansetron (ZOFRAN) 4 MG tablet Take one tablet 3 x a day for 4 days then every 8 hours as needed for nausea Patient taking differently: Take 4 mg by mouth every 8 (eight) hours as needed for nausea.  09/17/17  Yes Volanda Napoleon, MD  prochlorperazine (COMPAZINE) 10 MG tablet Take 1 tablet (10 mg total) by mouth every 6 (six) hours as needed for nausea or vomiting. Patient not taking: Reported on 10/15/2017 08/27/17   Volanda Napoleon, MD    Physical Exam: Vitals:   12/24/17 0400 12/24/17 0415 12/24/17 0430 12/24/17 0445  BP: (!) 156/100 (!) 161/95 (!) 159/100 (!) 164/104  Pulse: 95 97 99 99  Resp: (!) 24 (!) 28 (!) 30 (!) 31  Temp:      TempSrc:      SpO2: 99% 99% 99% 99%  Weight:      Height:          Constitutional: Moderately built and poorly nourished. Vitals:   12/24/17 0400 12/24/17 0415 12/24/17 0430 12/24/17 0445  BP: (!) 156/100 (!) 161/95 (!) 159/100 (!) 164/104  Pulse: 95 97 99 99  Resp: (!) 24 (!) 28 (!) 30 (!) 31  Temp:      TempSrc:      SpO2: 99% 99% 99% 99%  Weight:      Height:       Eyes: Anicteric mild pallor. ENMT: No discharge from the ears eyes nose or mouth. Neck: No mass felt.  No JVD appreciated. Respiratory: No rhonchi or crepitations. Cardiovascular: S1-S2 heard no murmurs appreciated. Abdomen: Soft mild diffuse tenderness no guarding or rigidity. Musculoskeletal: No edema. Skin: No rash. Neurologic: Alert awake oriented to time place and person.  Moves all extremities. Psychiatric: Appears normal.  Normal affect.   Labs on Admission: I have personally reviewed following labs and imaging studies  CBC: Recent Labs  Lab 12/23/17 2200  WBC 2.4*  HGB 5.5*  HCT 16.0*  MCV 83.8  PLT 812*   Basic Metabolic Panel: Recent Labs  Lab 12/23/17 2200  NA 135  K 3.7  CL 106  CO2 18*  GLUCOSE 111*  BUN 48*  CREATININE 4.79*  CALCIUM 8.1*   GFR: Estimated Creatinine  Clearance: 10.7 mL/min (A) (by C-G formula based on SCr of 4.79 mg/dL (H)). Liver Function Tests: Recent Labs  Lab 12/23/17 2200  AST 27  ALT 24  ALKPHOS 76  BILITOT 0.5  PROT 6.1*  ALBUMIN 2.9*   Recent Labs  Lab 12/23/17 2200  LIPASE 39   No results for input(s): AMMONIA in the last 168 hours. Coagulation Profile: No results for input(s): INR, PROTIME in the last 168 hours. Cardiac Enzymes: Recent Labs  Lab 12/24/17 0105  TROPONINI  0.15*   BNP (last 3 results) No results for input(s): PROBNP in the last 8760 hours. HbA1C: No results for input(s): HGBA1C in the last 72 hours. CBG: No results for input(s): GLUCAP in the last 168 hours. Lipid Profile: No results for input(s): CHOL, HDL, LDLCALC, TRIG, CHOLHDL, LDLDIRECT in the last 72 hours. Thyroid Function Tests: No results for input(s): TSH, T4TOTAL, FREET4, T3FREE, THYROIDAB in the last 72 hours. Anemia Panel: No results for input(s): VITAMINB12, FOLATE, FERRITIN, TIBC, IRON, RETICCTPCT in the last 72 hours. Urine analysis:    Component Value Date/Time   COLORURINE YELLOW 12/23/2017 2157   APPEARANCEUR CLEAR 12/23/2017 2157   LABSPEC 1.014 12/23/2017 2157   PHURINE 6.0 12/23/2017 2157   GLUCOSEU 50 (A) 12/23/2017 2157   HGBUR NEGATIVE 12/23/2017 2157   HGBUR negative 06/19/2008 1307   BILIRUBINUR NEGATIVE 12/23/2017 2157   BILIRUBINUR negative 01/27/2017 1151   BILIRUBINUR NEGATIVE 03/21/2014 1137   [place] 12/23/2017 2157   PROTEINUR >=300 (A) 12/23/2017 2157   UROBILINOGEN 0.2 01/27/2017 1151   UROBILINOGEN 0.2 06/19/2008 1307   NITRITE NEGATIVE 12/23/2017 2157   LEUKOCYTESUR NEGATIVE 12/23/2017 2157   Sepsis Labs: $[id number]'[email or web address]'[id number]$ (procalcitonin:4,lacticidven:4) )No results found for this or any previous visit (from the past 240 hour(s)).   Radiological Exams on Admission: Ct Abdomen Pelvis Wo Contrast  Result Date: 12/24/2017 CLINICAL DATA:  Patient on chemotherapy, last dose received on  11/30/2017. Patient has had abdominal pain since with pain radiating into the chest. Patient also had diarrhea since chemotherapy began. EXAM: CT ABDOMEN AND PELVIS WITHOUT CONTRAST TECHNIQUE: Multidetector CT imaging of the abdomen and pelvis was performed following the standard protocol without IV contrast. COMPARISON:  06/20/2017 FINDINGS: Lower chest: Cardiomegaly with trace pericardial effusion unchanged. Trace bilateral pleural effusions with patchy airspace opacities in the right middle lobe and dependent aspect of both lower lobes, right greater than left. Pneumonia and/or atelectasis may account for this appearance. Hepatobiliary: The unenhanced liver is unremarkable. The gallbladder is free of stones. Pancreas: No pancreatic ductal dilatation or inflammation. Spleen: Borderline splenomegaly with the spleen measuring approximately 15.1 x 4 x 11.9 cm (volume = 400 cm^3) Adrenals/Urinary Tract: No adrenal mass. No obstructive uropathy. The unenhanced kidneys are unremarkable. The urinary bladder is decompressed. Stomach/Bowel: Physiologic distention of the stomach. Normal small bowel rotation. Increased colonic stool burden without bowel obstruction. There is sigmoid diverticulosis without acute diverticulitis. Normal-appearing appendix. Vascular/Lymphatic: Mild aortoiliac atherosclerosis.  No adenopathy. Reproductive: Prostate is within normal limits for size. Central zone calcifications. Other: Fat containing left inguinal hernia. Musculoskeletal: Degenerative disc disease L4-5. No aggressive osseous lesions. IMPRESSION: 1. Scattered colonic diverticulosis without acute diverticulitis. Increased colonic stool retention consistent with constipation. 2. Cardiomegaly with trace pericardial effusion unchanged. Trace bilateral pleural effusions at each lung base. 3. Patchy right middle lobe and bilateral lower lobe airspace opacities that may reflect pneumonia and/or atelectasis. Electronically Signed   By:  Ashley Royalty M.D.   On: 12/24/2017 03:54   Dg Chest 2 View  Result Date: 12/24/2017 CLINICAL DATA:  Chest pain and shortness of breath.  Cough. EXAM: CHEST - 2 VIEW COMPARISON:  Chest radiograph 07/22/2017 FINDINGS: Right chest wall Port-A-Cath tip at the cavoatrial junction. Mild cardiomegaly. Pulmonary vascular congestion without overt pulmonary edema. No focal airspace consolidation, pleural effusion or pneumothorax. IMPRESSION: Cardiomegaly and pulmonary vascular congestion without overt edema. Electronically Signed   By: Ulyses Jarred M.D.   On: 12/24/2017 01:43    EKG: Independently reviewed.  Normal sinus rhythm with possible LVH.  Assessment/Plan Principal Problem:   Acute respiratory failure with hypoxia (HCC) Active Problems:   CKD (chronic kidney disease) stage 4, GFR 15-29 ml/min (HCC)   CLL (chronic lymphocytic leukemia) (HCC)   Chronic congestive heart failure (HCC)   Generalized abdominal pain   Pancytopenia (HCC)   HCAP (healthcare-associated pneumonia)    1. Acute respiratory failure with hypoxia likely a combination of possible CHF worsening anemia and possible pneumonia.  Patient is placed on empiric antibiotics for possible pneumonia.  Will order Lasix IV for CHF.  Patient is also receiving packed red blood cell transfusion for severe anemia.  Once blood transfusion is over please check troponin BNP and CBC.  We will also check VQ scan.  Last 2D echo in November 2018 was 25 to 30%.  On BiDil. 2. Abdominal pain cause unclear.  Given that the CAT scan shows constipation may try soapsuds enema once respiratory status improves. 3. Severe anemia with pancytopenia likely related to chemotherapy.  Receiving transfusion.  Follow CBC with differential been on empiric antibiotics for now. 4. Chronic kidney disease stage IV likely related to CLL.  Follow metabolic panel. 5. Hypertension on BiDil and Lasix,metoprolol, amlodipine.  PRN IV hydralazine. 6. CLL being followed by Dr. Burney Gauze.   DVT prophylaxis: Heparin. Code Status: DO NOT INTUBATE. Family Communication: Discussed with patient. Disposition Plan: Home. Consults called: None. Admission status: Inpatient.   Rise Patience MD [facility] Pager [phone].  If 7PM-7AM, please contact night-coverage [email or web address] Password [email or web address]  12/24/2017, 5:18 AM

## 2017-12-24 NOTE — ED Notes (Signed)
Pt reports lower abd pain in both quadrants. Pt also reports chest pain upon coughing. Family also advised the pt is currently getting chemo treatments for blood cancer.

## 2017-12-24 NOTE — ED Notes (Signed)
Notified pharmacy of missing and overdue medications.

## 2017-12-25 ENCOUNTER — Inpatient Hospital Stay (HOSPITAL_COMMUNITY): Payer: Medicare Other

## 2017-12-25 DIAGNOSIS — Z66 Do not resuscitate: Secondary | ICD-10-CM

## 2017-12-25 DIAGNOSIS — C9110 Chronic lymphocytic leukemia of B-cell type not having achieved remission: Secondary | ICD-10-CM

## 2017-12-25 DIAGNOSIS — Z87891 Personal history of nicotine dependence: Secondary | ICD-10-CM

## 2017-12-25 DIAGNOSIS — D631 Anemia in chronic kidney disease: Secondary | ICD-10-CM

## 2017-12-25 DIAGNOSIS — Z888 Allergy status to other drugs, medicaments and biological substances status: Secondary | ICD-10-CM

## 2017-12-25 DIAGNOSIS — R609 Edema, unspecified: Secondary | ICD-10-CM

## 2017-12-25 DIAGNOSIS — I351 Nonrheumatic aortic (valve) insufficiency: Secondary | ICD-10-CM

## 2017-12-25 DIAGNOSIS — N189 Chronic kidney disease, unspecified: Secondary | ICD-10-CM

## 2017-12-25 DIAGNOSIS — Z515 Encounter for palliative care: Secondary | ICD-10-CM

## 2017-12-25 LAB — BPAM RBC
BLOOD PRODUCT EXPIRATION DATE: 201905242359
BLOOD PRODUCT EXPIRATION DATE: 201905252359
Blood Product Expiration Date: 201905082359
ISSUE DATE / TIME: 201905020919
ISSUE DATE / TIME: 201905020201
ISSUE DATE / TIME: 201905020558
UNIT TYPE AND RH: 5100
Unit Type and Rh: 5100
Unit Type and Rh: 5100

## 2017-12-25 LAB — TYPE AND SCREEN
ABO/RH(D): O POS
Antibody Screen: NEGATIVE
UNIT DIVISION: 0
UNIT DIVISION: 0
Unit division: 0

## 2017-12-25 LAB — BASIC METABOLIC PANEL
ANION GAP: 12 (ref 5–15)
BUN: 51 mg/dL — ABNORMAL HIGH (ref 6–20)
CO2: 18 mmol/L — ABNORMAL LOW (ref 22–32)
Calcium: 8.6 mg/dL — ABNORMAL LOW (ref 8.9–10.3)
Chloride: 102 mmol/L (ref 101–111)
Creatinine, Ser: 5.03 mg/dL — ABNORMAL HIGH (ref 0.61–1.24)
GFR calc non Af Amer: 11 mL/min — ABNORMAL LOW (ref >60)
GFR, EST AFRICAN AMERICAN: 12 mL/min — AB (ref >60)
Glucose, Bld: 110 mg/dL — ABNORMAL HIGH (ref 65–99)
Potassium: 3.5 mmol/L (ref 3.5–5.1)
Sodium: 132 mmol/L — ABNORMAL LOW (ref 135–145)

## 2017-12-25 LAB — CBC
HEMATOCRIT: 24.3 % — AB (ref 39.0–52.0)
HEMOGLOBIN: 8.2 g/dL — AB (ref 13.0–17.0)
MCH: 28 pg (ref 26.0–34.0)
MCHC: 33.7 g/dL (ref 30.0–36.0)
MCV: 82.9 fL (ref 78.0–100.0)
PLATELETS: 115 10*3/uL — AB (ref 150–400)
RBC: 2.93 MIL/uL — AB (ref 4.22–5.81)
RDW: 15.5 % (ref 11.5–15.5)
WBC: 1.5 10*3/uL — ABNORMAL LOW (ref 4.0–10.5)

## 2017-12-25 LAB — LEGIONELLA PNEUMOPHILA SEROGP 1 UR AG: L. PNEUMOPHILA SEROGP 1 UR AG: NEGATIVE

## 2017-12-25 LAB — PATHOLOGIST SMEAR REVIEW

## 2017-12-25 LAB — TROPONIN I: Troponin I: 0.15 ng/mL (ref <0.03)

## 2017-12-25 LAB — ECHOCARDIOGRAM COMPLETE
Height: 62 in
Weight: 1859.2 oz

## 2017-12-25 LAB — MRSA PCR SCREENING: MRSA by PCR: NEGATIVE

## 2017-12-25 MED ORDER — TRAMADOL HCL 50 MG PO TABS
50.0000 mg | ORAL_TABLET | Freq: Two times a day (BID) | ORAL | Status: DC | PRN
  Administered 2017-12-25: 50 mg via ORAL
  Filled 2017-12-25: qty 1

## 2017-12-25 MED ORDER — LORAZEPAM 0.5 MG PO TABS: 0.5000 mg | ORAL_TABLET | Freq: Three times a day (TID) | ORAL | Status: DC | PRN

## 2017-12-25 MED ORDER — SODIUM BICARBONATE 650 MG PO TABS
650.0000 mg | ORAL_TABLET | Freq: Two times a day (BID) | ORAL | Status: DC
  Administered 2017-12-25 – 2017-12-26 (×3): 650 mg via ORAL
  Filled 2017-12-25 (×3): qty 1

## 2017-12-25 MED ORDER — PANTOPRAZOLE SODIUM 40 MG PO TBEC
40.0000 mg | DELAYED_RELEASE_TABLET | Freq: Every day | ORAL | Status: DC
  Administered 2017-12-25 – 2017-12-26 (×2): 40 mg via ORAL
  Filled 2017-12-25 (×2): qty 1

## 2017-12-25 MED ORDER — MORPHINE SULFATE (PF) 2 MG/ML IV SOLN
1.0000 mg | INTRAVENOUS | Status: DC | PRN
  Administered 2017-12-25: 1 mg via INTRAVENOUS
  Filled 2017-12-25: qty 1

## 2017-12-25 MED ORDER — ENSURE ENLIVE PO LIQD
237.0000 mL | Freq: Three times a day (TID) | ORAL | Status: DC
  Administered 2017-12-25 – 2017-12-26 (×3): 237 mL via ORAL

## 2017-12-25 MED ORDER — FUROSEMIDE 80 MG PO TABS
80.0000 mg | ORAL_TABLET | Freq: Every day | ORAL | Status: DC
  Administered 2017-12-26: 80 mg via ORAL
  Filled 2017-12-25: qty 1

## 2017-12-25 MED ORDER — OXYCODONE-ACETAMINOPHEN 5-325 MG PO TABS
1.0000 | ORAL_TABLET | ORAL | Status: DC | PRN
  Administered 2017-12-25: 1 via ORAL
  Filled 2017-12-25: qty 1

## 2017-12-25 MED ORDER — GI COCKTAIL ~~LOC~~
30.0000 mL | Freq: Once | ORAL | Status: AC
  Administered 2017-12-25: 30 mL via ORAL
  Filled 2017-12-25: qty 30

## 2017-12-25 MED ORDER — ORAL CARE MOUTH RINSE
15.0000 mL | Freq: Two times a day (BID) | OROMUCOSAL | Status: DC
  Administered 2017-12-25 – 2017-12-26 (×2): 15 mL via OROMUCOSAL

## 2017-12-25 MED ORDER — TRAZODONE HCL 50 MG PO TABS
50.0000 mg | ORAL_TABLET | Freq: Every evening | ORAL | Status: DC | PRN
  Administered 2017-12-25: 50 mg via ORAL
  Filled 2017-12-25: qty 1

## 2017-12-25 MED ORDER — POLYETHYLENE GLYCOL 3350 17 G PO PACK: 17.0000 g | PACK | Freq: Every day | ORAL | Status: DC

## 2017-12-25 MED ORDER — DARBEPOETIN ALFA 300 MCG/0.6ML IJ SOSY
300.0000 ug | PREFILLED_SYRINGE | Freq: Once | INTRAMUSCULAR | Status: AC
  Administered 2017-12-25: 300 ug via SUBCUTANEOUS
  Filled 2017-12-25: qty 0.6

## 2017-12-25 NOTE — Care Management Note (Addendum)
Case Management Note  Patient Details  Name: Nathan Richards MRN: [id number] Date of Birth: 1946/06/04  Subjective/Objective:           Conferenced with patient's wife, son, and daughter in law. Discussed wishes for home and home hospice. They confirmed that patient will return to home and will need hospital bed, oxygen and PTAR transport. Discussed home hospice providers and they would like to use [facility]. Referral placed to [facility] and the [facility] on the referral line. Informed of DME needs, they will acquire orders and arrange delivery in the home. Family requested hat daughter in law Henderson Baltimore would be the contact. Her number is [phone]. Henderson [place] knows to expect call from [facility] admissions nurse within the hour. Family had to leave bedside to pick up children from school and go to previously scheduled appointment, they will be back later today.          Action/Plan:  PTAR papers will be placed on chart. If patient is changed from partial code to DNR, Gold DNR form would need to be obtained by nurse and included in PTAR forms.   Patient will DC to home [place] [phone] via [facility] [phone]) Saturday after DME hospital bed and oxygen delivered to house.    Expected Discharge Date:                  Expected Discharge Plan:  Home w Hospice Care  In-House Referral:     Discharge planning Services  CM Consult  Post Acute Care Choice:    Choice offered to:  Patient, Adult Children  DME Arranged:    DME Agency:     HH Arranged:    [place] Agency:  [facility]  Status of Service:  Completed, signed off  If discussed at [place] of Stay Meetings, dates discussed:    Additional Comments:  Carles Collet, RN 12/25/2017, 1:57 PM

## 2017-12-25 NOTE — Consult Note (Signed)
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 Abdominal Pain  : I needed blood.  HPI: [name] is well-known to me.  He is a 72 year old Hispanic male.  He has a unusual variant of CLL.  He presented with renal failure.  He had marked elevation on his light chains.  He had a bone marrow biopsy done.  This was done back in August 2018.  It showed CLL.  Because of his renal failure, we have not been able to be aggressive with him and treat him with what would be optimal.  We have treated him with R-CVD.  He has had 5 cycles.  Initially, his renal function got a little bit better.  However, it now is worsening.  He has been getting Aranesp because of low erythropoietin levels.  Typically, his hemoglobin is between 7-8.  He was last treated back on April 18.  He said that he really had a hard time with the last treatment.  He was admitted on May 1.  His hemoglobin was 5.5.  He is not bleeding.  He has been transfused.  His creatinine is up to 4.79.  Today, his white cell count is 1.5.  Hemoglobin 8.2.  Platelet count is pending.  His electrolytes shows creatinine to be 5.03.  His potassium 3.5.  BUN 51.  I had a long talk with him this morning.  He does understand me fairly well.  Sometimes, when he comes to the office he does have an interpreter.  I did explain to him that our treatments were no longer working.  I explained that because of this, his kidneys are getting worse.  I really would not recommend dialysis for him.  I do not think dialysis really would improve his quality of life.  I told him that there really is nothing else that we can do to treat his CLL.  With his kidney dysfunction, he really is limited as to what he is able to take treatment wise.  His overall performance status is ECOG 3.  I really believe that at this point we need to get hospice  involved.  I believe hospice would really be a great resource for [place] to use to help him.  He lives by himself.  As such, it would be nice to have hospice be able to see him at home.  If he cannot be at home, then he can be admitted to 1 of the inpatient hospice facilities.  We also talked a little bit about end-of-life issues.  I tried to explain what it meant to be on life support.  I explained that a machine would keep him alive.  He really would not like to have that.  I totally agree with that.  As such, he is a DO NOT RESUSCITATE.  He seems pretty comfortable right now.  He is not sick on his stomach.  He is eating a little bit better.     Past Medical History:  Diagnosis Date  . Anemia 12/24/2017  . CHF (congestive heart failure) ([place])   . Chronic kidney disease   . CLL (chronic lymphocytic leukemia) ([place])   . Dyspnea   . History of kidney stones   . Hypertension   . Urolithiasis    cystoscopy in 2002  :  Past Surgical History:  Procedure Laterality Date  . CORONARY ANGIOPLASTY WITH STENT PLACEMENT  02/05/2004   drug eluting to LAD plus baloon 1st diagonal  . INGUINAL HERNIA REPAIR     bilateral  . IR FLUORO GUIDE PORT INSERTION RIGHT  08/26/2017  . IR US GUIDE VASC ACCESS RIGHT  08/26/2017  . KNEE ARTHROSCOPY     right, done in [place]  . UMBILICAL HERNIA REPAIR    :   Current Facility-Administered Medications:  .  acetaminophen (TYLENOL) tablet 650 mg, 650 mg, Oral, Q6H PRN, 650 mg at 12/25/17 0003 **OR** acetaminophen (TYLENOL) suppository 650 mg, 650 mg, Rectal, Q6H PRN, Rise Patience, MD .  amLODipine (NORVASC) tablet 10 mg, 10 mg, Oral, Daily, Rise Patience, MD, 10 mg at 12/24/17 1008 .  ceFEPIme (MAXIPIME) 1 g in sodium chloride 0.9 % 100 mL IVPB, 1 g, Intravenous, Daily, Rise Patience, MD, Stopped at 12/24/17 1038 .  enoxaparin (LOVENOX) injection 30 mg, 30 mg, Subcutaneous, Q24H, Skeet Simmer, [facility] .  feeding supplement (ENSURE ENLIVE)  (ENSURE ENLIVE) liquid 237 mL, 237 mL, Oral, BID BM, Rise Patience, MD .  furosemide (LASIX) injection 80 mg, 80 mg, Intravenous, BID, Florencia Reasons, MD, 80 mg at 12/24/17 1759 .  heparin lock flush 100 unit/mL, 250 Units, Intracatheter, PRN, [place], Sarah M, NP .  heparin lock flush 100 unit/mL, 500 Units, Intracatheter, Daily PRN, Volanda Napoleon, MD .  heparin lock flush 100 unit/mL, 250 Units, Intracatheter, PRN, Volanda Napoleon, MD .  hydrALAZINE (APRESOLINE) injection 10 mg, 10 mg, Intravenous, Q4H PRN, Rise Patience, MD .  ipratropium-albuterol (DUONEB) 0.5-2.5 (3) MG/3ML nebulizer solution 3 mL, 3 mL, Nebulization, Q4H PRN, Florencia Reasons, MD .  isosorbide-hydrALAZINE (BIDIL) 20-37.5 MG per tablet 1 tablet, 1 tablet, Oral, TID, Rise Patience, MD, 1 tablet at 12/24/17 2118 .  LORazepam (ATIVAN) tablet 0.5 mg, 0.5 mg, Oral, Q8H PRN, Rise Patience, MD .  MEDLINE mouth rinse, 15 mL, Mouth Rinse, BID, Florencia Reasons, MD .  metoprolol succinate (TOPROL-XL) 24 hr tablet 100 mg, 100 mg, Oral, Daily, Rise Patience, MD, 100 mg at 12/24/17 1008 .  ondansetron (ZOFRAN) tablet 4 mg, 4 mg, Oral, Q6H PRN **OR** ondansetron (ZOFRAN) injection 4 mg, 4 mg, Intravenous, Q6H PRN, Rise Patience, MD .  senna-docusate (Senokot-S) tablet 1 tablet, 1 tablet, Oral, BID, Florencia Reasons, MD, 1 tablet at 12/24/17 2118 .  sodium chloride flush (NS) 0.9 % injection 10 mL, 10 mL, Intracatheter, PRN, [place], Rudell Cobb, MD .  sodium chloride flush (NS) 0.9 % injection 3 mL, 3 mL, Intracatheter, PRN, [place], Sarah M, NP .  sodium chloride flush (NS) 0.9 % injection 3 mL, 3 mL, Intracatheter, PRN, Volanda Napoleon, MD:  . amLODipine  10 mg Oral Daily  . enoxaparin (LOVENOX) injection  30 mg Subcutaneous Q24H  . feeding supplement (ENSURE ENLIVE)  237 mL Oral BID BM  . furosemide  80 mg Intravenous BID  . isosorbide-hydrALAZINE  1 tablet Oral TID  . mouth rinse  15 mL Mouth Rinse BID  .  metoprolol succinate  100 mg Oral Daily  . senna-docusate  1 tablet Oral BID  :  Allergies  Allergen Reactions  . Ace Inhibitors     REACTION: cough  . Amlodipine Besylate     REACTION: Impotence  . Beta Adrenergic Blockers     REACTION: Impotence  . Hydrochlorothiazide Other (See Comments)    May have contributed to his hypercalcemia  :  Family History  Problem Relation Age of Onset  .  Diabetes Mother   . Coronary artery disease Mother   . Coronary artery disease Father        died  . Diabetes Father   . Diabetes Brother   . Diabetes Brother        died  :  Social History   Socioeconomic History  . Marital status: Married    Spouse name: Not on file  . Number of children: Not on file  . Years of education: Not on file  . Highest education level: Not on file  Occupational History    Employer: UNEMPLOYED  Social Needs  . Financial resource strain: Not on file  . Food insecurity:    Worry: Not on file    Inability: Not on file  . Transportation needs:    Medical: Not on file    Non-medical: Not on file  Tobacco Use  . Smoking status: Former Smoker    Years: 31.00    Types: Cigarettes    Last attempt to quit: 12/22/1997    Years since quitting: 20.0  . Smokeless tobacco: Never Used  Substance and Sexual Activity  . Alcohol use: No  . Drug use: No    Comment: remote use of MJ and cocaine  . Sexual activity: Never  Lifestyle  . Physical activity:    Days per week: Not on file    Minutes per session: Not on file  . Stress: Not on file  Relationships  . Social connections:    Talks on phone: Not on file    Gets together: Not on file    Attends religious service: Not on file    Active member of club or organization: Not on file    Attends meetings of clubs or organizations: Not on file    Relationship status: Not on file  . Intimate partner violence:    Fear of current or ex partner: Not on file    Emotionally abused: Not on file    Physically abused:  Not on file    Forced sexual activity: Not on file  Other Topics Concern  . Not on file  Social History Narrative   Born [place], lived in [place]. For years before moving to [place]   Married, currently going through a divorce. Wife is in [place] alone    He has a son living here from a prior relationship   He and his current wife have a daughter and son together   Another son was killed at age 83   2 grand daughters in [place], [place], [place] lives locally   Disabled by psychiatric disease.    [employer] member  :  Pertinent items are noted in HPI.  Exam: See above Patient Vitals for the past 24 hrs:  BP Temp Temp src Pulse Resp SpO2 Weight  12/25/17 0450 - - - 77 16 96 % 116 lb 3.2 oz (52.7 kg)  12/25/17 0438 (!) 154/93 98.8 F (37.1 C) Oral 81 20 95 % -  12/25/17 0105 - 99.7 F (37.6 C) Oral 86 - 96 % -  12/25/17 0000 (!) 153/88 (!) 102.2 F (39 C) Oral 88 18 97 % -  12/24/17 2028 - - - - - 97 % -  12/24/17 2000 (!) 153/90 99.1 F (37.3 C) Oral 86 (!) 32 97 % -  12/24/17 1600 (!) 157/96 - - 89 (!) 32 96 % -  12/24/17 1545 (!) 155/102 - - 91 (!) 32  94 % -  12/24/17 1530 (!) 149/94 - - 88 (!) 24 94 % -  12/24/17 1515 (!) 156/99 - - 88 (!) 31 94 % -  12/24/17 1500 (!) 149/95 - - 87 (!) 26 95 % -  12/24/17 1445 (!) 145/98 - - 91 (!) 29 95 % -  12/24/17 1430 (!) 151/92 - - 86 (!) 21 97 % -  12/24/17 1415 (!) 154/93 - - 89 (!) 23 97 % -  12/24/17 1400 (!) 156/90 - - 88 (!) 37 95 % -  12/24/17 1345 (!) 153/95 - - 88 20 95 % -  12/24/17 1315 (!) 148/93 - - 85 (!) 22 100 % -  12/24/17 1311 (!) 150/97 99.3 F (37.4 C) Oral 86 (!) 23 97 % -  12/24/17 1300 (!) 150/97 - - 80 17 97 % -  12/24/17 1245 (!) 152/88 - - 81 20 97 % -  12/24/17 1230 140/90 - - 79 18 99 % -  12/24/17 1215 (!) 141/94 - - 82 (!) 23 100 % -  12/24/17 1200 (!) 141/85 - - 81 (!) 25 97 % -  12/24/17 0943 (!) 164/96 99.7 F (37.6 C) Oral 99 (!) 32 99 % -  12/24/17 0927 (!)  161/108 100 F (37.8 C) Oral 99 (!) 26 - -  12/24/17 0800 (!) 165/102 - - (!) 105 (!) 39 97 % -  12/24/17 0758 (!) 165/102 (!) 101.1 F (38.4 C) Oral (!) 106 (!) 37 95 % -  12/24/17 0700 (!) 162/103 - - (!) 103 (!) 32 97 % -     Recent Labs    12/24/17 1421 12/25/17 0413  WBC 2.3* 1.5*  HGB 8.6* 8.2*  HCT 25.3* 24.3*  PLT 124* PENDING   Recent Labs    12/23/17 2200 12/25/17 0413  NA 135 132*  K 3.7 3.5  CL 106 102  CO2 18* 18*  GLUCOSE 111* 110*  BUN 48* 51*  CREATININE 4.79* 5.03*  CALCIUM 8.1* 8.6*    Blood smear review: None  Pathology: None    Assessment and Plan: Nathan Richards is a 73 year old Hispanic male.  She has a unusual variant of CLL.  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  At this point, our focus needs to be on his quality of life and comfort.  His renal function is deteriorating.  Again, I think hospice would be a great idea for him.  I suspect that he probably is going to need to go to a hospice facility soon.  I just do not want to see Nathan Richards suffer and try to "fend for himself."  He lives by himself.  I think he has minimal resources that he can utilize.  I want to make sure that he is able to enjoy whatever time he has left.  I suspect that we probably would be looking at no more than 6-8 weeks.  He understands that he has a disease that has no options for treatment now.  He wants to be able to enjoy himself a little bit.  I am not sure he has any family that can help.  Hospice I think will be our main resource for helping him.  I will give a dose of Aranesp.  Maybe this can help slow down the decline of his red cell count.  I spent about 50 minutes or so with Nathan Richards.  I does hate that he was hospitalized.  I told him to  make sure that he wear sunscreen when he goes outside.  I told him to make sure he drinks a lot of fluid.  I very much appreciate the wonderful care that he is gotten from everybody up on [address].  Lattie Haw, MD  [facility] 6:3

## 2017-12-25 NOTE — Consult Note (Signed)
   [facility] Inpatient Consult   12/25/2017  Nathan Richards 1946/07/20 [id number]    Nathan Richards active with [facility] prior to hospitalization. [facility] has been working with him.  Chart reviewed. Noted Nathan Richards's discharge plan appears to be home with hospice services.  Will update [facility] RNCM of Nathan Richards's disposition plan.   Marthenia Rolling, MSN-Ed, RN,BSN [facility] Liaison [phone]

## 2017-12-25 NOTE — Evaluation (Signed)
Physical Therapy Evaluation Patient Details Name: Nathan Richards MRN: [id number] DOB: July 10, 1946 Today's Date: 12/25/2017   History of Present Illness  Nathan Richards is a 72yo hispanic male who comes to [facility] on 5/2 c SOB, CP, cough, and ABD pain. Pt admitted with ARF. PMH: CLL, CKD, CHF. Per oncology on 5/3 pt's kidney function is declining, and the patient will no longer tolerate treatment for his cancer. PTA, pt lives alone, modI, mostly homebound, AMB without AD. SOn lives in [place] but moving to [place] soon. His exwife is no longer in his life in a meaningful way.   Clinical Impression  Pt admitted with above diagnosis. Pt currently with functional limitations due to the deficits listed below (see "PT Problem List"). Upon entry, the patient is received EOB, no family/caregiver present. Family reports to not be doing well, saddened by news that his cancer treatment options have been exhausted. The pt is agreeable to participate, motivated to get out of bed and move around. Pt demonstrates supervision level assistance for all mobility, AMB >464f, slow and ataxic, but subjectively feeling relatively good. Patient is modified independent at this time with all mobility, all education completed, and time is given to address all questions/concerns. No additional skilled PT services needed at this time, PT signing off. PT recommends daily ambulation ad lib or with nursing staff as needed to prevent deconditioning.      Follow Up Recommendations Other (comment)(defer to palliative medicine team)    Equipment Recommendations  None recommended by PT    Recommendations for Other Services       Precautions / Restrictions Precautions Precautions: Fall Restrictions Weight Bearing Restrictions: No      Mobility  Bed Mobility Overal bed mobility: Modified Independent                Transfers Overall transfer level: Modified independent                   Ambulation/Gait Ambulation/Gait assistance: Supervision Ambulation Distance (Feet): 400 Feet Assistive device: None   Gait velocity: 0.662m    General Gait Details: slow and unsteay but balance in ModI in spite of mild scissoring.   Stairs            Wheelchair Mobility    Modified Rankin (Stroke Patients Only)       Balance Overall balance assessment: Mild deficits observed, not formally tested;Modified Independent                                           Pertinent Vitals/Pain Pain Assessment: (some leg pain while walking, some back pain from being in bed too much, otherwise ok. Pt does nto rate. )    Home Living Family/patient expects to be discharged to:: Private residence Living Arrangements: Alone Available Help at Discharge: Family Type of Home: House Home Access: Level entry       Home Equipment: None      Prior Function                 Hand Dominance        Extremity/Trunk Assessment                Communication      Cognition Arousal/Alertness: Awake/alert Behavior During Therapy: WFL for tasks assessed/performed(somewhat sad recently being given unfortuante news, frustrated with being in hospital for so long. ) Overall Cognitive Status:  Within Functional Limits for tasks assessed                                        General Comments      Exercises     Assessment/Plan    PT Assessment [place] does not need any further PT services;All further PT needs can be met in the next venue of care  PT Problem List Decreased balance;Decreased activity tolerance       PT Treatment Interventions Functional mobility training;Therapeutic activities;Patient/family education    PT Goals (Current goals can be found in the Care Plan section)  Acute Rehab PT Goals Patient Stated Goal: return to home PT Goal Formulation: All assessment and education complete, DC therapy    Frequency      Barriers to discharge Decreased caregiver support      Co-evaluation               AM-PAC PT "6 Clicks" Daily Activity  Outcome Measure Difficulty turning over in bed (including adjusting bedclothes, sheets and blankets)?: A Little Difficulty moving from lying on back to sitting on the side of the bed? : A Little Difficulty sitting down on and standing up from a chair with arms (e.g., wheelchair, bedside commode, etc,.)?: A Little Help needed moving to and from a bed to chair (including a wheelchair)?: A Little Help needed walking in hospital room?: A Little Help needed climbing 3-5 steps with a railing? : A Little 6 Click Score: 18    End of Session   Activity Tolerance: Patient tolerated treatment well Patient left: in chair;with call bell/phone within reach Nurse Communication: Mobility status PT Visit Diagnosis: Unsteadiness on feet (R26.81);Ataxic gait (R26.0)    Time: 0940-1004 PT Time Calculation (min) (ACUTE ONLY): 24 min   Charges:   PT Evaluation $PT Eval Low Complexity: 1 Low PT Treatments $Therapeutic Activity: 8-22 mins   PT G Codes:        12:16 PM, 16-Jan-2018 Etta Grandchild, PT, DPT Physical Therapist - [place] [phone] (Pager)  [phone] (Office)      [place] C Jan 16, 2018, 12:11 PM

## 2017-12-25 NOTE — Consult Note (Addendum)
Consultation Note Date: 12/25/2017   Patient Name: Nathan Richards  DOB: 11-Feb-1946  MRN: [id number]  Age / Sex: 72 y.o., male  PCP: Horald Pollen, MD Referring Physician: Florencia Reasons, MD  Reason for Consultation: Establishing goals of care  HPI/Patient Profile: 72 y.o. 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 CXR revealed B. Pleural effusions with possible PNA. He was also found to have pancytopenia with Hg 5.5 and worsening A/CKD with SeCr of 5.03. Oncology has consulted and feels like patient is no longer a candidate for chemotherapy or dialysis. Hospice is being recommended. Palliative care was consulted to help address goals.   Clinical Assessment and Goals of Care: I met with patient's ex-wife, son, daughter-in-law, and spoke by phone with patient's daughter in [place]. Patient has two sons and two daughters. Three of children live locally. Son reports that patient called him last night to tell him that he was nearing end of life and would no longer take any treatment for the CLL. I updated family on patient's medical problems and possible clinical trajectory. Patient has requested to go home with son (patient was previously living alone in an apartment). We talked about hospice involvement and family were receptive to this. I will place an order for care management to help coordinate hospice.   I also discussed code status and family stated that patient would not want to be resuscitated.   Patient confirms desire to go home with son and hospice.   Attending updated.   SUMMARY OF RECOMMENDATIONS   1. Care management for hospice referral     Primary Diagnoses: Present on Admission: . Acute respiratory failure with hypoxia ([place]) . Generalized abdominal pain . CLL (chronic lymphocytic  leukemia) ([place]) . CKD (chronic kidney disease) stage 4, GFR 15-29 ml/min (HCC) . Pancytopenia ([place]) . HCAP (healthcare-associated pneumonia)   I have reviewed the medical record, interviewed the patient and family, and examined the patient. The following aspects are pertinent.  Past Medical History:  Diagnosis Date  . Anemia 12/24/2017  . CHF (congestive heart failure) ([place])   . Chronic kidney disease   . CLL (chronic lymphocytic leukemia) ([place])   . Dyspnea   . History of kidney stones   . Hypertension   . Urolithiasis    cystoscopy in 2002   Social History   Socioeconomic History  . Marital status: Married    Spouse name: Not on file  . Number of children: Not on file  . Years of education: Not on file  . Highest education level: Not on file  Occupational History    Employer: UNEMPLOYED  Social Needs  . Financial resource strain: Not on file  . Food insecurity:    Worry: Not on file    Inability: Not on file  . Transportation needs:    Medical: Not on file    Non-medical: Not on file  Tobacco Use  . Smoking status: Former  Smoker    Years: 31.00    Types: Cigarettes    Last attempt to quit: 12/22/1997    Years since quitting: 20.0  . Smokeless tobacco: Never Used  Substance and Sexual Activity  . Alcohol use: No  . Drug use: No    Comment: remote use of MJ and cocaine  . Sexual activity: Never  Lifestyle  . Physical activity:    Days per week: Not on file    Minutes per session: Not on file  . Stress: Not on file  Relationships  . Social connections:    Talks on phone: Not on file    Gets together: Not on file    Attends religious service: Not on file    Active member of club or organization: Not on file    Attends meetings of clubs or organizations: Not on file    Relationship status: Not on file  Other Topics Concern  . Not on file  Social History Narrative   Born [place], lived in [place]. For years before moving to [place]   Married, currently going  through a divorce. Wife is in [place] alone    He has a son living here from a prior relationship   He and his current wife have a daughter and son together   Another son was killed at age 88   2 grand daughters in [place], [place], [place] Abdallah lives locally   Disabled by psychiatric disease.    [employer]   Family History  Problem Relation Age of Onset  . Diabetes Mother   . Coronary artery disease Mother   . Coronary artery disease Father        died  . Diabetes Father   . Diabetes Brother   . Diabetes Brother        died   Scheduled Meds: . amLODipine  10 mg Oral Daily  . enoxaparin (LOVENOX) injection  30 mg Subcutaneous Q24H  . feeding supplement (ENSURE ENLIVE)  237 mL Oral BID BM  . furosemide  80 mg Intravenous BID  . isosorbide-hydrALAZINE  1 tablet Oral TID  . mouth rinse  15 mL Mouth Rinse BID  . metoprolol succinate  100 mg Oral Daily  . senna-docusate  1 tablet Oral BID   Continuous Infusions: . ceFEPime (MAXIPIME) IV 1 g (12/25/17 1100)   PRN Meds:.acetaminophen **OR** acetaminophen, heparin lock flush, heparin lock flush, heparin lock flush, hydrALAZINE, ipratropium-albuterol, LORazepam, ondansetron **OR** ondansetron (ZOFRAN) IV, sodium chloride flush, sodium chloride flush, sodium chloride flush Medications Prior to Admission:  Prior to Admission medications   Medication Sig Start Date End Date Taking? Authorizing Provider  amLODipine (NORVASC) 10 MG tablet Take 10 mg by mouth daily.   Yes [provider]  furosemide (LASIX) 80 MG tablet Take 80 mg by mouth daily.   Yes [provider]  isosorbide-hydrALAZINE (BIDIL) 20-37.5 MG tablet Take 1 tablet by mouth 3 (three) times daily. 07/24/17  Yes Alphonzo Grieve, MD  lidocaine-prilocaine (EMLA) cream Apply 1 application topically as needed. Patient taking differently: Apply 1 application topically as needed (for port).  08/27/17  Yes Ennever, Rudell Cobb, MD    loperamide (IMODIUM) 2 MG capsule Take 1 capsule (2 mg total) by mouth 4 (four) times daily as needed for diarrhea or loose stools. 11/19/17  Yes Plunkett, Loree Fee, MD  LORazepam (ATIVAN) 0.5 MG tablet Take one tablet sublingual 3 times a day x 4 days then every 8  hours as needed for nausea Patient taking differently: Take 0.5 mg by mouth every 8 (eight) hours as needed (nausea).  09/18/17  Yes Volanda Napoleon, MD  metoprolol succinate (TOPROL-XL) 100 MG 24 hr tablet Take 100 mg by mouth daily. 10/07/17  Yes [provider]  ondansetron (ZOFRAN) 4 MG tablet Take one tablet 3 x a day for 4 days then every 8 hours as needed for nausea Patient taking differently: Take 4 mg by mouth every 8 (eight) hours as needed for nausea.  09/17/17  Yes Volanda Napoleon, MD  prochlorperazine (COMPAZINE) 10 MG tablet Take 1 tablet (10 mg total) by mouth every 6 (six) hours as needed for nausea or vomiting. Patient not taking: Reported on 10/15/2017 08/27/17   Volanda Napoleon, MD   Allergies  Allergen Reactions  . Ace Inhibitors     REACTION: cough  . Amlodipine Besylate     REACTION: Impotence  . Beta Adrenergic Blockers     REACTION: Impotence  . Hydrochlorothiazide Other (See Comments)    May have contributed to his hypercalcemia   Review of Systems  Unable to perform ROS   Physical Exam  Constitutional:  Thin, frail appearing  Cardiovascular: Regular rhythm.  tachy  Pulmonary/Chest:  Somewhat labored, poor air movement  Abdominal: Bowel sounds are decreased.  Skin: Skin is warm and dry.    Vital Signs: BP (!) 151/80 (BP Location: Left Arm)   Pulse 84   Temp (!) 101.4 F (38.6 C) (Oral)   Resp (!) 35   Ht '5\' 2"'$  (1.575 m)   Wt 52.7 kg (116 lb 3.2 oz)   SpO2 96%   BMI 21.25 kg/m  Pain Scale: 0-10   Pain Score: 0-No pain   SpO2: SpO2: 96 % O2 Device:SpO2: 96 % O2 Flow Rate: .   IO: Intake/output summary:   Intake/Output Summary (Last 24 hours) at 12/25/2017 1211 Last data  filed at 12/25/2017 1100 Gross per 24 hour  Intake 1176 ml  Output 1250 ml  Net -74 ml    LBM: Last BM Date: 12/25/17 Baseline Weight: Weight: 52.2 kg (115 lb) Most recent weight: Weight: 52.7 kg (116 lb 3.2 oz)     Palliative Assessment/Data:   Flowsheet Rows     Most Recent Value  Intake Tab  Referral Department  Hospitalist  Unit at Time of Referral  Med/Surg Unit  Date Notified  12/23/17  Palliative Care Type  New Palliative care  Reason for referral  Clarify Goals of Care  Date of Admission  12/23/17  Date first seen by Palliative Care  12/24/17  # of days Palliative referral response time  1 Day(s)  # of days IP prior to Palliative referral  0  Clinical Assessment  Palliative Performance Scale Score  30%  Psychosocial & Spiritual Assessment  Palliative Care Outcomes  Patient/Family meeting held?  Yes  Who was at the meeting?  Randall, son, daugter-in-law, daughter by phone      Time In: 1100 Time Out: 1200 Time Total: 60 minutes Greater than 50%  of this time was spent counseling and coordinating care related to the above assessment and plan.  Signed by: Irean Hong, NP   Please contact Palliative Medicine Team phone at [phone] for questions and concerns.  For individual provider: See Shea Evans

## 2017-12-25 NOTE — Progress Notes (Signed)
Initial Nutrition Assessment  DOCUMENTATION CODES:   Not applicable  INTERVENTION:   Liberalized diet to Regular for enjoyment and to promote PO intake   Increase Ensure Enlive po TID, each supplement provides 350 kcal and 20 grams of protein  NUTRITION DIAGNOSIS:   Increased nutrient needs related to chronic illness, cancer and cancer related treatments as evidenced by estimated needs  GOAL:   Patient will meet greater than or equal to 90% of their needs  MONITOR:   PO intake, Supplement acceptance, Labs, I & O's, Weight trends  REASON FOR ASSESSMENT:   Malnutrition Screening Tool    ASSESSMENT:   Pt with PMH of CLL (last chemotheapy treatment 12/10/17), CKD r/t CLL, CHF, and HTN presents with SOB, chest pain and cough found acute respiratory failure with hypoxia   Per Oncology note, believes pt is no longer candidate for chemotherapy or dialysis. Pt to be sent home with hospice care and has a poor prognosis.   Pt has been eating poorly r/t food choices and taste. Contacted MD, pt's diet liberalized a 1454. Pt has been supplementing poor intake with Ensure and states he really enjoys it. Pt interested in receiving Ensure TID between meals to promote intake.    Pt reports he is starting to gain some weight back. Pt with severe weight loss history in chart. However weight at last RD visit was 113 lb (06/2017), pt currently 116 lbs.   Labs reviewed; Na 132, BUN 51, Creatinine 5.03, Hemoglobin 8.2, HCT 24.3 Medications reviewed; Senokot-S, sodium bicarbonate  NUTRITION - FOCUSED PHYSICAL EXAM:  Not appropriate at this time   Diet Order:   Diet Order           Diet regular Room service appropriate? Yes; Fluid consistency: Thin  Diet effective now         EDUCATION NEEDS:   Not appropriate for education at this time  Skin:  Skin Assessment: Reviewed RN Assessment  Last BM:  12/25/17  Height:   Ht Readings from Last 1 Encounters:  12/23/17 $RemoveB'5\' 2"'YJxjFyUH$  (1.575 m)     Weight:   Wt Readings from Last 1 Encounters:  12/25/17 116 lb 3.2 oz (52.7 kg)    Ideal Body Weight:  50 kg  BMI:  Body mass index is 21.25 kg/m.  Estimated Nutritional Needs:   Kcal:  1450-1650  Protein:  70-80 grams  Fluid:  >/= 1.5 L/d  Marjo Bicker, MS, RDN, LDN 12/25/2017 3:20 PM

## 2017-12-25 NOTE — Progress Notes (Signed)
  Echocardiogram 2D Echocardiogram has been performed.  Merrie Roof F 12/25/2017, 12:32 PM

## 2017-12-25 NOTE — Progress Notes (Addendum)
[facility] Liaison:  Notified by Jack[facility] Pol[facility] of family request for Hospice and [place] services at home after discharge. Chart and patient information reviewed and hospice eligibility confirmed by Dr. Alferd Patee, hospice medical director.   Spoke with Henderson Baltimore, daughter in law to initiate education related to hospice philosophy, services and team approach to care. Family verbalized understanding of the information provided. Per discussion, plan is for discharge to home by [facility] tomorrow.   Please send signed completed DNR form home with patient. Patient will need prescriptions for discharge comfort medications.   DME needs discussed and family requested hospital bed, OBT and oxygen for delivery to the home today. [facility] equipment manager Jewel Ysidro Evert notified and will contact [facility] to arrange delivery to the home. The home address has been verified and is correct in the chart; Henderson Baltimore is to be contacted to arrange time of delivery.   [facility] aware of the above. Completed discharge summary will need to be faxed to [facility] at [phone] when final. Please notify [facility] when patient is ready to leave unit at discharge-call [phone].   [facility] information and contact numbers have been given to patient during visit.  Please call with any questions.   Thank You, Freddi Starr RN, BSN [facility] Liaison [phone]  [facility] hospital liaisons now live on [address].

## 2017-12-25 NOTE — Progress Notes (Signed)
@  0007 notified provide on-call ([place]) of pt's PO temp of 102.2. Previously ordered APAP administered but no new orders received. Will reassess in approx. 1 hr.

## 2017-12-25 NOTE — Progress Notes (Signed)
Preliminary note--Bilateral lower extremities venous duplex exam completed. Negative for DVT. Edema noted bilaterally.    Hongying Angeles Paolucci (RDMS RVT) 12/25/17 12:11 PM

## 2017-12-25 NOTE — Progress Notes (Addendum)
PROGRESS NOTE  Nathan Richards DJS:[id number] DOB: 1946/08/25 DOA: 12/23/2017 PCP: Horald Pollen, MD  HPI/Recap of past 24 hours:  tmax 101.4 this am  He reports feeling tired after talking to many people and had echo done. Persistent lower extremity edema, some intermittent dry cough noticed during encounter   He denies  ab pain or chest pain , he reports he has chronic loose stool He report chronic back pain  Family at bedside  Assessment/Plan: Principal Problem:   Acute respiratory failure with hypoxia ([place]) Active Problems:   CKD (chronic kidney disease) stage 4, GFR 15-29 ml/min (HCC)   CLL (chronic lymphocytic leukemia) (HCC)   Chronic congestive heart failure (HCC)   Generalized abdominal pain   Pancytopenia (HCC)   HCAP (healthcare-associated pneumonia)   Palliative care encounter  Acute Hypoxic respiratory failure, likely from chf exacerbation, severe anemia, pna with fever -he reports /doe/orthopeniax2weeks - he has Bilateral lower extremity pitting edema , venous doppler negative for DVT, will d/c norvasc in the setting of edema -v/Q scan low probability for PE -he received increase lasix to $Remove'80mg'fsxFjeu$  bid, change  Back to home dose lasix $RemoveBe'80mg'yNdTVbUHx$  daily today, continue abx, culture in process -echocardiogram "The estimated ejection fraction  was in the range of 35% to 40%. Diffuse hypokinesis."  Fever: from pna vs from cll monitor  HTN; continue bidil,lopressor, lasix D/c norvasc due to edema  Acute on chronic anemia: -last chemo on 4/18, no overt sign of bleeding -hgb improved from 5.5 to 8.6 after prbc x3untis Hematology oncology Dr Marin Olp ordered aranespx1 subQ on 5/3.  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 Patient states last chemo " almost killed me, I am not going to get more chemotherapy" - Dr Marin Olp consulted who recommend palliative care/hospice, input appreciated - palliative care  consulted. - I have also talked to family including son, daughter, exwife in room and another daughter ( who is in [place]) over the phone -patient is now DNR, arranging home hospice, home equipment, likely d/c in am if all set up. D/c tele.  Metabolic acidosis: from renal failure Start bicarb supplement.  FTT: malnutrition Home with home hospice hopefully tomorrow  Code Status: DNR  Family Communication: patient and family  Disposition Plan: home with home hospice   Consultants:  case discussed with oncology Dr Marin Olp  Case discussed with Palliative care   Case discussed with case manager  Procedures:  prbc transfusion x3  Antibiotics:  As above   Objective: BP 137/77 (BP Location: Left Arm)   Pulse 84   Temp 99.1 F (37.3 C) (Oral)   Resp (!) 35   Ht $R'5\' 2"'qK$  (1.575 m)   Wt 52.7 kg (116 lb 3.2 oz)   SpO2 96%   BMI 21.25 kg/m   Intake/Output Summary (Last 24 hours) at 12/25/2017 1559 Last data filed at 12/25/2017 1100 Gross per 24 hour  Intake 610 ml  Output 1250 ml  Net -640 ml   Filed Weights   12/23/17 2155 12/25/17 0450  Weight: 52.2 kg (115 lb) 52.7 kg (116 lb 3.2 oz)    Exam: Patient is examined daily including today on 12/25/2017, exams remain the same as of yesterday except that has changed    General:  Thin, chronically ill, dyspnea with minimal exertion  Cardiovascular: RRR  Respiratory: diminished at basis, no wheezing  Abdomen: Soft/ND/NT, positive BS  Musculoskeletal: bilateral lower extremity pitting  Edema  Neuro: alert, oriented  Data Reviewed: Basic Metabolic Panel: Recent Labs  Lab 12/23/17 2200 12/25/17 0413  NA 135 132*  K 3.7 3.5  CL 106 102  CO2 18* 18*  GLUCOSE 111* 110*  BUN 48* 51*  CREATININE 4.79* 5.03*  CALCIUM 8.1* 8.6*   Liver Function Tests: Recent Labs  Lab 12/23/17 2200  AST 27  ALT 24  ALKPHOS 76  BILITOT 0.5  PROT 6.1*  ALBUMIN 2.9*   Recent Labs  Lab 12/23/17 2200  LIPASE 39   No  results for input(s): AMMONIA in the last 168 hours. CBC: Recent Labs  Lab 12/23/17 2200 12/24/17 1421 12/25/17 0413  WBC 2.4* 2.3* 1.5*  NEUTROABS  --  0.6*  --   HGB 5.5* 8.6* 8.2*  HCT 16.0* 25.3* 24.3*  MCV 83.8 83.2 82.9  PLT 141* 124* 115*   Cardiac Enzymes:   Recent Labs  Lab 12/24/17 0105 12/24/17 1643 12/24/17 2028 12/25/17 0413  TROPONINI 0.15* 0.14* 0.15* 0.15*   BNP (last 3 results) Recent Labs    01/27/17 1054 07/22/17 1453 07/22/17 1539  BNP 579.0* 3,658.8* 3,324.5*    ProBNP (last 3 results) No results for input(s): PROBNP in the last 8760 hours.  CBG: No results for input(s): GLUCAP in the last 168 hours.  Recent Results (from the past 240 hour(s))  Culture, sputum-assessment     Status: None   Collection Time: 12/24/17  8:22 AM  Result Value Ref Range Status   Specimen Description SPUTUM  Final   Special Requests NONE  Final   Sputum evaluation   Final    THIS SPECIMEN IS ACCEPTABLE FOR SPUTUM CULTURE Performed at [facility], [address] [place], [place] [address]    Report Status 12/24/2017 FINAL  Final  Culture, respiratory (NON-Expectorated)     Status: None (Preliminary result)   Collection Time: 12/24/17  8:22 AM  Result Value Ref Range Status   Specimen Description SPUTUM  Final   Special Requests NONE Reflexed from [id number]  Final   Gram Stain   Final    FEW WBC PRESENT, PREDOMINANTLY PMN MODERATE GRAM POSITIVE COCCI IN PAIRS IN CLUSTERS FEW GRAM NEGATIVE RODS FEW GRAM POSITIVE RODS    Culture   Final    CULTURE REINCUBATED FOR BETTER GROWTH Performed at [facility], [address], [place], [place] [address]    Report Status PENDING  Incomplete  Blood culture (routine x 2)     Status: None (Preliminary result)   Collection Time: 12/24/17  2:25 PM  Result Value Ref Range Status   Specimen Description BLOOD RIGHT ARM  Final   Special Requests   Final    BOTTLES DRAWN AEROBIC AND ANAEROBIC Blood Culture  adequate volume   Culture   Final    NO GROWTH < 24 HOURS Performed at [facility], [address], [place], [place] [address]    Report Status PENDING  Incomplete  Blood culture (routine x 2)     Status: None (Preliminary result)   Collection Time: 12/24/17  4:40 PM  Result Value Ref Range Status   Specimen Description BLOOD LEFT ANTECUBITAL  Final   Special Requests   Final    BOTTLES DRAWN AEROBIC ONLY Blood Culture results may not be optimal due to an inadequate volume of blood received in culture bottles   Culture   Final    NO GROWTH < 24 HOURS Performed at [facility], [address], [place], [place] [address]  Report Status PENDING  Incomplete     Studies: Nm Pulmonary Perf And Vent  Result Date: 12/24/2017 CLINICAL DATA:  Shortness of breath with cough and abdominal pain. EXAM: NUCLEAR MEDICINE VENTILATION - PERFUSION LUNG SCAN TECHNIQUE: Ventilation images were obtained in multiple projections using inhaled aerosol Tc-18m DTPA. Perfusion images were obtained in multiple projections after intravenous injection of Tc-74m-MAA. RADIOPHARMACEUTICALS:  32 mCi of Tc-57m DTPA aerosol inhalation and 4.2 mCi Tc64m-MAA IV COMPARISON:  Chest x-ray from earlier the same day FINDINGS: Ventilation: Assessment limited by central aggregation of in lesional agent. Wedge-shaped peripheral ventilation defects identified at the left lung base, best seen on posterior and LPO projections. Perfusion: Photopenic defect over the right lung compatible with the port device. Matched perfusion defects identified at the left lung base. No unmatched perfusion defect to suggest the presence of an acute pulmonary embolus. IMPRESSION: Low probability for pulmonary embolus.  The Electronically Signed   By: Misty Stanley M.D.   On: 12/24/2017 16:34    Scheduled Meds: . enoxaparin (LOVENOX) injection  30 mg Subcutaneous Q24H  . feeding supplement (ENSURE ENLIVE)  237 mL Oral TID BM  . [START ON  12/26/2017] furosemide  80 mg Oral Daily  . isosorbide-hydrALAZINE  1 tablet Oral TID  . mouth rinse  15 mL Mouth Rinse BID  . metoprolol succinate  100 mg Oral Daily  . senna-docusate  1 tablet Oral BID  . sodium bicarbonate  650 mg Oral BID    Continuous Infusions: . ceFEPime (MAXIPIME) IV Stopped (12/25/17 1140)     Time spent: 35 mins, more than 50% time spent on coordination of care I have personally reviewed and interpreted on  12/25/2017 daily labs, tele strips, imagings as discussed above under date review session and assessment and plans.  I reviewed all nursing notes, consult note, vitals, pertinent old records  I have discussed plan of care as described above with RN , patient  And family on 12/25/2017   Florencia Reasons MD, PhD  [facility] Pager [phone]. If 7PM-7AM, please contact night-coverage at [email or web address], password [facility] 12/25/2017, 3:59 PM  LOS: 1 day

## 2017-12-26 LAB — CULTURE, RESPIRATORY

## 2017-12-26 LAB — CULTURE, RESPIRATORY W GRAM STAIN: Culture: NORMAL

## 2017-12-26 LAB — BASIC METABOLIC PANEL
ANION GAP: 12 (ref 5–15)
BUN: 56 mg/dL — AB (ref 6–20)
CALCIUM: 8 mg/dL — AB (ref 8.9–10.3)
CHLORIDE: 100 mmol/L — AB (ref 101–111)
CO2: 21 mmol/L — AB (ref 22–32)
CREATININE: 5.02 mg/dL — AB (ref 0.61–1.24)
GFR calc non Af Amer: 11 mL/min — ABNORMAL LOW (ref >60)
GFR, EST AFRICAN AMERICAN: 12 mL/min — AB (ref >60)
Glucose, Bld: 148 mg/dL — ABNORMAL HIGH (ref 65–99)
Potassium: 3.7 mmol/L (ref 3.5–5.1)
SODIUM: 133 mmol/L — AB (ref 135–145)

## 2017-12-26 LAB — VANCOMYCIN, RANDOM: VANCOMYCIN RM: 10

## 2017-12-26 MED ORDER — SODIUM BICARBONATE 650 MG PO TABS: 650.0000 mg | ORAL_TABLET | Freq: Two times a day (BID) | ORAL | 0 refills | Status: DC

## 2017-12-26 MED ORDER — AMOXICILLIN-POT CLAVULANATE 500-125 MG PO TABS
500.0000 mg | ORAL_TABLET | Freq: Two times a day (BID) | ORAL | Status: DC
  Administered 2017-12-26: 500 mg via ORAL
  Filled 2017-12-26: qty 1

## 2017-12-26 MED ORDER — SENNOSIDES-DOCUSATE SODIUM 8.6-50 MG PO TABS: 1.0000 | ORAL_TABLET | Freq: Every day | ORAL | 0 refills | Status: DC

## 2017-12-26 MED ORDER — AMOXICILLIN-POT CLAVULANATE 500-125 MG PO TABS: 500.0000 mg | ORAL_TABLET | Freq: Two times a day (BID) | ORAL | 0 refills | Status: AC

## 2017-12-26 MED ORDER — ACETAMINOPHEN 325 MG PO TABS: 650.0000 mg | ORAL_TABLET | Freq: Four times a day (QID) | ORAL | 0 refills | Status: DC | PRN

## 2017-12-26 MED ORDER — VANCOMYCIN HCL 500 MG IV SOLR
500.0000 mg | Freq: Once | INTRAVENOUS | Status: DC
  Filled 2017-12-26 (×2): qty 500

## 2017-12-26 MED ORDER — ENSURE ENLIVE PO LIQD: 237.0000 mL | Freq: Three times a day (TID) | ORAL | 12 refills | Status: DC

## 2017-12-26 NOTE — Progress Notes (Addendum)
Patient if for discharge home today with Hospice Care; TCT Corrina (daug in law) as requested, DME to be delivered to the home today between the hours of 11 am - 3 pm; Corrina is to call the CM after the DME arrives; [place] RN,MHA,BSN [phone]  2;29 pm - CM talked to [place], the DME has been delivered to the home and they want to take the patient home via private vehicle; Bedside nurse updated, [facility] made aware of discharge home today; Aneta Mins [phone]

## 2017-12-26 NOTE — Progress Notes (Signed)
Pharmacy Antibiotic Note  Nathan Richards is a 72 y.o. male admitted on 12/23/2017 with pneumonia.  Pharmacy has been consulted for vancomycin dosing. He has CKD and is not on HD.  Random vanc level this am 10 mcg/ml  Plan: Vancomycin 500 mg IV x 1 F/u random level in 3-5 days for redose  Height: $Remove'5\' 2"'UPTcNcx$  (157.5 cm) Weight: 116 lb 3.2 oz (52.7 kg) IBW/kg (Calculated) : 54.6  Temp (24hrs), Avg:100.2 F (37.9 C), Min:99.1 F (37.3 C), Max:101.4 F (38.6 C)  Recent Labs  Lab 12/23/17 2200 12/24/17 1421 12/25/17 0413 12/26/17 0355  WBC 2.4* 2.3* 1.5*  --   CREATININE 4.79*  --  5.03* 5.02*  VANCORANDOM  --   --   --  10    Estimated Creatinine Clearance: 10.4 mL/min (A) (by C-G formula based on SCr of 5.02 mg/dL (H)).    Allergies  Allergen Reactions  . Ace Inhibitors     REACTION: cough  . Amlodipine Besylate     REACTION: Impotence  . Beta Adrenergic Blockers     REACTION: Impotence  . Hydrochlorothiazide Other (See Comments)    May have contributed to his hypercalcemia     Thank you for allowing pharmacy to be a part of this patient's care.  Caileigh Canche, [place] 12/26/2017 5:12 AM

## 2017-12-26 NOTE — Discharge Summary (Addendum)
Discharge Summary  Dimitris Shanahan GNF:[id number] DOB: [birth year]  PCP: Horald Pollen, MD  Admit date: 12/23/2017 Discharge date: 12/26/2017  Time spent: 46mins, more than 50% time spent on coordination of care  Recommendations for Outpatient Follow-up:  1. F/u with home hospice 2. F/u with oncology Dr Marin Olp as needed 3. F/u with pcp as needed  Discharge Diagnoses:  Active Hospital Problems   Diagnosis Date Noted  . Acute respiratory failure with hypoxia ([place]) 12/24/2017  . Palliative care encounter   . Pancytopenia ([place]) 12/24/2017  . HCAP (healthcare-associated pneumonia) 12/24/2017  . Generalized abdominal pain 09/22/2017  . Chronic congestive heart failure ([place]) 07/29/2017  . CLL (chronic lymphocytic leukemia) ([place]) 01/28/2017  . CKD (chronic kidney disease) stage 4, GFR 15-29 ml/min (HCC) 02/11/2010    Resolved Hospital Problems  No resolved problems to display.    Discharge Condition: stable  Diet recommendation: regular diet  Filed Weights   12/23/17 2155 12/25/17 0450  Weight: 52.2 kg (115 lb) 52.7 kg (116 lb 3.2 oz)    History of present illness: (per Admitting MD Dr Hal Hope) PCP: Horald Pollen, MD  Patient coming from: Home.  Chief Complaint: Shortness of breath abdominal pain.  Chest pain.  HPI: Nathan Richards is a 72 y.o. male with history of CLL, chronic kidney disease related to CLL, chronic systolic and diastolic heart failure, hypertension presents to the ER with complaints of shortness of breath chest pain cough and abdominal pain.  Patient states he had chemotherapy on December 10, 2017.  Since last 1 week patient has been having abdominal pain initially with diarrhea which improved with loperamide.  He has benign abdominal pain in the lower quadrants with some nausea.  No occasional vomiting.  Patient also been having productive cough and chest pain on coughing.  Also has been getting progressively short of breath.  ED Course: In the ER  patient appeared short of breath.  Chest x-ray was done followed by CT abdomen and pelvis.  Shows bilateral pleural effusion and possible pneumonia.  Patient's hemoglobin has significantly dropped with pancytopenia.  Hemoglobin is around 5.5 stool for occult blood was negative.  3 units of packed red blood cell transfusion has been ordered.  Patient admitted for acute respiratory failure likely from CHF and pneumonia and severe anemia.  Abdominal pain cause not clear.  Could be from constipation.    Hospital Course:  Principal Problem:   Acute respiratory failure with hypoxia (HCC) Active Problems:   CKD (chronic kidney disease) stage 4, GFR 15-29 ml/min (HCC)   CLL (chronic lymphocytic leukemia) (HCC)   Chronic congestive heart failure (HCC)   Generalized abdominal pain   Pancytopenia (HCC)   HCAP (healthcare-associated pneumonia)   Palliative care encounter   Acute Hypoxic respiratory failure, likely from chf exacerbation, severe anemia, pna with fever -he reports /doe/orthopeniax2weeks - he has Bilateral lower extremity pitting edema , venous doppler negative for DVT, will d/c norvasc in the setting of edema -v/Q scan low probability for PE -he received increase lasix to $Remove'80mg'WPXiexK$  bid, change  Back to home dose lasix $RemoveBe'80mg'ongcDSvjZ$  daily today, he received vanc and cefepime in the hospital, mrsa screening negative, blood culture no growth, sputum culture growth orophoryngeal flora, he is discharged on augmentin to finish abx treatment. -echocardiogram "The estimated ejection fraction was in the range of 35% to 40%. Diffuse hypokinesis."  Fever: from pna vs from CLL monitor  HTN; continue bidil,lopressor, lasix D/c norvasc due to edema  Acute on chronic anemia: -last chemo  on 4/18, no overt sign of bleeding -hgb improved from 5.5 to 8.6 after prbc x3untis Hematology oncology Dr Marin Olp ordered aranespx1 subQ on 5/3.  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 Patient states last chemo " almost killed me, I am not going to get more chemotherapy" - oncology Dr Marin Olp has discussed code status with patient, patient is now DNR,  - palliative care consulted. Palliative care discussed with patient and family, confirmed DNR and patient want to go home with home hospice. - I have also talked to family including son, daughter, exwife in room and another daughter ( who is in [place]) over the phone -to home with home hospice, home equipment.   Metabolic acidosis: from renal failure Start bicarb supplement.  FTT: Moderate  malnutrition associated with cancer, he has significant reduced functional capacity, muscle wasting and reduced subcutaneous fat, though his weight has been fairly stable around 110's since 01/2017 per chart review.  Home with home hospice   Code Status: DNR  Family Communication: patient and family  Disposition Plan: home with home hospice   Consultants:  case discussed with oncology Dr Marin Olp  Case discussed with Palliative care   Case discussed with case manager  Procedures:  prbc transfusion x3  Antibiotics:  As above   Discharge Exam: BP 121/65 (BP Location: Left Arm)   Pulse 80   Temp 99.1 F (37.3 C) (Oral)   Resp 18   Ht $R'5\' 2"'xk$  (1.575 m)   Wt 52.7 kg (116 lb 3.2 oz)   SpO2 94%   BMI 21.25 kg/m      General:  Thin, chronically ill, dyspnea with minimal exertion  Cardiovascular: RRR  Respiratory: diminished at basis, no wheezing  Abdomen: Soft/ND/NT, positive BS  Musculoskeletal: less bilateral lower extremity pitting  Edema  Neuro: alert, oriented    Discharge Instructions You were cared for by a hospitalist during your hospital stay. If you have any questions about your discharge medications or the care you received while you were in the hospital after you are discharged, you can call the unit and asked to speak with the hospitalist on call if the  hospitalist that took care of you is not available. Once you are discharged, your primary care physician will handle any further medical issues. Please note that NO REFILLS for any discharge medications will be authorized once you are discharged, as it is imperative that you return to your primary care physician (or establish a relationship with a primary care physician if you do not have one) for your aftercare needs so that they can reassess your need for medications and monitor your lab values.  Discharge Instructions    DME Hospital bed   Complete by:  As directed    The above medical condition requires:  Patient requires the ability to reposition frequently   Head must be elevated greater than:  30 degrees   Bed type:  Semi-electric   Diet general   Complete by:  As directed    Increase activity slowly   Complete by:  As directed      Allergies as of 12/26/2017      Reactions   Ace Inhibitors    REACTION: cough   Amlodipine Besylate    REACTION: Impotence   Beta Adrenergic Blockers    REACTION: Impotence   Hydrochlorothiazide Other (See Comments)   May have contributed to his hypercalcemia      Medication List  STOP taking these medications   amLODipine 10 MG tablet Commonly known as:  NORVASC   loperamide 2 MG capsule Commonly known as:  IMODIUM   prochlorperazine 10 MG tablet Commonly known as:  COMPAZINE     TAKE these medications   acetaminophen 325 MG tablet Commonly known as:  TYLENOL Take 2 tablets (650 mg total) by mouth every 6 (six) hours as needed for mild pain, fever or headache (or Fever >/= 101).   amoxicillin-clavulanate 500-125 MG tablet Commonly known as:  AUGMENTIN Take 1 tablet (500 mg total) by mouth 2 (two) times daily for 5 days.   feeding supplement (ENSURE ENLIVE) Liqd Take 237 mLs by mouth 3 (three) times daily between meals.   furosemide 80 MG tablet Commonly known as:  LASIX Take 80 mg by mouth daily.   isosorbide-hydrALAZINE  20-37.5 MG tablet Commonly known as:  BIDIL Take 1 tablet by mouth 3 (three) times daily.   lidocaine-prilocaine cream Commonly known as:  EMLA Apply 1 application topically as needed. What changed:  reasons to take this   LORazepam 0.5 MG tablet Commonly known as:  ATIVAN Take one tablet sublingual 3 times a day x 4 days then every 8 hours as needed for nausea What changed:    how much to take  how to take this  when to take this  reasons to take this  additional instructions   metoprolol succinate 100 MG 24 hr tablet Commonly known as:  TOPROL-XL Take 100 mg by mouth daily.   ondansetron 4 MG tablet Commonly known as:  ZOFRAN Take one tablet 3 x a day for 4 days then every 8 hours as needed for nausea What changed:    how much to take  how to take this  when to take this  reasons to take this  additional instructions   senna-docusate 8.6-50 MG tablet Commonly known as:  Senokot-S Take 1 tablet by mouth at bedtime.   sodium bicarbonate 650 MG tablet Take 1 tablet (650 mg total) by mouth 2 (two) times daily.            Durable Medical Equipment  (From admission, onward)        Start     Ordered   12/26/17 0857  DME Oxygen  Once    Question Answer Comment  Mode or (Route) Nasal cannula   Liters per Minute 2   Frequency Continuous (stationary and portable oxygen unit needed)   Oxygen conserving device Yes   Oxygen delivery system Gas      12/26/17 0856   12/26/17 0000  DME Hospital bed    Question Answer Comment  The above medical condition requires: Patient requires the ability to reposition frequently   Head must be elevated greater than: 30 degrees   Bed type Semi-electric      12/26/17 0859     Allergies  Allergen Reactions  . Ace Inhibitors     REACTION: cough  . Amlodipine Besylate     REACTION: Impotence  . Beta Adrenergic Blockers     REACTION: Impotence  . Hydrochlorothiazide Other (See Comments)    May have contributed to  his hypercalcemia   Follow-up Information    Volanda Napoleon, MD Follow up.   Specialty:  Oncology Why:  as needed Contact information: [place] [address] [phone]        home hospice Follow up.        Horald Pollen, MD Follow up.  Specialty:  Internal Medicine Why:  as needed Contact information: [place] [address] [phone]            The results of significant diagnostics from this hospitalization (including imaging, microbiology, ancillary and laboratory) are listed below for reference.    Significant Diagnostic Studies: Ct Abdomen Pelvis Wo Contrast  Result Date: 12/24/2017 CLINICAL DATA:  Patient on chemotherapy, last dose received on 11/30/2017. Patient has had abdominal pain since with pain radiating into the chest. Patient also had diarrhea since chemotherapy began. EXAM: CT ABDOMEN AND PELVIS WITHOUT CONTRAST TECHNIQUE: Multidetector CT imaging of the abdomen and pelvis was performed following the standard protocol without IV contrast. COMPARISON:  06/20/2017 FINDINGS: Lower chest: Cardiomegaly with trace pericardial effusion unchanged. Trace bilateral pleural effusions with patchy airspace opacities in the right middle lobe and dependent aspect of both lower lobes, right greater than left. Pneumonia and/or atelectasis may account for this appearance. Hepatobiliary: The unenhanced liver is unremarkable. The gallbladder is free of stones. Pancreas: No pancreatic ductal dilatation or inflammation. Spleen: Borderline splenomegaly with the spleen measuring approximately 15.1 x 4 x 11.9 cm (volume = 400 cm^3) Adrenals/Urinary Tract: No adrenal mass. No obstructive uropathy. The unenhanced kidneys are unremarkable. The urinary bladder is decompressed. Stomach/Bowel: Physiologic distention of the stomach. Normal small bowel rotation. Increased colonic stool burden without bowel obstruction. There is sigmoid  diverticulosis without acute diverticulitis. Normal-appearing appendix. Vascular/Lymphatic: Mild aortoiliac atherosclerosis.  No adenopathy. Reproductive: Prostate is within normal limits for size. Central zone calcifications. Other: Fat containing left inguinal hernia. Musculoskeletal: Degenerative disc disease L4-5. No aggressive osseous lesions. IMPRESSION: 1. Scattered colonic diverticulosis without acute diverticulitis. Increased colonic stool retention consistent with constipation. 2. Cardiomegaly with trace pericardial effusion unchanged. Trace bilateral pleural effusions at each lung base. 3. Patchy right middle lobe and bilateral lower lobe airspace opacities that may reflect pneumonia and/or atelectasis. Electronically Signed   By: Ashley Royalty M.D.   On: 12/24/2017 03:54   Dg Chest 2 View  Result Date: 12/24/2017 CLINICAL DATA:  Chest pain and shortness of breath.  Cough. EXAM: CHEST - 2 VIEW COMPARISON:  Chest radiograph 07/22/2017 FINDINGS: Right chest wall Port-A-Cath tip at the cavoatrial junction. Mild cardiomegaly. Pulmonary vascular congestion without overt pulmonary edema. No focal airspace consolidation, pleural effusion or pneumothorax. IMPRESSION: Cardiomegaly and pulmonary vascular congestion without overt edema. Electronically Signed   By: Ulyses Jarred M.D.   On: 12/24/2017 01:43   Nm Pulmonary Perf And Vent  Result Date: 12/24/2017 CLINICAL DATA:  Shortness of breath with cough and abdominal pain. EXAM: NUCLEAR MEDICINE VENTILATION - PERFUSION LUNG SCAN TECHNIQUE: Ventilation images were obtained in multiple projections using inhaled aerosol Tc-16m DTPA. Perfusion images were obtained in multiple projections after intravenous injection of Tc-49m-MAA. RADIOPHARMACEUTICALS:  32 mCi of Tc-88m DTPA aerosol inhalation and 4.2 mCi Tc27m-MAA IV COMPARISON:  Chest x-ray from earlier the same day FINDINGS: Ventilation: Assessment limited by central aggregation of in lesional agent. Wedge-shaped  peripheral ventilation defects identified at the left lung base, best seen on posterior and LPO projections. Perfusion: Photopenic defect over the right lung compatible with the port device. Matched perfusion defects identified at the left lung base. No unmatched perfusion defect to suggest the presence of an acute pulmonary embolus. IMPRESSION: Low probability for pulmonary embolus.  The Electronically Signed   By: Misty Stanley M.D.   On: 12/24/2017 16:34    Microbiology: Recent Results (from the past 240 hour(s))  Culture, sputum-assessment     Status:  None   Collection Time: 12/24/17  8:22 AM  Result Value Ref Range Status   Specimen Description SPUTUM  Final   Special Requests NONE  Final   Sputum evaluation   Final    THIS SPECIMEN IS ACCEPTABLE FOR SPUTUM CULTURE Performed at [facility], [address], [place], [place] [address]    Report Status 12/24/2017 FINAL  Final  Culture, respiratory (NON-Expectorated)     Status: None (Preliminary result)   Collection Time: 12/24/17  8:22 AM  Result Value Ref Range Status   Specimen Description SPUTUM  Final   Special Requests NONE Reflexed from [id number]  Final   Gram Stain   Final    FEW WBC PRESENT, PREDOMINANTLY PMN MODERATE GRAM POSITIVE COCCI IN PAIRS IN CLUSTERS FEW GRAM NEGATIVE RODS FEW GRAM POSITIVE RODS    Culture   Final    CULTURE REINCUBATED FOR BETTER GROWTH Performed at [facility], [place] [address] [place], [place] [address]    Report Status PENDING  Incomplete  Blood culture (routine x 2)     Status: None (Preliminary result)   Collection Time: 12/24/17  2:25 PM  Result Value Ref Range Status   Specimen Description BLOOD RIGHT ARM  Final   Special Requests   Final    BOTTLES DRAWN AEROBIC AND ANAEROBIC Blood Culture adequate volume   Culture   Final    NO GROWTH < 24 HOURS Performed at [facility], [place] [address] [place], [place] [address]    Report Status PENDING  Incomplete  MRSA PCR  Screening     Status: None   Collection Time: 12/24/17  3:30 PM  Result Value Ref Range Status   MRSA by PCR NEGATIVE NEGATIVE Final    Comment:        The GeneXpert MRSA Assay (FDA approved for NASAL specimens only), is one component of a comprehensive MRSA colonization surveillance program. It is not intended to diagnose MRSA infection nor to guide or monitor treatment for MRSA infections. Performed at [facility], [place] [address], [place], [place] [address]   Blood culture (routine x 2)     Status: None (Preliminary result)   Collection Time: 12/24/17  4:40 PM  Result Value Ref Range Status   Specimen Description BLOOD LEFT ANTECUBITAL  Final   Special Requests   Final    BOTTLES DRAWN AEROBIC ONLY Blood Culture results may not be optimal due to an inadequate volume of blood received in culture bottles   Culture   Final    NO GROWTH < 24 HOURS Performed at [facility], [place] [address], [place], [place] [address]    Report Status PENDING  Incomplete     Labs: Basic Metabolic Panel: Recent Labs  Lab 12/23/17 2200 12/25/17 0413 12/26/17 0355  NA 135 132* 133*  K 3.7 3.5 3.7  CL 106 102 100*  CO2 18* 18* 21*  GLUCOSE 111* 110* 148*  BUN 48* 51* 56*  CREATININE 4.79* 5.03* 5.02*  CALCIUM 8.1* 8.6* 8.0*   Liver Function Tests: Recent Labs  Lab 12/23/17 2200  AST 27  ALT 24  ALKPHOS 76  BILITOT 0.5  PROT 6.1*  ALBUMIN 2.9*   Recent Labs  Lab 12/23/17 2200  LIPASE 39   No results for input(s): AMMONIA in the last 168 hours. CBC: Recent Labs  Lab 12/23/17 2200 12/24/17 1421 12/25/17 0413  WBC 2.4* 2.3* 1.5*  NEUTROABS  --  0.6*  --  HGB 5.5* 8.6* 8.2*  HCT 16.0* 25.3* 24.3*  MCV 83.8 83.2 82.9  PLT 141* 124* 115*   Cardiac Enzymes: Recent Labs  Lab 12/24/17 0105 12/24/17 1643 12/24/17 2028 12/25/17 0413  TROPONINI 0.15* 0.14* 0.15* 0.15*   BNP: BNP (last 3 results) Recent Labs    01/27/17 1054 07/22/17 1453 07/22/17 1539   BNP 579.0* 3,658.8* 3,324.5*    ProBNP (last 3 results) No results for input(s): PROBNP in the last 8760 hours.  CBG: No results for input(s): GLUCAP in the last 168 hours.     Signed:  Florencia Reasons MD, PhD  [facility] 12/26/2017, 9:14 AM

## 2017-12-26 NOTE — Progress Notes (Signed)
Patient discharged to home.  Vital signs stable, IV removed, and pt needs met.  AVS reviewed with patient verbalizing understanding. Olga Coaster, RN case Management notified [facility] of patient's discharge and discharge summary was faxed to [facility] per note by Freddi Starr (hospice).

## 2017-12-27 DIAGNOSIS — I509 Heart failure, unspecified: Secondary | ICD-10-CM | POA: Diagnosis not present

## 2017-12-27 DIAGNOSIS — N185 Chronic kidney disease, stage 5: Secondary | ICD-10-CM | POA: Diagnosis not present

## 2017-12-27 DIAGNOSIS — E46 Unspecified protein-calorie malnutrition: Secondary | ICD-10-CM | POA: Diagnosis not present

## 2017-12-27 DIAGNOSIS — D61818 Other pancytopenia: Secondary | ICD-10-CM | POA: Diagnosis not present

## 2017-12-27 DIAGNOSIS — C9110 Chronic lymphocytic leukemia of B-cell type not having achieved remission: Secondary | ICD-10-CM | POA: Diagnosis not present

## 2017-12-27 DIAGNOSIS — I10 Essential (primary) hypertension: Secondary | ICD-10-CM | POA: Diagnosis not present

## 2017-12-28 ENCOUNTER — Encounter: Payer: Self-pay | Admitting: *Deleted

## 2017-12-28 ENCOUNTER — Other Ambulatory Visit: Payer: Self-pay | Admitting: *Deleted

## 2017-12-28 DIAGNOSIS — N185 Chronic kidney disease, stage 5: Secondary | ICD-10-CM | POA: Diagnosis not present

## 2017-12-28 DIAGNOSIS — D61818 Other pancytopenia: Secondary | ICD-10-CM | POA: Diagnosis not present

## 2017-12-28 DIAGNOSIS — I509 Heart failure, unspecified: Secondary | ICD-10-CM | POA: Diagnosis not present

## 2017-12-28 DIAGNOSIS — I10 Essential (primary) hypertension: Secondary | ICD-10-CM | POA: Diagnosis not present

## 2017-12-28 DIAGNOSIS — C9110 Chronic lymphocytic leukemia of B-cell type not having achieved remission: Secondary | ICD-10-CM | POA: Diagnosis not present

## 2017-12-28 DIAGNOSIS — E46 Unspecified protein-calorie malnutrition: Secondary | ICD-10-CM | POA: Diagnosis not present

## 2017-12-28 NOTE — Patient Outreach (Signed)
[facility]) Care Management  12/28/2017  Orrin Yurkovich [birth year] [id number]  Spanish interpreter ID [id number] [name]  Pt recent discharged from the hospital on 5/4 and has been placed with Hospice services. RN reached out to pt today and confirmed Hospice services has started. Pt confirmed they visited today with services. Based upon the Hospice involved pt no long eligible for ongoing [facility] services as this information was explained and pt with understanding. RN will alert primary provider of pt's disposition with [facility] services and close this case. No further interventions or services to initiate on [facility] behalf at this time. Case will be closed.  Raina Mina, RN Care Management Coordinator [place] Office [phone]

## 2017-12-29 LAB — CULTURE, BLOOD (ROUTINE X 2)
CULTURE: NO GROWTH
CULTURE: NO GROWTH
Special Requests: ADEQUATE

## 2017-12-31 ENCOUNTER — Inpatient Hospital Stay: Payer: Medicare Other | Admitting: Hematology & Oncology

## 2017-12-31 ENCOUNTER — Inpatient Hospital Stay: Payer: Medicare Other

## 2018-01-04 DIAGNOSIS — N185 Chronic kidney disease, stage 5: Secondary | ICD-10-CM | POA: Diagnosis not present

## 2018-01-04 DIAGNOSIS — I509 Heart failure, unspecified: Secondary | ICD-10-CM | POA: Diagnosis not present

## 2018-01-04 DIAGNOSIS — I10 Essential (primary) hypertension: Secondary | ICD-10-CM | POA: Diagnosis not present

## 2018-01-04 DIAGNOSIS — E46 Unspecified protein-calorie malnutrition: Secondary | ICD-10-CM | POA: Diagnosis not present

## 2018-01-04 DIAGNOSIS — C9110 Chronic lymphocytic leukemia of B-cell type not having achieved remission: Secondary | ICD-10-CM | POA: Diagnosis not present

## 2018-01-04 DIAGNOSIS — D61818 Other pancytopenia: Secondary | ICD-10-CM | POA: Diagnosis not present

## 2018-01-05 ENCOUNTER — Ambulatory Visit: Payer: Self-pay | Admitting: *Deleted

## 2018-01-13 DIAGNOSIS — E46 Unspecified protein-calorie malnutrition: Secondary | ICD-10-CM | POA: Diagnosis not present

## 2018-01-13 DIAGNOSIS — D61818 Other pancytopenia: Secondary | ICD-10-CM | POA: Diagnosis not present

## 2018-01-13 DIAGNOSIS — I509 Heart failure, unspecified: Secondary | ICD-10-CM | POA: Diagnosis not present

## 2018-01-13 DIAGNOSIS — N185 Chronic kidney disease, stage 5: Secondary | ICD-10-CM | POA: Diagnosis not present

## 2018-01-13 DIAGNOSIS — C9110 Chronic lymphocytic leukemia of B-cell type not having achieved remission: Secondary | ICD-10-CM | POA: Diagnosis not present

## 2018-01-13 DIAGNOSIS — I10 Essential (primary) hypertension: Secondary | ICD-10-CM | POA: Diagnosis not present

## 2018-01-14 DIAGNOSIS — I10 Essential (primary) hypertension: Secondary | ICD-10-CM | POA: Diagnosis not present

## 2018-01-14 DIAGNOSIS — N185 Chronic kidney disease, stage 5: Secondary | ICD-10-CM | POA: Diagnosis not present

## 2018-01-14 DIAGNOSIS — I509 Heart failure, unspecified: Secondary | ICD-10-CM | POA: Diagnosis not present

## 2018-01-14 DIAGNOSIS — D61818 Other pancytopenia: Secondary | ICD-10-CM | POA: Diagnosis not present

## 2018-01-14 DIAGNOSIS — C9110 Chronic lymphocytic leukemia of B-cell type not having achieved remission: Secondary | ICD-10-CM | POA: Diagnosis not present

## 2018-01-14 DIAGNOSIS — E46 Unspecified protein-calorie malnutrition: Secondary | ICD-10-CM | POA: Diagnosis not present

## 2018-01-18 DIAGNOSIS — E46 Unspecified protein-calorie malnutrition: Secondary | ICD-10-CM | POA: Diagnosis not present

## 2018-01-18 DIAGNOSIS — N185 Chronic kidney disease, stage 5: Secondary | ICD-10-CM | POA: Diagnosis not present

## 2018-01-18 DIAGNOSIS — C9110 Chronic lymphocytic leukemia of B-cell type not having achieved remission: Secondary | ICD-10-CM | POA: Diagnosis not present

## 2018-01-18 DIAGNOSIS — I10 Essential (primary) hypertension: Secondary | ICD-10-CM | POA: Diagnosis not present

## 2018-01-18 DIAGNOSIS — I509 Heart failure, unspecified: Secondary | ICD-10-CM | POA: Diagnosis not present

## 2018-01-18 DIAGNOSIS — D61818 Other pancytopenia: Secondary | ICD-10-CM | POA: Diagnosis not present

## 2018-01-20 ENCOUNTER — Other Ambulatory Visit: Payer: Self-pay | Admitting: Family

## 2018-01-20 ENCOUNTER — Telehealth: Payer: Self-pay | Admitting: *Deleted

## 2018-01-20 DIAGNOSIS — C9110 Chronic lymphocytic leukemia of B-cell type not having achieved remission: Secondary | ICD-10-CM | POA: Diagnosis not present

## 2018-01-20 DIAGNOSIS — I509 Heart failure, unspecified: Secondary | ICD-10-CM | POA: Diagnosis not present

## 2018-01-20 DIAGNOSIS — I10 Essential (primary) hypertension: Secondary | ICD-10-CM | POA: Diagnosis not present

## 2018-01-20 DIAGNOSIS — D61818 Other pancytopenia: Secondary | ICD-10-CM | POA: Diagnosis not present

## 2018-01-20 DIAGNOSIS — N185 Chronic kidney disease, stage 5: Secondary | ICD-10-CM | POA: Diagnosis not present

## 2018-01-20 DIAGNOSIS — E46 Unspecified protein-calorie malnutrition: Secondary | ICD-10-CM | POA: Diagnosis not present

## 2018-01-20 NOTE — Telephone Encounter (Signed)
Received a call from hospice RN, Jinny Blossom, that patient is insistent that his port be removed before his death. Reviewed with Dr Marin Olp and he is okay with port being removed. Order will be placed.

## 2018-01-23 DIAGNOSIS — D61818 Other pancytopenia: Secondary | ICD-10-CM | POA: Diagnosis not present

## 2018-01-23 DIAGNOSIS — I10 Essential (primary) hypertension: Secondary | ICD-10-CM | POA: Diagnosis not present

## 2018-01-23 DIAGNOSIS — I509 Heart failure, unspecified: Secondary | ICD-10-CM | POA: Diagnosis not present

## 2018-01-23 DIAGNOSIS — N185 Chronic kidney disease, stage 5: Secondary | ICD-10-CM | POA: Diagnosis not present

## 2018-01-23 DIAGNOSIS — E46 Unspecified protein-calorie malnutrition: Secondary | ICD-10-CM | POA: Diagnosis not present

## 2018-01-23 DIAGNOSIS — C9110 Chronic lymphocytic leukemia of B-cell type not having achieved remission: Secondary | ICD-10-CM | POA: Diagnosis not present

## 2018-01-25 DIAGNOSIS — N185 Chronic kidney disease, stage 5: Secondary | ICD-10-CM | POA: Diagnosis not present

## 2018-01-25 DIAGNOSIS — C9110 Chronic lymphocytic leukemia of B-cell type not having achieved remission: Secondary | ICD-10-CM | POA: Diagnosis not present

## 2018-01-25 DIAGNOSIS — D61818 Other pancytopenia: Secondary | ICD-10-CM | POA: Diagnosis not present

## 2018-01-25 DIAGNOSIS — E46 Unspecified protein-calorie malnutrition: Secondary | ICD-10-CM | POA: Diagnosis not present

## 2018-01-25 DIAGNOSIS — I509 Heart failure, unspecified: Secondary | ICD-10-CM | POA: Diagnosis not present

## 2018-01-25 DIAGNOSIS — I10 Essential (primary) hypertension: Secondary | ICD-10-CM | POA: Diagnosis not present

## 2018-02-03 ENCOUNTER — Other Ambulatory Visit: Payer: Self-pay | Admitting: Radiology

## 2018-02-03 DIAGNOSIS — D61818 Other pancytopenia: Secondary | ICD-10-CM | POA: Diagnosis not present

## 2018-02-03 DIAGNOSIS — I509 Heart failure, unspecified: Secondary | ICD-10-CM | POA: Diagnosis not present

## 2018-02-03 DIAGNOSIS — N185 Chronic kidney disease, stage 5: Secondary | ICD-10-CM | POA: Diagnosis not present

## 2018-02-03 DIAGNOSIS — C9110 Chronic lymphocytic leukemia of B-cell type not having achieved remission: Secondary | ICD-10-CM | POA: Diagnosis not present

## 2018-02-03 DIAGNOSIS — I10 Essential (primary) hypertension: Secondary | ICD-10-CM | POA: Diagnosis not present

## 2018-02-03 DIAGNOSIS — E46 Unspecified protein-calorie malnutrition: Secondary | ICD-10-CM | POA: Diagnosis not present

## 2018-02-04 ENCOUNTER — Ambulatory Visit (HOSPITAL_COMMUNITY)
Admission: RE | Admit: 2018-02-04 | Discharge: 2018-02-04 | Disposition: A | Discharge status: Home / Self Care | Source: Ambulatory Visit | Attending: Family | Admitting: Family

## 2018-02-04 ENCOUNTER — Ambulatory Visit (HOSPITAL_COMMUNITY)
Admission: RE | Admit: 2018-02-04 | Discharge: 2018-02-04 | Disposition: A | Discharge status: Home / Self Care | Source: Ambulatory Visit | Attending: Hematology & Oncology | Admitting: Hematology & Oncology

## 2018-02-04 ENCOUNTER — Encounter (HOSPITAL_COMMUNITY): Payer: Self-pay

## 2018-02-04 DIAGNOSIS — N185 Chronic kidney disease, stage 5: Secondary | ICD-10-CM | POA: Diagnosis not present

## 2018-02-04 DIAGNOSIS — I130 Hypertensive heart and chronic kidney disease with heart failure and stage 1 through stage 4 chronic kidney disease, or unspecified chronic kidney disease: Secondary | ICD-10-CM | POA: Insufficient documentation

## 2018-02-04 DIAGNOSIS — Z87891 Personal history of nicotine dependence: Secondary | ICD-10-CM | POA: Insufficient documentation

## 2018-02-04 DIAGNOSIS — D61818 Other pancytopenia: Secondary | ICD-10-CM | POA: Diagnosis not present

## 2018-02-04 DIAGNOSIS — Z955 Presence of coronary angioplasty implant and graft: Secondary | ICD-10-CM | POA: Diagnosis not present

## 2018-02-04 DIAGNOSIS — N189 Chronic kidney disease, unspecified: Secondary | ICD-10-CM | POA: Insufficient documentation

## 2018-02-04 DIAGNOSIS — C9110 Chronic lymphocytic leukemia of B-cell type not having achieved remission: Secondary | ICD-10-CM | POA: Diagnosis not present

## 2018-02-04 DIAGNOSIS — Z8249 Family history of ischemic heart disease and other diseases of the circulatory system: Secondary | ICD-10-CM | POA: Diagnosis not present

## 2018-02-04 DIAGNOSIS — Z452 Encounter for adjustment and management of vascular access device: Secondary | ICD-10-CM | POA: Diagnosis present

## 2018-02-04 DIAGNOSIS — E46 Unspecified protein-calorie malnutrition: Secondary | ICD-10-CM | POA: Diagnosis not present

## 2018-02-04 DIAGNOSIS — I509 Heart failure, unspecified: Secondary | ICD-10-CM | POA: Diagnosis not present

## 2018-02-04 DIAGNOSIS — I10 Essential (primary) hypertension: Secondary | ICD-10-CM | POA: Diagnosis not present

## 2018-02-04 HISTORY — PX: IR REMOVAL TUN ACCESS W/ PORT W/O FL MOD SED: IMG2290

## 2018-02-04 LAB — CBC
HEMATOCRIT: 30.8 % — AB (ref 39.0–52.0)
HEMOGLOBIN: 10 g/dL — AB (ref 13.0–17.0)
MCH: 29.2 pg (ref 26.0–34.0)
MCHC: 32.5 g/dL (ref 30.0–36.0)
MCV: 90.1 fL (ref 78.0–100.0)
Platelets: 111 10*3/uL — ABNORMAL LOW (ref 150–400)
RBC: 3.42 MIL/uL — ABNORMAL LOW (ref 4.22–5.81)
RDW: 15.3 % (ref 11.5–15.5)
WBC: 5.5 10*3/uL (ref 4.0–10.5)

## 2018-02-04 LAB — APTT: aPTT: 28 seconds (ref 24–36)

## 2018-02-04 LAB — PROTIME-INR
INR: 1.09
Prothrombin Time: 14 seconds (ref 11.4–15.2)

## 2018-02-04 MED ORDER — SODIUM CHLORIDE 0.9 % IV SOLN
INTRAVENOUS | Status: DC
  Administered 2018-02-04: 10:00:00 via INTRAVENOUS

## 2018-02-04 MED ORDER — LIDOCAINE HCL (PF) 1 % IJ SOLN
INTRAMUSCULAR | Status: AC | PRN
  Administered 2018-02-04: 10 mL

## 2018-02-04 MED ORDER — LIDOCAINE HCL 1 % IJ SOLN
INTRAMUSCULAR | Status: AC
  Filled 2018-02-04: qty 20

## 2018-02-04 MED ORDER — NALOXONE HCL 0.4 MG/ML IJ SOLN
INTRAMUSCULAR | Status: AC
  Filled 2018-02-04: qty 1

## 2018-02-04 MED ORDER — MIDAZOLAM HCL 2 MG/2ML IJ SOLN
INTRAMUSCULAR | Status: AC | PRN
  Administered 2018-02-04: 1 mg via INTRAVENOUS

## 2018-02-04 MED ORDER — CEFAZOLIN SODIUM-DEXTROSE 2-4 GM/100ML-% IV SOLN
INTRAVENOUS | Status: AC
  Filled 2018-02-04: qty 100

## 2018-02-04 MED ORDER — FLUMAZENIL 0.5 MG/5ML IV SOLN
INTRAVENOUS | Status: AC
  Filled 2018-02-04: qty 5

## 2018-02-04 MED ORDER — LIDOCAINE-EPINEPHRINE (PF) 2 %-1:200000 IJ SOLN
INTRAMUSCULAR | Status: AC
  Filled 2018-02-04: qty 20

## 2018-02-04 MED ORDER — FENTANYL CITRATE (PF) 100 MCG/2ML IJ SOLN
INTRAMUSCULAR | Status: AC | PRN
  Administered 2018-02-04: 50 ug via INTRAVENOUS

## 2018-02-04 MED ORDER — MIDAZOLAM HCL 2 MG/2ML IJ SOLN
INTRAMUSCULAR | Status: AC
  Filled 2018-02-04: qty 4

## 2018-02-04 MED ORDER — CEFAZOLIN SODIUM-DEXTROSE 2-4 GM/100ML-% IV SOLN
2.0000 g | Freq: Once | INTRAVENOUS | Status: AC
  Administered 2018-02-04: 2 g via INTRAVENOUS

## 2018-02-04 MED ORDER — FENTANYL CITRATE (PF) 100 MCG/2ML IJ SOLN
INTRAMUSCULAR | Status: AC
  Filled 2018-02-04: qty 4

## 2018-02-04 NOTE — H&P (Addendum)
Chief Complaint: Patient was seen in consultation today for port removal at the request of [place] M  Referring Physician(s): [place] M  Supervising Physician: Arne Cleveland  Patient Status: [place]  History of Present Illness: Nathan Richards is a 72 y.o. male with CLL (chronic lymphocytic leukemia) ([place]). He had port placed on 08/26/17 and has now completed therapy. He is referred for port removal today. PMHx, meds, labs, imaging, allergies reviewed. Feels well, no recent fevers, chills, illness. Has been NPO today as directed. Family at bedside.    Past Medical History:  Diagnosis Date  . Anemia 12/24/2017  . CHF (congestive heart failure) ([place])   . Chronic kidney disease   . CLL (chronic lymphocytic leukemia) ([place])   . Dyspnea   . History of kidney stones   . Hypertension   . Urolithiasis    cystoscopy in 2002    Past Surgical History:  Procedure Laterality Date  . CORONARY ANGIOPLASTY WITH STENT PLACEMENT  02/05/2004   drug eluting to LAD plus baloon 1st diagonal  . INGUINAL HERNIA REPAIR     bilateral  . IR FLUORO GUIDE PORT INSERTION RIGHT  08/26/2017  . IR US GUIDE VASC ACCESS RIGHT  08/26/2017  . KNEE ARTHROSCOPY     right, done in [place]  . UMBILICAL HERNIA REPAIR      Allergies: Ace inhibitors; Amlodipine besylate; Beta adrenergic blockers; and Hydrochlorothiazide  Medications: Prior to Admission medications   Medication Sig Start Date End Date Taking? Authorizing Provider  acetaminophen (TYLENOL) 325 MG tablet Take 2 tablets (650 mg total) by mouth every 6 (six) hours as needed for mild pain, fever or headache (or Fever >/= 101). 12/26/17  Yes Florencia Reasons, MD  feeding supplement, ENSURE ENLIVE, (ENSURE ENLIVE) LIQD Take 237 mLs by mouth 3 (three) times daily between meals. 12/26/17  Yes Florencia Reasons, MD  furosemide (LASIX) 80 MG tablet Take 80 mg by mouth daily.   Yes [provider]  isosorbide-hydrALAZINE (BIDIL) 20-37.5 MG  tablet Take 1 tablet by mouth 3 (three) times daily. 07/24/17  Yes Alphonzo Grieve, MD  LORazepam (ATIVAN) 0.5 MG tablet Take one tablet sublingual 3 times a day x 4 days then every 8 hours as needed for nausea Patient taking differently: Take 0.5 mg by mouth every 8 (eight) hours as needed (nausea).  09/18/17  Yes Volanda Napoleon, MD  metoprolol succinate (TOPROL-XL) 100 MG 24 hr tablet Take 100 mg by mouth daily. 10/07/17  Yes [provider]  senna-docusate (SENOKOT-S) 8.6-50 MG tablet Take 1 tablet by mouth at bedtime. 12/26/17  Yes Florencia Reasons, MD  sodium bicarbonate 650 MG tablet Take 1 tablet (650 mg total) by mouth 2 (two) times daily. 12/26/17  Yes Florencia Reasons, MD  lidocaine-prilocaine (EMLA) cream Apply 1 application topically as needed. Patient taking differently: Apply 1 application topically as needed (for port).  08/27/17   Volanda Napoleon, MD  ondansetron (ZOFRAN) 4 MG tablet Take one tablet 3 x a day for 4 days then every 8 hours as needed for nausea Patient taking differently: Take 4 mg by mouth every 8 (eight) hours as needed for nausea.  09/17/17   Volanda Napoleon, MD     Family History  Problem Relation Age of Onset  . Diabetes Mother   . Coronary artery disease Mother   . Coronary artery disease Father        died  . Diabetes Father   . Diabetes Brother   . Diabetes  Brother        died    Social History   Socioeconomic History  . Marital status: Married    Spouse name: Not on file  . Number of children: Not on file  . Years of education: Not on file  . Highest education level: Not on file  Occupational History    Employer: UNEMPLOYED  Social Needs  . Financial resource strain: Not on file  . Food insecurity:    Worry: Not on file    Inability: Not on file  . Transportation needs:    Medical: Not on file    Non-medical: Not on file  Tobacco Use  . Smoking status: Former Smoker    Years: 31.00    Types: Cigarettes    Last attempt to quit: 12/22/1997     Years since quitting: 20.1  . Smokeless tobacco: Never Used  Substance and Sexual Activity  . Alcohol use: No  . Drug use: No    Comment: remote use of MJ and cocaine  . Sexual activity: Never  Lifestyle  . Physical activity:    Days per week: Not on file    Minutes per session: Not on file  . Stress: Not on file  Relationships  . Social connections:    Talks on phone: Not on file    Gets together: Not on file    Attends religious service: Not on file    Active member of club or organization: Not on file    Attends meetings of clubs or organizations: Not on file    Relationship status: Not on file  Other Topics Concern  . Not on file  Social History Narrative   Born [place], lived in [place]. For years before moving to [place]   Married, currently going through a divorce. Wife is in [place] alone    He has a son living here from a prior relationship   He and his current wife have a daughter and son together   Another son was killed at age 75   2 grand daughters in [place], [place], Ronith Berti lives locally   Disabled by psychiatric disease.    [employer] member    Review of Systems: A 12 point ROS discussed and pertinent positives are indicated in the HPI above.  All other systems are negative.  Review of Systems  Vital Signs: BP (!) 176/109   Pulse 76   Temp 98.1 F (36.7 C) (Oral)   Resp 16   Ht $R'5\' 2"'Uk$  (1.575 m)   Wt 114 lb (51.7 kg)   SpO2 100%   BMI 20.85 kg/m   Physical Exam  Constitutional: He is oriented to person, place, and time. He appears well-developed. No distress.  HENT:  Head: Normocephalic.  Mouth/Throat: Oropharynx is clear and moist.  Neck: Normal range of motion. No JVD present.  Cardiovascular: Normal rate, regular rhythm and normal heart sounds.  Pulmonary/Chest: Effort normal and breath sounds normal. No respiratory distress.  Neurological: He is alert and oriented to person, place, and time.  Skin: Skin is warm  and dry.  (R)chest port intact. Site well healed. No erythema or skin breakdown    Imaging: No results found.  Labs:  CBC: Recent Labs    12/10/17 0840 12/23/17 2200 12/24/17 1421 12/25/17 0413  WBC 9.4 2.4* 2.3* 1.5*  HGB 7.4* 5.5* 8.6* 8.2*  HCT 22.1* 16.0* 25.3* 24.3*  PLT 119* 141* 124* 115*  COAGS: Recent Labs    02/24/17 0615 08/26/17 0757  INR 1.05 1.08  APTT 26  --     BMP: Recent Labs    11/19/17 1038 12/10/17 0840 12/23/17 2200 12/25/17 0413 12/26/17 0355  NA 134* 143 135 132* 133*  K 3.6 3.8 3.7 3.5 3.7  CL 105 109* 106 102 100*  CO2 21* 21 18* 18* 21*  GLUCOSE 157* 114 111* 110* 148*  BUN 43* 49* 48* 51* 56*  CALCIUM 8.8* 9.1 8.1* 8.6* 8.0*  CREATININE 3.95* 4.00* 4.79* 5.03* 5.02*  GFRNONAA 14*  --  11* 11* 11*  GFRAA 16*  --  13* 12* 12*    LIVER FUNCTION TESTS: Recent Labs    11/19/17 0925 11/19/17 1038 12/10/17 0840 12/23/17 2200  BILITOT 0.6 0.6 0.6 0.5  AST $Re'15 15 21 27  'Gsk$ ALT 15 12* 22 24  ALKPHOS 89* 80 102* 76  PROT 6.2* 5.8* 6.7 6.1*  ALBUMIN 2.8* 2.6* 3.0* 2.9*    TUMOR MARKERS: No results for input(s): AFPTM, CEA, CA199, CHROMGRNA in the last 8760 hours.  Assessment and Plan: CLL, therapy complete For port removal today Labs ok Risks and benefits discussed with the patient including, but not limited to bleeding, infection, damage to adjacent structures.  All of the patient's questions were answered, patient is agreeable to proceed. Consent signed and in chart.   Thank you for this interesting consult.  I greatly enjoyed meeting Nathan Richards and look forward to participating in their care.  A copy of this report was sent to the requesting provider on this date.  Electronically Signed: Ascencion Dike, PA-C 02/04/2018, 10:34 AM   I spent a total of 20 minutes in face to face in clinical consultation, greater than 50% of which was counseling/coordinating care for port removal

## 2018-02-04 NOTE — Procedures (Signed)
  Procedure: R IJ Port removal   EBL:   minimal Complications:  none immediate  See full dictation in [facility].  Dillard Cannon MD Main # [phone] Pager  [phone]

## 2018-02-04 NOTE — Discharge Instructions (Signed)
Implanted Port Removal, Care After °Refer to this sheet in the next few weeks. These instructions provide you with information about caring for yourself after your procedure. Your health care provider may also give you more specific instructions. Your treatment has been planned according to current medical practices, but problems sometimes occur. Call your health care provider if you have any problems or questions after your procedure. °What can I expect after the procedure? °After the procedure, it is common to have: °· Soreness or pain near your incision. °· Some swelling or bruising near your incision. ° °Follow these instructions at home: °Medicines °· Take over-the-counter and prescription medicines only as told by your health care provider. °· If you were prescribed an antibiotic medicine, take it as told by your health care provider. Do not stop taking the antibiotic even if you start to feel better. °Bathing °· Do not take baths, swim, or use a hot tub until your health care provider approves. Ask your health care provider if you can take showers. You may only be allowed to take sponge baths for bathing. °Incision care °· Follow instructions from your health care provider about how to take care of your incision. Make sure you: °? Wash your hands with soap and water before you change your bandage (dressing). If soap and water are not available, use hand sanitizer. °? Change your dressing as told by your health care provider. °? Keep your dressing dry. °? Leave stitches (sutures), skin glue, or adhesive strips in place. These skin closures may need to stay in place for 2 weeks or longer. If adhesive strip edges start to loosen and curl up, you may trim the loose edges. Do not remove adhesive strips completely unless your health care provider tells you to do that. °· Check your incision area every day for signs of infection. Check for: °? More redness, swelling, or pain. °? More fluid or  blood. °? Warmth. °? Pus or a bad smell. °Driving °· If you received a sedative, do not drive for 24 hours after the procedure. °· If you did not receive a sedative, ask your health care provider when it is safe to drive. °Activity °· Return to your normal activities as told by your health care provider. Ask your health care provider what activities are safe for you. °· Until your health care provider says it is safe: °? Do not lift anything that is heavier than 10 lb (4.5 kg). °? Do not do activities that involve lifting your arms over your head. °General instructions °· Do not use any tobacco products, such as cigarettes, chewing tobacco, and e-cigarettes. Tobacco can delay healing. If you need help quitting, ask your health care provider. °· Keep all follow-up visits as told by your health care provider. This is important. °Contact a health care provider if: °· You have more redness, swelling, or pain around your incision. °· You have more fluid or blood coming from your incision. °· Your incision feels warm to the touch. °· You have pus or a bad smell coming from your incision. °· You have a fever. °· You have pain that is not relieved by your pain medicine. °Get help right away if: °· You have chest pain. °· You have difficulty breathing. °This information is not intended to replace advice given to you by your health care provider. Make sure you discuss any questions you have with your health care provider. °Document Released: 07/23/2015 Document Revised: 01/17/2016 Document Reviewed: 05/16/2015 °Elsevier Interactive Patient   Education © 2018 Else[employer] Inc[employer] °Moderate Conscious Sedation, Adult, Care After °These instructions provide you with information about caring for yourself after your procedure. Your health care provider may also give you more specific instructions. Your treatment has been planned according to current medical practices, but problems sometimes occur. Call your health care provider if you have  any problems or questions after your procedure. °What can I expect after the procedure? °After your procedure, it is common: °· To feel sleepy for several hours. °· To feel clumsy and have poor balance for several hours. °· To have poor judgment for several hours. °· To vomit if you eat too soon. ° °Follow these instructions at home: °For at least 24 hours after the procedure: ° °· Do not: °? Participate in activities where you could fall or become injured. °? Drive. °? Use heavy machinery. °? Drink alcohol. °? Take sleeping pills or medicines that cause drowsiness. °? Make important decisions or sign legal documents. °? Take care of children on your own. °· Rest. °Eating and drinking °· Follow the diet recommended by your health care provider. °· If you vomit: °? Drink water, juice, or soup when you can drink without vomiting. °? Make sure you have little or no nausea before eating solid foods. °General instructions °· Have a responsible adult stay with you until you are awake and alert. °· Take over-the-counter and prescription medicines only as told by your health care provider. °· If you smoke, do not smoke without supervision. °· Keep all follow-up visits as told by your health care provider. This is important. °Contact a health care provider if: °· You keep feeling nauseous or you keep vomiting. °· You feel light-headed. °· You develop a rash. °· You have a fever. °Get help right away if: °· You have trouble breathing. °This information is not intended to replace advice given to you by your health care provider. Make sure you discuss any questions you have with your health care provider. °Document Released: 06/01/2013 Document Revised: 01/14/2016 Document Reviewed: 12/01/2015 °Elsevier Interactive Patient Education © 2018 Elsevier Inc. ° °

## 2018-02-04 NOTE — Sedation Documentation (Signed)
Patient is resting comfortably with eyes closed in NAD. 

## 2018-02-05 DIAGNOSIS — I509 Heart failure, unspecified: Secondary | ICD-10-CM | POA: Diagnosis not present

## 2018-02-05 DIAGNOSIS — C9110 Chronic lymphocytic leukemia of B-cell type not having achieved remission: Secondary | ICD-10-CM | POA: Diagnosis not present

## 2018-02-05 DIAGNOSIS — N185 Chronic kidney disease, stage 5: Secondary | ICD-10-CM | POA: Diagnosis not present

## 2018-02-05 DIAGNOSIS — E46 Unspecified protein-calorie malnutrition: Secondary | ICD-10-CM | POA: Diagnosis not present

## 2018-02-05 DIAGNOSIS — D61818 Other pancytopenia: Secondary | ICD-10-CM | POA: Diagnosis not present

## 2018-02-05 DIAGNOSIS — I10 Essential (primary) hypertension: Secondary | ICD-10-CM | POA: Diagnosis not present

## 2018-02-08 DIAGNOSIS — I10 Essential (primary) hypertension: Secondary | ICD-10-CM | POA: Diagnosis not present

## 2018-02-08 DIAGNOSIS — D61818 Other pancytopenia: Secondary | ICD-10-CM | POA: Diagnosis not present

## 2018-02-08 DIAGNOSIS — N185 Chronic kidney disease, stage 5: Secondary | ICD-10-CM | POA: Diagnosis not present

## 2018-02-08 DIAGNOSIS — E46 Unspecified protein-calorie malnutrition: Secondary | ICD-10-CM | POA: Diagnosis not present

## 2018-02-08 DIAGNOSIS — C9110 Chronic lymphocytic leukemia of B-cell type not having achieved remission: Secondary | ICD-10-CM | POA: Diagnosis not present

## 2018-02-08 DIAGNOSIS — I509 Heart failure, unspecified: Secondary | ICD-10-CM | POA: Diagnosis not present

## 2018-02-15 DIAGNOSIS — C9110 Chronic lymphocytic leukemia of B-cell type not having achieved remission: Secondary | ICD-10-CM | POA: Diagnosis not present

## 2018-02-15 DIAGNOSIS — E46 Unspecified protein-calorie malnutrition: Secondary | ICD-10-CM | POA: Diagnosis not present

## 2018-02-15 DIAGNOSIS — N185 Chronic kidney disease, stage 5: Secondary | ICD-10-CM | POA: Diagnosis not present

## 2018-02-15 DIAGNOSIS — I509 Heart failure, unspecified: Secondary | ICD-10-CM | POA: Diagnosis not present

## 2018-02-15 DIAGNOSIS — I10 Essential (primary) hypertension: Secondary | ICD-10-CM | POA: Diagnosis not present

## 2018-02-15 DIAGNOSIS — D61818 Other pancytopenia: Secondary | ICD-10-CM | POA: Diagnosis not present

## 2018-02-19 DIAGNOSIS — D61818 Other pancytopenia: Secondary | ICD-10-CM | POA: Diagnosis not present

## 2018-02-19 DIAGNOSIS — C9110 Chronic lymphocytic leukemia of B-cell type not having achieved remission: Secondary | ICD-10-CM | POA: Diagnosis not present

## 2018-02-19 DIAGNOSIS — N185 Chronic kidney disease, stage 5: Secondary | ICD-10-CM | POA: Diagnosis not present

## 2018-02-19 DIAGNOSIS — I10 Essential (primary) hypertension: Secondary | ICD-10-CM | POA: Diagnosis not present

## 2018-02-19 DIAGNOSIS — E46 Unspecified protein-calorie malnutrition: Secondary | ICD-10-CM | POA: Diagnosis not present

## 2018-02-19 DIAGNOSIS — I509 Heart failure, unspecified: Secondary | ICD-10-CM | POA: Diagnosis not present

## 2018-02-22 DIAGNOSIS — I10 Essential (primary) hypertension: Secondary | ICD-10-CM | POA: Diagnosis not present

## 2018-02-22 DIAGNOSIS — C9110 Chronic lymphocytic leukemia of B-cell type not having achieved remission: Secondary | ICD-10-CM | POA: Diagnosis not present

## 2018-02-22 DIAGNOSIS — N185 Chronic kidney disease, stage 5: Secondary | ICD-10-CM | POA: Diagnosis not present

## 2018-02-22 DIAGNOSIS — I509 Heart failure, unspecified: Secondary | ICD-10-CM | POA: Diagnosis not present

## 2018-02-22 DIAGNOSIS — E46 Unspecified protein-calorie malnutrition: Secondary | ICD-10-CM | POA: Diagnosis not present

## 2018-02-22 DIAGNOSIS — D61818 Other pancytopenia: Secondary | ICD-10-CM | POA: Diagnosis not present

## 2018-02-23 DIAGNOSIS — E46 Unspecified protein-calorie malnutrition: Secondary | ICD-10-CM | POA: Diagnosis not present

## 2018-02-23 DIAGNOSIS — C9110 Chronic lymphocytic leukemia of B-cell type not having achieved remission: Secondary | ICD-10-CM | POA: Diagnosis not present

## 2018-02-23 DIAGNOSIS — I509 Heart failure, unspecified: Secondary | ICD-10-CM | POA: Diagnosis not present

## 2018-02-23 DIAGNOSIS — N185 Chronic kidney disease, stage 5: Secondary | ICD-10-CM | POA: Diagnosis not present

## 2018-02-23 DIAGNOSIS — D61818 Other pancytopenia: Secondary | ICD-10-CM | POA: Diagnosis not present

## 2018-02-23 DIAGNOSIS — I10 Essential (primary) hypertension: Secondary | ICD-10-CM | POA: Diagnosis not present

## 2018-02-24 DIAGNOSIS — I10 Essential (primary) hypertension: Secondary | ICD-10-CM | POA: Diagnosis not present

## 2018-02-24 DIAGNOSIS — C9110 Chronic lymphocytic leukemia of B-cell type not having achieved remission: Secondary | ICD-10-CM | POA: Diagnosis not present

## 2018-02-24 DIAGNOSIS — E46 Unspecified protein-calorie malnutrition: Secondary | ICD-10-CM | POA: Diagnosis not present

## 2018-02-24 DIAGNOSIS — N185 Chronic kidney disease, stage 5: Secondary | ICD-10-CM | POA: Diagnosis not present

## 2018-02-24 DIAGNOSIS — I509 Heart failure, unspecified: Secondary | ICD-10-CM | POA: Diagnosis not present

## 2018-02-24 DIAGNOSIS — D61818 Other pancytopenia: Secondary | ICD-10-CM | POA: Diagnosis not present

## 2018-02-26 DIAGNOSIS — I10 Essential (primary) hypertension: Secondary | ICD-10-CM | POA: Diagnosis not present

## 2018-02-26 DIAGNOSIS — I509 Heart failure, unspecified: Secondary | ICD-10-CM | POA: Diagnosis not present

## 2018-02-26 DIAGNOSIS — E46 Unspecified protein-calorie malnutrition: Secondary | ICD-10-CM | POA: Diagnosis not present

## 2018-02-26 DIAGNOSIS — N185 Chronic kidney disease, stage 5: Secondary | ICD-10-CM | POA: Diagnosis not present

## 2018-02-26 DIAGNOSIS — D61818 Other pancytopenia: Secondary | ICD-10-CM | POA: Diagnosis not present

## 2018-02-26 DIAGNOSIS — C9110 Chronic lymphocytic leukemia of B-cell type not having achieved remission: Secondary | ICD-10-CM | POA: Diagnosis not present

## 2018-03-01 DIAGNOSIS — D61818 Other pancytopenia: Secondary | ICD-10-CM | POA: Diagnosis not present

## 2018-03-01 DIAGNOSIS — I10 Essential (primary) hypertension: Secondary | ICD-10-CM | POA: Diagnosis not present

## 2018-03-01 DIAGNOSIS — I509 Heart failure, unspecified: Secondary | ICD-10-CM | POA: Diagnosis not present

## 2018-03-01 DIAGNOSIS — C9110 Chronic lymphocytic leukemia of B-cell type not having achieved remission: Secondary | ICD-10-CM | POA: Diagnosis not present

## 2018-03-01 DIAGNOSIS — E46 Unspecified protein-calorie malnutrition: Secondary | ICD-10-CM | POA: Diagnosis not present

## 2018-03-01 DIAGNOSIS — N185 Chronic kidney disease, stage 5: Secondary | ICD-10-CM | POA: Diagnosis not present

## 2018-03-03 DIAGNOSIS — I509 Heart failure, unspecified: Secondary | ICD-10-CM | POA: Diagnosis not present

## 2018-03-03 DIAGNOSIS — E46 Unspecified protein-calorie malnutrition: Secondary | ICD-10-CM | POA: Diagnosis not present

## 2018-03-03 DIAGNOSIS — D61818 Other pancytopenia: Secondary | ICD-10-CM | POA: Diagnosis not present

## 2018-03-03 DIAGNOSIS — I10 Essential (primary) hypertension: Secondary | ICD-10-CM | POA: Diagnosis not present

## 2018-03-03 DIAGNOSIS — N185 Chronic kidney disease, stage 5: Secondary | ICD-10-CM | POA: Diagnosis not present

## 2018-03-03 DIAGNOSIS — C9110 Chronic lymphocytic leukemia of B-cell type not having achieved remission: Secondary | ICD-10-CM | POA: Diagnosis not present

## 2018-03-04 DIAGNOSIS — I509 Heart failure, unspecified: Secondary | ICD-10-CM | POA: Diagnosis not present

## 2018-03-04 DIAGNOSIS — E46 Unspecified protein-calorie malnutrition: Secondary | ICD-10-CM | POA: Diagnosis not present

## 2018-03-04 DIAGNOSIS — C9110 Chronic lymphocytic leukemia of B-cell type not having achieved remission: Secondary | ICD-10-CM | POA: Diagnosis not present

## 2018-03-04 DIAGNOSIS — I10 Essential (primary) hypertension: Secondary | ICD-10-CM | POA: Diagnosis not present

## 2018-03-04 DIAGNOSIS — D61818 Other pancytopenia: Secondary | ICD-10-CM | POA: Diagnosis not present

## 2018-03-04 DIAGNOSIS — N185 Chronic kidney disease, stage 5: Secondary | ICD-10-CM | POA: Diagnosis not present

## 2018-03-08 DIAGNOSIS — I509 Heart failure, unspecified: Secondary | ICD-10-CM | POA: Diagnosis not present

## 2018-03-08 DIAGNOSIS — D61818 Other pancytopenia: Secondary | ICD-10-CM | POA: Diagnosis not present

## 2018-03-08 DIAGNOSIS — I10 Essential (primary) hypertension: Secondary | ICD-10-CM | POA: Diagnosis not present

## 2018-03-08 DIAGNOSIS — N185 Chronic kidney disease, stage 5: Secondary | ICD-10-CM | POA: Diagnosis not present

## 2018-03-08 DIAGNOSIS — C9110 Chronic lymphocytic leukemia of B-cell type not having achieved remission: Secondary | ICD-10-CM | POA: Diagnosis not present

## 2018-03-08 DIAGNOSIS — E46 Unspecified protein-calorie malnutrition: Secondary | ICD-10-CM | POA: Diagnosis not present

## 2018-03-11 DIAGNOSIS — I10 Essential (primary) hypertension: Secondary | ICD-10-CM | POA: Diagnosis not present

## 2018-03-11 DIAGNOSIS — D61818 Other pancytopenia: Secondary | ICD-10-CM | POA: Diagnosis not present

## 2018-03-11 DIAGNOSIS — N185 Chronic kidney disease, stage 5: Secondary | ICD-10-CM | POA: Diagnosis not present

## 2018-03-11 DIAGNOSIS — I509 Heart failure, unspecified: Secondary | ICD-10-CM | POA: Diagnosis not present

## 2018-03-11 DIAGNOSIS — E46 Unspecified protein-calorie malnutrition: Secondary | ICD-10-CM | POA: Diagnosis not present

## 2018-03-11 DIAGNOSIS — C9110 Chronic lymphocytic leukemia of B-cell type not having achieved remission: Secondary | ICD-10-CM | POA: Diagnosis not present

## 2018-03-15 DIAGNOSIS — N185 Chronic kidney disease, stage 5: Secondary | ICD-10-CM | POA: Diagnosis not present

## 2018-03-15 DIAGNOSIS — E46 Unspecified protein-calorie malnutrition: Secondary | ICD-10-CM | POA: Diagnosis not present

## 2018-03-15 DIAGNOSIS — I10 Essential (primary) hypertension: Secondary | ICD-10-CM | POA: Diagnosis not present

## 2018-03-15 DIAGNOSIS — C9110 Chronic lymphocytic leukemia of B-cell type not having achieved remission: Secondary | ICD-10-CM | POA: Diagnosis not present

## 2018-03-15 DIAGNOSIS — D61818 Other pancytopenia: Secondary | ICD-10-CM | POA: Diagnosis not present

## 2018-03-15 DIAGNOSIS — I509 Heart failure, unspecified: Secondary | ICD-10-CM | POA: Diagnosis not present

## 2018-03-16 ENCOUNTER — Ambulatory Visit: Payer: Medicare Other | Admitting: Emergency Medicine

## 2018-03-18 ENCOUNTER — Telehealth: Payer: Self-pay | Admitting: *Deleted

## 2018-03-18 DIAGNOSIS — N185 Chronic kidney disease, stage 5: Secondary | ICD-10-CM | POA: Diagnosis not present

## 2018-03-18 DIAGNOSIS — E46 Unspecified protein-calorie malnutrition: Secondary | ICD-10-CM | POA: Diagnosis not present

## 2018-03-18 DIAGNOSIS — I509 Heart failure, unspecified: Secondary | ICD-10-CM | POA: Diagnosis not present

## 2018-03-18 DIAGNOSIS — C9110 Chronic lymphocytic leukemia of B-cell type not having achieved remission: Secondary | ICD-10-CM | POA: Diagnosis not present

## 2018-03-18 DIAGNOSIS — D61818 Other pancytopenia: Secondary | ICD-10-CM | POA: Diagnosis not present

## 2018-03-18 DIAGNOSIS — I10 Essential (primary) hypertension: Secondary | ICD-10-CM | POA: Diagnosis not present

## 2018-03-18 MED ORDER — AMLODIPINE BESY-BENAZEPRIL HCL 5-10 MG PO CAPS: 1.0000 | ORAL_CAPSULE | Freq: Every day | ORAL | 6 refills | Status: DC

## 2018-03-18 NOTE — Telephone Encounter (Signed)
Nathan Richards, the patient's Hospice RN, notifying the office that patient's blood pressure has been steadily increasing. She would like to know what Dr Marin Olp would like to do. His blood pressure was 160's over 70's last week and this week is 170's over 70's.   Spoke with Dr Marin Olp and he would like patient to start on Lotrel $RemoveB'5mg'SiTOSMij$ /$Remo'10mg'pVOdm$  daily. Nathan Bake RN is aware of new prescription and confirmed pharmacy.

## 2018-03-23 DIAGNOSIS — I509 Heart failure, unspecified: Secondary | ICD-10-CM | POA: Diagnosis not present

## 2018-03-23 DIAGNOSIS — C9110 Chronic lymphocytic leukemia of B-cell type not having achieved remission: Secondary | ICD-10-CM | POA: Diagnosis not present

## 2018-03-23 DIAGNOSIS — N185 Chronic kidney disease, stage 5: Secondary | ICD-10-CM | POA: Diagnosis not present

## 2018-03-23 DIAGNOSIS — I10 Essential (primary) hypertension: Secondary | ICD-10-CM | POA: Diagnosis not present

## 2018-03-23 DIAGNOSIS — E46 Unspecified protein-calorie malnutrition: Secondary | ICD-10-CM | POA: Diagnosis not present

## 2018-03-23 DIAGNOSIS — D61818 Other pancytopenia: Secondary | ICD-10-CM | POA: Diagnosis not present

## 2018-03-25 DIAGNOSIS — E46 Unspecified protein-calorie malnutrition: Secondary | ICD-10-CM | POA: Diagnosis not present

## 2018-03-25 DIAGNOSIS — D61818 Other pancytopenia: Secondary | ICD-10-CM | POA: Diagnosis not present

## 2018-03-25 DIAGNOSIS — I509 Heart failure, unspecified: Secondary | ICD-10-CM | POA: Diagnosis not present

## 2018-03-25 DIAGNOSIS — N185 Chronic kidney disease, stage 5: Secondary | ICD-10-CM | POA: Diagnosis not present

## 2018-03-25 DIAGNOSIS — C9110 Chronic lymphocytic leukemia of B-cell type not having achieved remission: Secondary | ICD-10-CM | POA: Diagnosis not present

## 2018-03-25 DIAGNOSIS — I10 Essential (primary) hypertension: Secondary | ICD-10-CM | POA: Diagnosis not present

## 2018-03-30 DIAGNOSIS — C9110 Chronic lymphocytic leukemia of B-cell type not having achieved remission: Secondary | ICD-10-CM | POA: Diagnosis not present

## 2018-03-30 DIAGNOSIS — I509 Heart failure, unspecified: Secondary | ICD-10-CM | POA: Diagnosis not present

## 2018-03-30 DIAGNOSIS — I10 Essential (primary) hypertension: Secondary | ICD-10-CM | POA: Diagnosis not present

## 2018-03-30 DIAGNOSIS — N185 Chronic kidney disease, stage 5: Secondary | ICD-10-CM | POA: Diagnosis not present

## 2018-03-30 DIAGNOSIS — E46 Unspecified protein-calorie malnutrition: Secondary | ICD-10-CM | POA: Diagnosis not present

## 2018-03-30 DIAGNOSIS — D61818 Other pancytopenia: Secondary | ICD-10-CM | POA: Diagnosis not present

## 2018-04-01 DIAGNOSIS — N185 Chronic kidney disease, stage 5: Secondary | ICD-10-CM | POA: Diagnosis not present

## 2018-04-01 DIAGNOSIS — E46 Unspecified protein-calorie malnutrition: Secondary | ICD-10-CM | POA: Diagnosis not present

## 2018-04-01 DIAGNOSIS — I509 Heart failure, unspecified: Secondary | ICD-10-CM | POA: Diagnosis not present

## 2018-04-01 DIAGNOSIS — D61818 Other pancytopenia: Secondary | ICD-10-CM | POA: Diagnosis not present

## 2018-04-01 DIAGNOSIS — I10 Essential (primary) hypertension: Secondary | ICD-10-CM | POA: Diagnosis not present

## 2018-04-01 DIAGNOSIS — C9110 Chronic lymphocytic leukemia of B-cell type not having achieved remission: Secondary | ICD-10-CM | POA: Diagnosis not present

## 2018-04-05 DIAGNOSIS — C9110 Chronic lymphocytic leukemia of B-cell type not having achieved remission: Secondary | ICD-10-CM | POA: Diagnosis not present

## 2018-04-05 DIAGNOSIS — I509 Heart failure, unspecified: Secondary | ICD-10-CM | POA: Diagnosis not present

## 2018-04-05 DIAGNOSIS — N185 Chronic kidney disease, stage 5: Secondary | ICD-10-CM | POA: Diagnosis not present

## 2018-04-05 DIAGNOSIS — I10 Essential (primary) hypertension: Secondary | ICD-10-CM | POA: Diagnosis not present

## 2018-04-05 DIAGNOSIS — E46 Unspecified protein-calorie malnutrition: Secondary | ICD-10-CM | POA: Diagnosis not present

## 2018-04-05 DIAGNOSIS — D61818 Other pancytopenia: Secondary | ICD-10-CM | POA: Diagnosis not present

## 2018-04-06 DIAGNOSIS — I10 Essential (primary) hypertension: Secondary | ICD-10-CM | POA: Diagnosis not present

## 2018-04-06 DIAGNOSIS — C9110 Chronic lymphocytic leukemia of B-cell type not having achieved remission: Secondary | ICD-10-CM | POA: Diagnosis not present

## 2018-04-06 DIAGNOSIS — I509 Heart failure, unspecified: Secondary | ICD-10-CM | POA: Diagnosis not present

## 2018-04-06 DIAGNOSIS — N185 Chronic kidney disease, stage 5: Secondary | ICD-10-CM | POA: Diagnosis not present

## 2018-04-06 DIAGNOSIS — D61818 Other pancytopenia: Secondary | ICD-10-CM | POA: Diagnosis not present

## 2018-04-06 DIAGNOSIS — E46 Unspecified protein-calorie malnutrition: Secondary | ICD-10-CM | POA: Diagnosis not present

## 2018-04-13 DIAGNOSIS — I10 Essential (primary) hypertension: Secondary | ICD-10-CM | POA: Diagnosis not present

## 2018-04-13 DIAGNOSIS — I509 Heart failure, unspecified: Secondary | ICD-10-CM | POA: Diagnosis not present

## 2018-04-13 DIAGNOSIS — D61818 Other pancytopenia: Secondary | ICD-10-CM | POA: Diagnosis not present

## 2018-04-13 DIAGNOSIS — C9110 Chronic lymphocytic leukemia of B-cell type not having achieved remission: Secondary | ICD-10-CM | POA: Diagnosis not present

## 2018-04-13 DIAGNOSIS — E46 Unspecified protein-calorie malnutrition: Secondary | ICD-10-CM | POA: Diagnosis not present

## 2018-04-13 DIAGNOSIS — N185 Chronic kidney disease, stage 5: Secondary | ICD-10-CM | POA: Diagnosis not present

## 2018-04-14 ENCOUNTER — Other Ambulatory Visit: Payer: Self-pay | Admitting: *Deleted

## 2018-04-14 ENCOUNTER — Encounter: Payer: Self-pay | Admitting: *Deleted

## 2018-04-16 DIAGNOSIS — E46 Unspecified protein-calorie malnutrition: Secondary | ICD-10-CM | POA: Diagnosis not present

## 2018-04-16 DIAGNOSIS — C9110 Chronic lymphocytic leukemia of B-cell type not having achieved remission: Secondary | ICD-10-CM | POA: Diagnosis not present

## 2018-04-16 DIAGNOSIS — I10 Essential (primary) hypertension: Secondary | ICD-10-CM | POA: Diagnosis not present

## 2018-04-16 DIAGNOSIS — D61818 Other pancytopenia: Secondary | ICD-10-CM | POA: Diagnosis not present

## 2018-04-16 DIAGNOSIS — I509 Heart failure, unspecified: Secondary | ICD-10-CM | POA: Diagnosis not present

## 2018-04-16 DIAGNOSIS — N185 Chronic kidney disease, stage 5: Secondary | ICD-10-CM | POA: Diagnosis not present

## 2018-04-20 DIAGNOSIS — E46 Unspecified protein-calorie malnutrition: Secondary | ICD-10-CM | POA: Diagnosis not present

## 2018-04-20 DIAGNOSIS — I509 Heart failure, unspecified: Secondary | ICD-10-CM | POA: Diagnosis not present

## 2018-04-20 DIAGNOSIS — C9110 Chronic lymphocytic leukemia of B-cell type not having achieved remission: Secondary | ICD-10-CM | POA: Diagnosis not present

## 2018-04-20 DIAGNOSIS — D61818 Other pancytopenia: Secondary | ICD-10-CM | POA: Diagnosis not present

## 2018-04-20 DIAGNOSIS — N185 Chronic kidney disease, stage 5: Secondary | ICD-10-CM | POA: Diagnosis not present

## 2018-04-20 DIAGNOSIS — I10 Essential (primary) hypertension: Secondary | ICD-10-CM | POA: Diagnosis not present

## 2018-04-22 DIAGNOSIS — E46 Unspecified protein-calorie malnutrition: Secondary | ICD-10-CM | POA: Diagnosis not present

## 2018-04-22 DIAGNOSIS — N185 Chronic kidney disease, stage 5: Secondary | ICD-10-CM | POA: Diagnosis not present

## 2018-04-22 DIAGNOSIS — I509 Heart failure, unspecified: Secondary | ICD-10-CM | POA: Diagnosis not present

## 2018-04-22 DIAGNOSIS — C9110 Chronic lymphocytic leukemia of B-cell type not having achieved remission: Secondary | ICD-10-CM | POA: Diagnosis not present

## 2018-04-22 DIAGNOSIS — D61818 Other pancytopenia: Secondary | ICD-10-CM | POA: Diagnosis not present

## 2018-04-22 DIAGNOSIS — I10 Essential (primary) hypertension: Secondary | ICD-10-CM | POA: Diagnosis not present

## 2018-04-25 DIAGNOSIS — I10 Essential (primary) hypertension: Secondary | ICD-10-CM | POA: Diagnosis not present

## 2018-04-25 DIAGNOSIS — C9110 Chronic lymphocytic leukemia of B-cell type not having achieved remission: Secondary | ICD-10-CM | POA: Diagnosis not present

## 2018-04-25 DIAGNOSIS — E46 Unspecified protein-calorie malnutrition: Secondary | ICD-10-CM | POA: Diagnosis not present

## 2018-04-25 DIAGNOSIS — I509 Heart failure, unspecified: Secondary | ICD-10-CM | POA: Diagnosis not present

## 2018-04-25 DIAGNOSIS — N185 Chronic kidney disease, stage 5: Secondary | ICD-10-CM | POA: Diagnosis not present

## 2018-04-25 DIAGNOSIS — D61818 Other pancytopenia: Secondary | ICD-10-CM | POA: Diagnosis not present

## 2018-04-27 DIAGNOSIS — E46 Unspecified protein-calorie malnutrition: Secondary | ICD-10-CM | POA: Diagnosis not present

## 2018-04-27 DIAGNOSIS — N185 Chronic kidney disease, stage 5: Secondary | ICD-10-CM | POA: Diagnosis not present

## 2018-04-27 DIAGNOSIS — C9110 Chronic lymphocytic leukemia of B-cell type not having achieved remission: Secondary | ICD-10-CM | POA: Diagnosis not present

## 2018-04-27 DIAGNOSIS — I509 Heart failure, unspecified: Secondary | ICD-10-CM | POA: Diagnosis not present

## 2018-04-27 DIAGNOSIS — I10 Essential (primary) hypertension: Secondary | ICD-10-CM | POA: Diagnosis not present

## 2018-04-27 DIAGNOSIS — D61818 Other pancytopenia: Secondary | ICD-10-CM | POA: Diagnosis not present

## 2018-05-04 ENCOUNTER — Other Ambulatory Visit: Payer: Self-pay | Admitting: *Deleted

## 2018-05-04 DIAGNOSIS — E46 Unspecified protein-calorie malnutrition: Secondary | ICD-10-CM | POA: Diagnosis not present

## 2018-05-04 DIAGNOSIS — I10 Essential (primary) hypertension: Secondary | ICD-10-CM | POA: Diagnosis not present

## 2018-05-04 DIAGNOSIS — N185 Chronic kidney disease, stage 5: Secondary | ICD-10-CM | POA: Diagnosis not present

## 2018-05-04 DIAGNOSIS — C9110 Chronic lymphocytic leukemia of B-cell type not having achieved remission: Secondary | ICD-10-CM | POA: Diagnosis not present

## 2018-05-04 DIAGNOSIS — I509 Heart failure, unspecified: Secondary | ICD-10-CM | POA: Diagnosis not present

## 2018-05-04 DIAGNOSIS — D61818 Other pancytopenia: Secondary | ICD-10-CM | POA: Diagnosis not present

## 2018-05-11 DIAGNOSIS — E46 Unspecified protein-calorie malnutrition: Secondary | ICD-10-CM | POA: Diagnosis not present

## 2018-05-11 DIAGNOSIS — D61818 Other pancytopenia: Secondary | ICD-10-CM | POA: Diagnosis not present

## 2018-05-11 DIAGNOSIS — I10 Essential (primary) hypertension: Secondary | ICD-10-CM | POA: Diagnosis not present

## 2018-05-11 DIAGNOSIS — C9110 Chronic lymphocytic leukemia of B-cell type not having achieved remission: Secondary | ICD-10-CM | POA: Diagnosis not present

## 2018-05-11 DIAGNOSIS — N185 Chronic kidney disease, stage 5: Secondary | ICD-10-CM | POA: Diagnosis not present

## 2018-05-11 DIAGNOSIS — I509 Heart failure, unspecified: Secondary | ICD-10-CM | POA: Diagnosis not present

## 2018-05-12 ENCOUNTER — Telehealth: Payer: Self-pay | Admitting: *Deleted

## 2018-05-12 DIAGNOSIS — I509 Heart failure, unspecified: Secondary | ICD-10-CM | POA: Diagnosis not present

## 2018-05-12 DIAGNOSIS — N185 Chronic kidney disease, stage 5: Secondary | ICD-10-CM | POA: Diagnosis not present

## 2018-05-12 DIAGNOSIS — E46 Unspecified protein-calorie malnutrition: Secondary | ICD-10-CM | POA: Diagnosis not present

## 2018-05-12 DIAGNOSIS — I10 Essential (primary) hypertension: Secondary | ICD-10-CM | POA: Diagnosis not present

## 2018-05-12 DIAGNOSIS — D61818 Other pancytopenia: Secondary | ICD-10-CM | POA: Diagnosis not present

## 2018-05-12 DIAGNOSIS — C9110 Chronic lymphocytic leukemia of B-cell type not having achieved remission: Secondary | ICD-10-CM | POA: Diagnosis not present

## 2018-05-12 NOTE — Telephone Encounter (Signed)
Received notification from [facility] that patient status is much improved, and he may no longer be hospice appropriate. They would like for patient to be seen by the office for reassessment.   Will send a message to scheduler to have patient scheduled for appointment.

## 2018-05-18 DIAGNOSIS — D61818 Other pancytopenia: Secondary | ICD-10-CM | POA: Diagnosis not present

## 2018-05-18 DIAGNOSIS — I509 Heart failure, unspecified: Secondary | ICD-10-CM | POA: Diagnosis not present

## 2018-05-18 DIAGNOSIS — N185 Chronic kidney disease, stage 5: Secondary | ICD-10-CM | POA: Diagnosis not present

## 2018-05-18 DIAGNOSIS — C9110 Chronic lymphocytic leukemia of B-cell type not having achieved remission: Secondary | ICD-10-CM | POA: Diagnosis not present

## 2018-05-18 DIAGNOSIS — E46 Unspecified protein-calorie malnutrition: Secondary | ICD-10-CM | POA: Diagnosis not present

## 2018-05-18 DIAGNOSIS — I10 Essential (primary) hypertension: Secondary | ICD-10-CM | POA: Diagnosis not present

## 2018-05-20 ENCOUNTER — Encounter: Payer: Self-pay | Admitting: *Deleted

## 2018-05-24 DIAGNOSIS — D61818 Other pancytopenia: Secondary | ICD-10-CM | POA: Diagnosis not present

## 2018-05-24 DIAGNOSIS — N185 Chronic kidney disease, stage 5: Secondary | ICD-10-CM | POA: Diagnosis not present

## 2018-05-24 DIAGNOSIS — I10 Essential (primary) hypertension: Secondary | ICD-10-CM | POA: Diagnosis not present

## 2018-05-24 DIAGNOSIS — C9110 Chronic lymphocytic leukemia of B-cell type not having achieved remission: Secondary | ICD-10-CM | POA: Diagnosis not present

## 2018-05-24 DIAGNOSIS — E46 Unspecified protein-calorie malnutrition: Secondary | ICD-10-CM | POA: Diagnosis not present

## 2018-05-24 DIAGNOSIS — I509 Heart failure, unspecified: Secondary | ICD-10-CM | POA: Diagnosis not present

## 2018-05-25 DIAGNOSIS — C9110 Chronic lymphocytic leukemia of B-cell type not having achieved remission: Secondary | ICD-10-CM | POA: Diagnosis not present

## 2018-05-25 DIAGNOSIS — I509 Heart failure, unspecified: Secondary | ICD-10-CM | POA: Diagnosis not present

## 2018-05-25 DIAGNOSIS — I10 Essential (primary) hypertension: Secondary | ICD-10-CM | POA: Diagnosis not present

## 2018-05-25 DIAGNOSIS — D61818 Other pancytopenia: Secondary | ICD-10-CM | POA: Diagnosis not present

## 2018-05-25 DIAGNOSIS — E46 Unspecified protein-calorie malnutrition: Secondary | ICD-10-CM | POA: Diagnosis not present

## 2018-05-25 DIAGNOSIS — N185 Chronic kidney disease, stage 5: Secondary | ICD-10-CM | POA: Diagnosis not present

## 2018-05-26 ENCOUNTER — Other Ambulatory Visit: Payer: Self-pay | Admitting: *Deleted

## 2018-05-26 DIAGNOSIS — I509 Heart failure, unspecified: Secondary | ICD-10-CM | POA: Diagnosis not present

## 2018-05-26 DIAGNOSIS — D61818 Other pancytopenia: Secondary | ICD-10-CM | POA: Diagnosis not present

## 2018-05-26 DIAGNOSIS — C9110 Chronic lymphocytic leukemia of B-cell type not having achieved remission: Secondary | ICD-10-CM | POA: Diagnosis not present

## 2018-05-26 DIAGNOSIS — I10 Essential (primary) hypertension: Secondary | ICD-10-CM | POA: Diagnosis not present

## 2018-05-26 DIAGNOSIS — E46 Unspecified protein-calorie malnutrition: Secondary | ICD-10-CM | POA: Diagnosis not present

## 2018-05-26 DIAGNOSIS — N185 Chronic kidney disease, stage 5: Secondary | ICD-10-CM | POA: Diagnosis not present

## 2018-05-26 MED ORDER — FUROSEMIDE 80 MG PO TABS: 80.0000 mg | ORAL_TABLET | Freq: Every day | ORAL | 6 refills | Status: DC

## 2018-05-31 ENCOUNTER — Inpatient Hospital Stay: Payer: Medicare Other | Attending: Family | Admitting: Family

## 2018-05-31 ENCOUNTER — Telehealth: Payer: Self-pay | Admitting: *Deleted

## 2018-05-31 ENCOUNTER — Inpatient Hospital Stay: Payer: Medicare Other

## 2018-05-31 VITALS — BP 193/80 | HR 73 | Temp 98.5°F | Resp 18 | Wt 116.2 lb

## 2018-05-31 DIAGNOSIS — N184 Chronic kidney disease, stage 4 (severe): Secondary | ICD-10-CM

## 2018-05-31 DIAGNOSIS — N289 Disorder of kidney and ureter, unspecified: Secondary | ICD-10-CM | POA: Insufficient documentation

## 2018-05-31 DIAGNOSIS — D631 Anemia in chronic kidney disease: Secondary | ICD-10-CM | POA: Insufficient documentation

## 2018-05-31 DIAGNOSIS — C9110 Chronic lymphocytic leukemia of B-cell type not having achieved remission: Secondary | ICD-10-CM

## 2018-05-31 DIAGNOSIS — Z79899 Other long term (current) drug therapy: Secondary | ICD-10-CM | POA: Diagnosis not present

## 2018-05-31 DIAGNOSIS — I129 Hypertensive chronic kidney disease with stage 1 through stage 4 chronic kidney disease, or unspecified chronic kidney disease: Secondary | ICD-10-CM | POA: Diagnosis not present

## 2018-05-31 LAB — CBC WITH DIFFERENTIAL (CANCER CENTER ONLY)
BASOS PCT: 2 %
Basophils Absolute: 0.1 10*3/uL (ref 0.0–0.1)
EOS ABS: 2.2 10*3/uL — AB (ref 0.0–0.5)
Eosinophils Relative: 29 %
HCT: 24 % — ABNORMAL LOW (ref 38.7–49.9)
Hemoglobin: 8.1 g/dL — ABNORMAL LOW (ref 13.0–17.1)
Lymphocytes Relative: 37 %
Lymphs Abs: 3 10*3/uL (ref 0.9–3.3)
MCH: 29.8 pg (ref 28.0–33.4)
MCHC: 33.8 g/dL (ref 32.0–35.9)
MCV: 88.2 fL (ref 82.0–98.0)
MONO ABS: 0.2 10*3/uL (ref 0.1–0.9)
MONOS PCT: 3 %
Neutro Abs: 2.2 10*3/uL (ref 1.5–6.5)
Neutrophils Relative %: 29 %
Platelet Count: 92 10*3/uL — ABNORMAL LOW (ref 145–400)
RBC: 2.72 MIL/uL — ABNORMAL LOW (ref 4.20–5.70)
RDW: 14.2 % (ref 11.1–15.7)
WBC Count: 7.8 10*3/uL (ref 4.0–10.0)

## 2018-05-31 LAB — CMP (CANCER CENTER ONLY)
ALBUMIN: 3.6 g/dL (ref 3.5–5.0)
ALT: 6 U/L (ref 0–44)
ANION GAP: 8 (ref 5–15)
AST: 14 U/L — ABNORMAL LOW (ref 15–41)
Alkaline Phosphatase: 133 U/L — ABNORMAL HIGH (ref 38–126)
BILIRUBIN TOTAL: 0.5 mg/dL (ref 0.3–1.2)
BUN: 54 mg/dL — ABNORMAL HIGH (ref 8–23)
CO2: 20 mmol/L — AB (ref 22–32)
Calcium: 9.3 mg/dL (ref 8.9–10.3)
Chloride: 109 mmol/L (ref 98–111)
Creatinine: 4.73 mg/dL (ref 0.61–1.24)
GFR, Est AFR Am: 13 mL/min — ABNORMAL LOW (ref >60)
GFR, Estimated: 11 mL/min — ABNORMAL LOW (ref >60)
GLUCOSE: 130 mg/dL — AB (ref 70–99)
POTASSIUM: 4.1 mmol/L (ref 3.5–5.1)
SODIUM: 137 mmol/L (ref 135–145)
TOTAL PROTEIN: 7.2 g/dL (ref 6.5–8.1)

## 2018-05-31 LAB — RETICULOCYTES
RBC.: 2.76 MIL/uL — ABNORMAL LOW (ref 4.20–5.82)
RETIC COUNT ABSOLUTE: 27.6 10*3/uL — AB (ref 34.8–93.9)
Retic Ct Pct: 1 % (ref 0.8–1.8)

## 2018-05-31 LAB — SAMPLE TO BLOOD BANK

## 2018-05-31 NOTE — Progress Notes (Signed)
Hematology and Oncology Follow Up Visit  Edger Husain [id number] [birth year] 72 y.o. 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 Coon is here today for follow-up. He is doing remarkably well and no longer need hospice. He is walking for exercise and driving again. He plans to go to [place] to visit his daughter and granddaughters for christmas.  He denies fatigue.  His Hgb is 8.1, MCV 88, WBC count 7.8 and platelet count 92.  He has had no episodes of bleeding, no bruising or petechiae.  No fever, chills, n/v, cough, rash, dizziness, SOB, chest pain, palpitations, abdominal pain or changes in bowel or bladder habits.  No swelling, tenderness, numbness or tingling in her extremities. No c/o pain.  No lymphadenopathy noted on exam.  He has a good appetite and is staying well hydrated. His weight is stable.   ECOG Performance Status: 1 - Symptomatic but completely ambulatory  Medications:  Allergies as of 05/31/2018      Reactions   Ace Inhibitors    REACTION: cough   Amlodipine Besylate    REACTION: Impotence   Beta Adrenergic Blockers    REACTION: Impotence   Hydrochlorothiazide Other (See Comments)   May have contributed to his hypercalcemia      Medication List        Accurate as of 05/31/18  2:34 PM. Always use your most recent med list.          acetaminophen 325 MG tablet Commonly known as:  TYLENOL Take 2 tablets (650 mg total) by mouth every 6 (six) hours as needed for mild pain, fever or headache (or Fever >/= 101).   amLODipine-benazepril 5-10 MG capsule Commonly known as:  LOTREL Take 1 capsule by mouth daily.   furosemide 80 MG tablet Commonly known as:  LASIX Take 1 tablet (80 mg total) by mouth daily.   hydrOXYzine 10 MG tablet Commonly known as:  ATARAX/VISTARIL Take 10  mg by mouth 3 (three) times daily as needed.   isosorbide-hydrALAZINE 20-37.5 MG tablet Commonly known as:  BIDIL Take 1 tablet by mouth 3 (three) times daily.   LORazepam 0.5 MG tablet Commonly known as:  ATIVAN Take one tablet sublingual 3 times a day x 4 days then every 8 hours as needed for nausea   prochlorperazine 10 MG tablet Commonly known as:  COMPAZINE Take 10 mg by mouth every 4 (four) hours as needed for nausea or vomiting.   ranitidine 150 MG tablet Commonly known as:  ZANTAC Take 150 mg by mouth daily.   senna-docusate 8.6-50 MG tablet Commonly known as:  Senokot-S Take 1 tablet by mouth at bedtime.   sodium bicarbonate 650 MG tablet Take 1 tablet (650 mg total) by mouth 2 (two) times daily.   temazepam 15 MG capsule Commonly known as:  RESTORIL Take 15 mg by mouth at bedtime as needed for sleep.       Allergies:  Allergies  Allergen Reactions  . Ace Inhibitors     REACTION: cough  . Amlodipine Besylate     REACTION: Impotence  . Beta Adrenergic Blockers     REACTION: Impotence  . Hydrochlorothiazide Other (See Comments)    May have contributed to his hypercalcemia    Past Medical History, Surgical history, Social history, and Family  History were reviewed and updated.  Review of Systems: All other 10 point review of systems is negative.   Physical Exam:  weight is 116 lb 4 oz (52.7 kg). His oral temperature is 98.5 F (36.9 C). His blood pressure is 193/80 (abnormal) and his pulse is 73. His respiration is 18 and oxygen saturation is 100%.   Wt Readings from Last 3 Encounters:  05/31/18 116 lb 4 oz (52.7 kg)  02/04/18 114 lb (51.7 kg)  12/25/17 116 lb 3.2 oz (52.7 kg)    Ocular: Sclerae unicteric, pupils equal, round and reactive to light Ear-nose-throat: Oropharynx clear, dentition fair Lymphatic: No cervical, supraclavicular or axillary adenopathy Lungs no rales or rhonchi, good excursion bilaterally Heart regular rate and rhythm, no  murmur appreciated Abd soft, nontender, positive bowel sounds, no liver or spleen tip palpated on exam, no fluid wave  MSK no focal spinal tenderness, no joint edema Neuro: non-focal, well-oriented, appropriate affect Breasts: Deferred   Lab Results  Component Value Date   WBC 7.8 05/31/2018   HGB 8.1 (L) 05/31/2018   HCT 24.0 (L) 05/31/2018   MCV 88.2 05/31/2018   PLT 92 (L) 05/31/2018   Lab Results  Component Value Date   FERRITIN 236 12/10/2017   IRON 66 12/10/2017   TIBC 204 12/10/2017   UIBC 138 12/10/2017   IRONPCTSAT 32 (L) 12/10/2017   Lab Results  Component Value Date   RETICCTPCT 1.9 (H) 12/10/2017   RBC 2.72 (L) 05/31/2018   Lab Results  Component Value Date   KPAFRELGTCHN 112.3 (H) 12/10/2017   LAMBDASER 137.8 (H) 12/10/2017   KAPLAMBRATIO 0.81 12/10/2017   Lab Results  Component Value Date   IGGSERUM 1,253 12/10/2017   IGGSERUM 1,237 12/10/2017   IGA 155 12/10/2017   IGA 155 12/10/2017   IGMSERUM 135 12/10/2017   IGMSERUM 131 12/10/2017   Lab Results  Component Value Date   TOTALPROTELP 6.2 12/10/2017   ALBUMINELP 3.5 12/10/2017   A1GS 0.3 12/10/2017   A2GS 0.7 12/10/2017   BETS 0.6 (L) 12/10/2017   BETA2SER 4.8 12/03/2007   GAMS 1.1 12/10/2017   MSPIKE 0.3 (H) 12/10/2017   SPEI * 12/03/2007     Chemistry      Component Value Date/Time   NA 133 (L) 12/26/2017 0355   NA 139 08/27/2017 0751   K 3.7 12/26/2017 0355   K 3.7 08/27/2017 0751   CL 100 (L) 12/26/2017 0355   CL 106 08/27/2017 0751   CO2 21 (L) 12/26/2017 0355   CO2 19 08/27/2017 0751   BUN 56 (H) 12/26/2017 0355   BUN 60 (H) 08/27/2017 0751   CREATININE 5.02 (H) 12/26/2017 0355   CREATININE 4.00 (HH) 12/10/2017 0840   CREATININE 3.4 (HH) 08/27/2017 0751      Component Value Date/Time   CALCIUM 8.0 (L) 12/26/2017 0355   CALCIUM 8.4 08/27/2017 0751   ALKPHOS 76 12/23/2017 2200   ALKPHOS 109 (H) 08/27/2017 0751   AST 27 12/23/2017 2200   AST 21 12/10/2017 0840   ALT 24  12/23/2017 2200   ALT 22 12/10/2017 0840   ALT 15 08/27/2017 0751   BILITOT 0.5 12/23/2017 2200   BILITOT 0.6 12/10/2017 0840      Impression and Plan: Mr. Mcgath is a very pleasant 72 yo Hispanic gentleman with CLL, stage IV. Amazingly he is doing quite well and no longer needs hospice. He has no complaints at this time.  We will see what his iron studies show and bring him  back in for infusion if needed.  Protein studies are pending.  We will go ahead and plan to see him back in January after the holidays.  He will contact our office with any questions or concerns. We can certainly see him sooner if need be.   Laverna Peace, NP 10/7/20192:34 PM

## 2018-05-31 NOTE — Telephone Encounter (Signed)
Critical Value Creatinine 4.73 Laverna Peace NP notified. No orders at this time.

## 2018-06-01 DIAGNOSIS — I10 Essential (primary) hypertension: Secondary | ICD-10-CM | POA: Diagnosis not present

## 2018-06-01 DIAGNOSIS — C9110 Chronic lymphocytic leukemia of B-cell type not having achieved remission: Secondary | ICD-10-CM | POA: Diagnosis not present

## 2018-06-01 DIAGNOSIS — I509 Heart failure, unspecified: Secondary | ICD-10-CM | POA: Diagnosis not present

## 2018-06-01 DIAGNOSIS — D61818 Other pancytopenia: Secondary | ICD-10-CM | POA: Diagnosis not present

## 2018-06-01 DIAGNOSIS — E46 Unspecified protein-calorie malnutrition: Secondary | ICD-10-CM | POA: Diagnosis not present

## 2018-06-01 DIAGNOSIS — N185 Chronic kidney disease, stage 5: Secondary | ICD-10-CM | POA: Diagnosis not present

## 2018-06-01 LAB — KAPPA/LAMBDA LIGHT CHAINS
Kappa free light chain: 127.4 mg/L — ABNORMAL HIGH (ref 3.3–19.4)
Kappa, lambda light chain ratio: 0.97 (ref 0.26–1.65)
LAMDA FREE LIGHT CHAINS: 131 mg/L — AB (ref 5.7–26.3)

## 2018-06-01 LAB — IRON AND TIBC
Iron: 72 ug/dL (ref 42–163)
SATURATION RATIOS: 35 % — AB (ref 42–163)
TIBC: 204 ug/dL (ref 202–409)
UIBC: 132 ug/dL

## 2018-06-01 LAB — IGG, IGA, IGM
IGM (IMMUNOGLOBULIN M), SRM: 76 mg/dL (ref 20–172)
IgA: 160 mg/dL (ref 61–437)
IgG (Immunoglobin G), Serum: 1378 mg/dL (ref 700–1600)

## 2018-06-01 LAB — FERRITIN: Ferritin: 271 ng/mL (ref 24–336)

## 2018-06-02 DIAGNOSIS — C9110 Chronic lymphocytic leukemia of B-cell type not having achieved remission: Secondary | ICD-10-CM | POA: Diagnosis not present

## 2018-06-02 DIAGNOSIS — I509 Heart failure, unspecified: Secondary | ICD-10-CM | POA: Diagnosis not present

## 2018-06-02 DIAGNOSIS — D61818 Other pancytopenia: Secondary | ICD-10-CM | POA: Diagnosis not present

## 2018-06-02 DIAGNOSIS — N185 Chronic kidney disease, stage 5: Secondary | ICD-10-CM | POA: Diagnosis not present

## 2018-06-02 DIAGNOSIS — E46 Unspecified protein-calorie malnutrition: Secondary | ICD-10-CM | POA: Diagnosis not present

## 2018-06-02 DIAGNOSIS — I10 Essential (primary) hypertension: Secondary | ICD-10-CM | POA: Diagnosis not present

## 2018-06-02 LAB — PROTEIN ELECTROPHORESIS, SERUM, WITH REFLEX
A/G Ratio: 1.2 (ref 0.7–1.7)
ALPHA-2-GLOBULIN: 0.7 g/dL (ref 0.4–1.0)
Albumin ELP: 3.7 g/dL (ref 2.9–4.4)
Alpha-1-Globulin: 0.3 g/dL (ref 0.0–0.4)
Beta Globulin: 0.7 g/dL (ref 0.7–1.3)
GLOBULIN, TOTAL: 3 g/dL (ref 2.2–3.9)
Gamma Globulin: 1.4 g/dL (ref 0.4–1.8)
TOTAL PROTEIN ELP: 6.7 g/dL (ref 6.0–8.5)

## 2018-06-10 ENCOUNTER — Telehealth: Payer: Self-pay

## 2018-06-10 NOTE — Telephone Encounter (Signed)
Phone call placed to patient to introduce Palliative Care and to offer a visit with NP. Visit scheduled for Thursday 06/17/18

## 2018-06-17 ENCOUNTER — Other Ambulatory Visit: Payer: Medicare Other | Admitting: Internal Medicine

## 2018-06-17 DIAGNOSIS — D649 Anemia, unspecified: Secondary | ICD-10-CM

## 2018-06-17 DIAGNOSIS — Z515 Encounter for palliative care: Secondary | ICD-10-CM

## 2018-06-17 NOTE — Progress Notes (Signed)
PALLIATIVE CARE CONSULT VISIT   PATIENT NAME: Emmerson Taddei DOB: 04/12/1946 MRN: [id number]  PRIMARY CARE PROVIDER:   Horald Pollen, MD  REFERRING PROVIDER:  Dr. Burney Gauze RESPONSIBLE PARTY:  Self    Emergency contact  Josefa Half)  [phone]    RECOMMENDATIONS and PLAN:  1.  Fatigue associated with anemia  D64.9:  Chronic anemia.  Encouraged to increase iron rich nutrition.  Increase hydration.  Scheduled rest periods.  Continue to monitor.  2.  Palliative care encounter  Z51.5:  Pt desires "no use of machines" if his health declines.  Previous discussions with son.  His goal is to maintain and improve strength and endurance if possible.  He plans on continuing walks and gardening in the Spring.  I spent 45 minutes providing this consultation,  from 10:45am to 11:30am at home. More than 50% of the time in this consultation was spent coordinating communication with patient.   HISTORY OF PRESENT ILLNESS:  Philopater Mucha is a 72 y.o. year old male with multiple medical problems including CLL, anemia.  He was recently discharged from Hospice care due to > 6 month life expectancy and Palliative Care was asked to help address goals of care. He previously completed therapies for leukemia.  He states that he has a good appetite and eats often, he walks regularly in his neighborhood and is able to drive as desired. No reports of pain.     CODE STATUS:  CPR  PPS: 50% HOSPICE ELIGIBILITY/DIAGNOSIS: TBD  PAST MEDICAL HISTORY:  Past Medical History:  Diagnosis Date  . Anemia 12/24/2017  . CHF (congestive heart failure) ([place])   . Chronic kidney disease   . CLL (chronic lymphocytic leukemia) ([place])   . Dyspnea   . History of kidney stones   . Hypertension   . Urolithiasis    cystoscopy in 2002    SOCIAL HX:  Social History   Tobacco Use  . Smoking status: Former Smoker    Years: 31.00    Types: Cigarettes    Last attempt to quit: 12/22/1997    Years since  quitting: 20.4  . Smokeless tobacco: Never Used  Substance Use Topics  . Alcohol use: No    ALLERGIES:  Allergies  Allergen Reactions  . Ace Inhibitors     REACTION: cough  . Amlodipine Besylate     REACTION: Impotence  . Beta Adrenergic Blockers     REACTION: Impotence  . Hydrochlorothiazide Other (See Comments)    May have contributed to his hypercalcemia     PERTINENT MEDICATIONS:  Outpatient Encounter Medications as of 06/17/2018  Medication Sig  . acetaminophen (TYLENOL) 325 MG tablet Take 2 tablets (650 mg total) by mouth every 6 (six) hours as needed for mild pain, fever or headache (or Fever >/= 101).  Marland Kitchen amLODipine-benazepril (LOTREL) 5-10 MG capsule Take 1 capsule by mouth daily.  . furosemide (LASIX) 80 MG tablet Take 1 tablet (80 mg total) by mouth daily.  . hydrOXYzine (ATARAX/VISTARIL) 10 MG tablet Take 10 mg by mouth 3 (three) times daily as needed.  . isosorbide-hydrALAZINE (BIDIL) 20-37.5 MG tablet Take 1 tablet by mouth 3 (three) times daily.  Marland Kitchen LORazepam (ATIVAN) 0.5 MG tablet Take one tablet sublingual 3 times a day x 4 days then every 8 hours as needed for nausea (Patient taking differently: Take 0.5 mg by mouth every 8 (eight) hours as needed (nausea). )  . prochlorperazine (COMPAZINE) 10 MG tablet Take 10 mg by mouth every 4 (  four) hours as needed for nausea or vomiting.  . ranitidine (ZANTAC) 150 MG tablet Take 150 mg by mouth daily.  Marland Kitchen senna-docusate (SENOKOT-S) 8.6-50 MG tablet Take 1 tablet by mouth at bedtime.  . sodium bicarbonate 650 MG tablet Take 1 tablet (650 mg total) by mouth 2 (two) times daily.  . temazepam (RESTORIL) 15 MG capsule Take 15 mg by mouth at bedtime as needed for sleep.   No facility-administered encounter medications on file as of 06/17/2018.     PHYSICAL EXAM:   BP 150/90  P78  R 20 Sats 99% rm air  General:  Thin and chronically ill elderly male sitting on sofa,  In NAD Cardiovascular: regular rate and rhythm Pulmonary:  clear all lung fields Abdomen: soft, nontender, + bowel sounds GU: no suprapubic tenderness Extremities: no edema, decreased muscle mass Skin: exposed skin is intact.  Pale in color Neurological:  Alert and oriented x 3, generalized weakness  Gonzella Lex, NP-C

## 2018-07-02 ENCOUNTER — Telehealth: Payer: Self-pay

## 2018-07-02 ENCOUNTER — Other Ambulatory Visit: Payer: Medicare Other | Admitting: Internal Medicine

## 2018-07-02 DIAGNOSIS — I10 Essential (primary) hypertension: Secondary | ICD-10-CM

## 2018-07-02 DIAGNOSIS — D649 Anemia, unspecified: Secondary | ICD-10-CM

## 2018-07-02 DIAGNOSIS — Z515 Encounter for palliative care: Secondary | ICD-10-CM

## 2018-07-02 MED ORDER — HEPARIN SODIUM (PORCINE) 5000 UNIT/ML IJ SOLN
5000.00 | INTRAMUSCULAR | Status: DC
Start: 2018-07-01 — End: 2018-07-02

## 2018-07-02 MED ORDER — HYDRALAZINE HCL 20 MG/ML IJ SOLN
20.00 | INTRAMUSCULAR | Status: DC
Start: ? — End: 2018-07-02

## 2018-07-02 MED ORDER — NITROGLYCERIN 0.4 MG SL SUBL
0.40 | SUBLINGUAL_TABLET | SUBLINGUAL | Status: DC
Start: ? — End: 2018-07-02

## 2018-07-02 MED ORDER — ALUM & MAG HYDROXIDE-SIMETH 200-200-20 MG/5ML PO SUSP
30.00 | ORAL | Status: DC
Start: ? — End: 2018-07-02

## 2018-07-02 MED ORDER — CARVEDILOL 6.25 MG PO TABS
6.25 | ORAL_TABLET | ORAL | Status: DC
Start: 2018-07-01 — End: 2018-07-02

## 2018-07-02 MED ORDER — AMLODIPINE BESYLATE 10 MG PO TABS
10.00 | ORAL_TABLET | ORAL | Status: DC
Start: 2018-07-02 — End: 2018-07-02

## 2018-07-02 MED ORDER — HYDRALAZINE HCL 25 MG PO TABS
25.00 | ORAL_TABLET | ORAL | Status: DC
Start: 2018-07-01 — End: 2018-07-02

## 2018-07-02 MED ORDER — ONDANSETRON HCL 4 MG/2ML IJ SOLN
4.00 | INTRAMUSCULAR | Status: DC
Start: ? — End: 2018-07-02

## 2018-07-02 NOTE — Telephone Encounter (Signed)
Phone call placed to patient to schedule visit with Palliative NP. Scheduled for today

## 2018-07-02 NOTE — Telephone Encounter (Signed)
Phone call placed to patient to schedule visit with NP. Line busy

## 2018-07-02 NOTE — Telephone Encounter (Signed)
Attempted to contact family member to schedule visit with NP. Line sounds like a fax machine

## 2018-07-05 ENCOUNTER — Other Ambulatory Visit: Payer: Medicare Other

## 2018-07-05 ENCOUNTER — Ambulatory Visit: Payer: Medicare Other | Admitting: Family

## 2018-07-06 NOTE — Progress Notes (Signed)
PALLIATIVE CARE CONSULT VISIT   PATIENT NAME: Nathan Richards DOB: 15-Nov-1945 MRN: [id number]  PRIMARY CARE PROVIDER:   Horald Pollen, MD  REFERRING PROVIDER:  Dr. Burney Gauze RESPONSIBLE PARTY:  Self    Emergency contact  Josefa Half)  [phone]    RECOMMENDATIONS and PLAN:  1.  Elevated BP reading with diagnosis of hypertension I10:  Reviewed new antihypertensives and set up weekly pill box according to Rx instructions.  Reinforced avoidance of NSAID use.  Daily BP readings at home. Explanation of coorelation of HTN and CKD.  Continue to monitor  2.  Fatigue associated with anemia  D64.9:  Chronic anemia and Stage V ckd.   No desire to initiate hemodialysis. Encouraged hydration.  Repeat CBC and CMP. FU with oncology as planned.  3.  Palliative care encounter  Z51.5:  Readdressed advanced care planning.  Pt desires "no use of machines" and refuses dialysis. States that he is ok whenever his death occurs but does want chest compressions attempted.  Reviewed re-admission to Hospice but pt declines same. His goal is to improve management of hypertension. Discussed S&S of worsening of renal function and ensured that Hospice services could be restarted at anytime.  Messages left on voicemail of son per pt request for updates without return call.  Palliative care will follow-up with pt.  I spent 45 minutes providing this consultation,  from 10:45am to 11:30am at home. More than 50% of the time in this consultation was spent coordinating communication with patient.   HISTORY OF PRESENT ILLNESS: Follow-up with [name].  He was admitted to the hospital from 11/5-11/7 due to a hypertensive crisis(202/108) and retinal hemorrhage that was detected during a routine eye exam.  Medical records note worsening of CKD with crea/bun at 6.08/72.  He was recently discharged from Hospice care however, his CKD has advanced and patient declines initiating hemodialysis.    CODE STATUS:   CPR  PPS: 50% HOSPICE ELIGIBILITY/DIAGNOSIS: YES/ CKD stage V without plans for hemodialysis  PAST MEDICAL HISTORY:  Past Medical History:  Diagnosis Date  . Anemia 12/24/2017  . CHF (congestive heart failure) ([place])   . Chronic kidney disease   . CLL (chronic lymphocytic leukemia) ([place])   . Dyspnea   . History of kidney stones   . Hypertension   . Urolithiasis    cystoscopy in 2002    SOCIAL HX:  Social History   Tobacco Use  . Smoking status: Former Smoker    Years: 31.00    Types: Cigarettes    Last attempt to quit: 12/22/1997    Years since quitting: 20.4  . Smokeless tobacco: Never Used  Substance Use Topics  . Alcohol use: No    ALLERGIES:  Allergies  Allergen Reactions  . Ace Inhibitors     REACTION: cough  . Amlodipine Besylate     REACTION: Impotence  . Beta Adrenergic Blockers     REACTION: Impotence  . Hydrochlorothiazide Other (See Comments)    May have contributed to his hypercalcemia     PERTINENT MEDICATIONS:  Outpatient Encounter Medications as of 06/17/2018  Medication Sig  . acetaminophen (TYLENOL) 325 MG tablet Take 2 tablets (650 mg total) by mouth every 6 (six) hours as needed for mild pain, fever or headache (or Fever >/= 101).  Marland Kitchen amLODipine-benazepril (LOTREL) 5-10 MG capsule Take 1 capsule by mouth daily.  . furosemide (LASIX) 80 MG tablet Take 1 tablet (80 mg total) by mouth daily.  . hydrOXYzine (ATARAX/VISTARIL) 10  MG tablet Take 10 mg by mouth 3 (three) times daily as needed.  . isosorbide-hydrALAZINE (BIDIL) 20-37.5 MG tablet Take 1 tablet by mouth 3 (three) times daily.  Marland Kitchen LORazepam (ATIVAN) 0.5 MG tablet Take one tablet sublingual 3 times a day x 4 days then every 8 hours as needed for nausea (Patient taking differently: Take 0.5 mg by mouth every 8 (eight) hours as needed (nausea). )  . prochlorperazine (COMPAZINE) 10 MG tablet Take 10 mg by mouth every 4 (four) hours as needed for nausea or vomiting.  . ranitidine (ZANTAC) 150 MG  tablet Take 150 mg by mouth daily.  Marland Kitchen senna-docusate (SENOKOT-S) 8.6-50 MG tablet Take 1 tablet by mouth at bedtime.  . sodium bicarbonate 650 MG tablet Take 1 tablet (650 mg total) by mouth 2 (two) times daily.  . temazepam (RESTORIL) 15 MG capsule Take 15 mg by mouth at bedtime as needed for sleep.   No facility-administered encounter medications on file as of 06/17/2018.     PHYSICAL EXAM:   BP 170/90  P80  R 22 Sats 99% rm air  General:  Thin and chronically ill elderly male sitting on sofa,  In NAD Cardiovascular: regular rate and rhythm Pulmonary: clear all lung fields Abdomen: soft, nontender, + bowel sounds GU: no suprapubic tenderness Extremities: no edema, decreased muscle mass Skin: exposed skin is intact.  Pale/ jaundiced in color Neurological:  Alert and oriented x 3, generalized weakness  Gonzella Lex, NP-C

## 2018-07-07 ENCOUNTER — Inpatient Hospital Stay: Payer: Medicare Other

## 2018-07-07 ENCOUNTER — Other Ambulatory Visit: Payer: Self-pay | Admitting: Family

## 2018-07-07 ENCOUNTER — Inpatient Hospital Stay: Payer: Medicare Other | Attending: Family | Admitting: Family

## 2018-07-07 DIAGNOSIS — C9110 Chronic lymphocytic leukemia of B-cell type not having achieved remission: Secondary | ICD-10-CM

## 2018-07-07 DIAGNOSIS — D631 Anemia in chronic kidney disease: Secondary | ICD-10-CM

## 2018-07-07 DIAGNOSIS — N184 Chronic kidney disease, stage 4 (severe): Secondary | ICD-10-CM

## 2018-07-07 DIAGNOSIS — D508 Other iron deficiency anemias: Secondary | ICD-10-CM

## 2018-08-10 ENCOUNTER — Other Ambulatory Visit: Payer: Medicare Other | Admitting: Internal Medicine

## 2018-08-10 DIAGNOSIS — Z515 Encounter for palliative care: Secondary | ICD-10-CM

## 2018-08-16 NOTE — Progress Notes (Signed)
    PALLIATIVE CARE CONSULT VISIT   PATIENT NAME: Nathan Richards DOB: [birth year] MRN: [id number]  PRIMARY CARE PROVIDER:   Horald Pollen, MD  REFERRING PROVIDER:  Dr. Burney Gauze RESPONSIBLE PARTY:  Self    Emergency contact  Josefa Half)  [phone]    RECOMMENDATIONS and PLAN:   Palliative care encounter Z51.5  1.  Elevated BP :  Improved with use of Carvedilol 12.$RemoveBeforeDE'5mg'rMzMbcANzSPhbBP$  qd and Hydralazine$RemoveBeforeDEI'25mg'aUqwpCtiabTwzfKV$  bid.  Continue to monitor  2.  Fatigue associated with anemia  D64.9:  Chronic anemia and Stage V ckd.  At baseline. Still no plans to initiate hemodialysis. Encouraged hydration and adequate nutrition.  Repeat  FU with oncology as planned.  3.  Palliative care encounter  Z51.5: Goals of care:  Desires continued infusions for CLL. Refuses hemodialysis. Declines readmission to Hospice at this time but would entertain it in the future if necessary.  Pt. Plans to relocate from sons home to live independantly. Palliative care will follow-up with pt. In aprox 1 month.  I spent 40 minutes providing this consultation,  from 10:30am to 11:10am at home. More than 50% of the time in this consultation was spent coordinating communication with patient.   HISTORY OF PRESENT ILLNESS: Follow-up with Nathan Richards. He reports feeling well and has been without any additional acute illnesses.  Good appetite and remains active.  CODE STATUS:  CPR without intubation  PPS: 50% HOSPICE ELIGIBILITY/DIAGNOSIS: YES/ CKD stage V without plans for hemodialysis.  Pt declines  PAST MEDICAL HISTORY:  Past Medical History:  Diagnosis Date  . Anemia 12/24/2017  . CHF (congestive heart failure) ([place])   . Chronic kidney disease   . CLL (chronic lymphocytic leukemia) ([place])   . Dyspnea   . History of kidney stones   . Hypertension   . Urolithiasis    cystoscopy in 2002      PHYSICAL EXAM:   General:  Thin and chronically ill elderly male.  In NAD Cardiovascular: regular rate and rhythm Pulmonary:  clear all lung fields Abdomen: soft, nontender, + bowel sounds GU: no suprapubic tenderness Extremities: no edema, decreased muscle mass Skin: exposed skin is intact.  Pale in color Neurological:  Alert and oriented x 3, generalized weakness  Gonzella Lex, NP-C

## 2018-08-30 ENCOUNTER — Telehealth: Payer: Self-pay | Admitting: Family

## 2018-08-30 ENCOUNTER — Encounter: Payer: Self-pay | Admitting: Family

## 2018-08-30 ENCOUNTER — Inpatient Hospital Stay: Payer: Medicare Other | Attending: Family

## 2018-08-30 ENCOUNTER — Inpatient Hospital Stay: Payer: Medicare Other

## 2018-08-30 ENCOUNTER — Inpatient Hospital Stay (HOSPITAL_BASED_OUTPATIENT_CLINIC_OR_DEPARTMENT_OTHER): Payer: Medicare Other | Admitting: Family

## 2018-08-30 DIAGNOSIS — N184 Chronic kidney disease, stage 4 (severe): Secondary | ICD-10-CM

## 2018-08-30 DIAGNOSIS — D508 Other iron deficiency anemias: Secondary | ICD-10-CM

## 2018-08-30 DIAGNOSIS — N289 Disorder of kidney and ureter, unspecified: Secondary | ICD-10-CM

## 2018-08-30 DIAGNOSIS — Z79899 Other long term (current) drug therapy: Secondary | ICD-10-CM | POA: Insufficient documentation

## 2018-08-30 DIAGNOSIS — D631 Anemia in chronic kidney disease: Secondary | ICD-10-CM | POA: Insufficient documentation

## 2018-08-30 DIAGNOSIS — C9110 Chronic lymphocytic leukemia of B-cell type not having achieved remission: Secondary | ICD-10-CM | POA: Insufficient documentation

## 2018-08-30 DIAGNOSIS — N19 Unspecified kidney failure: Secondary | ICD-10-CM | POA: Diagnosis present

## 2018-08-30 LAB — CBC WITH DIFFERENTIAL (CANCER CENTER ONLY)
Abs Immature Granulocytes: 0.03 10*3/uL (ref 0.00–0.07)
Basophils Absolute: 0 10*3/uL (ref 0.0–0.1)
Basophils Relative: 0 %
EOS ABS: 1.5 10*3/uL — AB (ref 0.0–0.5)
EOS PCT: 14 %
HEMATOCRIT: 22.7 % — AB (ref 39.0–52.0)
HEMOGLOBIN: 7.5 g/dL — AB (ref 13.0–17.0)
Immature Granulocytes: 0 %
LYMPHS ABS: 5.9 10*3/uL — AB (ref 0.7–4.0)
Lymphocytes Relative: 54 %
MCH: 30.4 pg (ref 26.0–34.0)
MCHC: 33 g/dL (ref 30.0–36.0)
MCV: 91.9 fL (ref 80.0–100.0)
MONO ABS: 0.2 10*3/uL (ref 0.1–1.0)
MONOS PCT: 2 %
Neutro Abs: 3.3 10*3/uL (ref 1.7–7.7)
Neutrophils Relative %: 30 %
Platelet Count: 117 10*3/uL — ABNORMAL LOW (ref 150–400)
RBC: 2.47 MIL/uL — ABNORMAL LOW (ref 4.22–5.81)
RDW: 14.2 % (ref 11.5–15.5)
WBC Count: 11 10*3/uL — ABNORMAL HIGH (ref 4.0–10.5)
nRBC: 0 % (ref 0.0–0.2)

## 2018-08-30 LAB — CMP (CANCER CENTER ONLY)
ALBUMIN: 3.8 g/dL (ref 3.5–5.0)
ALK PHOS: 126 U/L (ref 38–126)
ALT: 5 U/L (ref 0–44)
ANION GAP: 9 (ref 5–15)
AST: 10 U/L — ABNORMAL LOW (ref 15–41)
BILIRUBIN TOTAL: 0.4 mg/dL (ref 0.3–1.2)
BUN: 64 mg/dL — ABNORMAL HIGH (ref 8–23)
CALCIUM: 8.8 mg/dL — AB (ref 8.9–10.3)
CO2: 18 mmol/L — ABNORMAL LOW (ref 22–32)
Chloride: 108 mmol/L (ref 98–111)
Creatinine: 6.18 mg/dL (ref 0.61–1.24)
GFR, EST AFRICAN AMERICAN: 10 mL/min — AB (ref >60)
GFR, Estimated: 8 mL/min — ABNORMAL LOW (ref >60)
GLUCOSE: 89 mg/dL (ref 70–99)
Potassium: 4.2 mmol/L (ref 3.5–5.1)
Sodium: 135 mmol/L (ref 135–145)
TOTAL PROTEIN: 7 g/dL (ref 6.5–8.1)

## 2018-08-30 LAB — SAMPLE TO BLOOD BANK

## 2018-08-30 LAB — RETICULOCYTES
Immature Retic Fract: 9.7 % (ref 2.3–15.9)
RBC.: 2.47 MIL/uL — ABNORMAL LOW (ref 4.22–5.81)
RETIC COUNT ABSOLUTE: 29.6 10*3/uL (ref 19.0–186.0)
Retic Ct Pct: 1.2 % (ref 0.4–3.1)

## 2018-08-30 MED ORDER — DARBEPOETIN ALFA 300 MCG/0.6ML IJ SOSY
300.0000 ug | PREFILLED_SYRINGE | Freq: Once | INTRAMUSCULAR | Status: AC
  Administered 2018-08-30: 300 ug via SUBCUTANEOUS

## 2018-08-30 MED ORDER — DARBEPOETIN ALFA 300 MCG/0.6ML IJ SOSY
PREFILLED_SYRINGE | INTRAMUSCULAR | Status: AC
  Filled 2018-08-30: qty 0.6

## 2018-08-30 NOTE — Progress Notes (Signed)
Hematology and Oncology Follow Up Visit  Nathan Richards [id number] 1946/01/25 72 y.o. 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 is here today with his interpretor for follow-up. He states that he is doing quite well and has no complaints at this time.  His Hgb is down to 7.5, MCV 91.9. WBC count is 11.0 and platelet count 117.  He denies having had any episodes of bleeding, no bruising or petechiae.  He has had no fever, chills, n/v, cough, rash, dizziness, SOB, chest pain, palpitations, abdominal pain or changes in bowel or bladder habits.  No swelling, tenderness, numbness or tingling in his extremities.  No lymphadenopathy noted on his exam.  He states that he has a good appetite and is staying well hydrated. His weight is stable.   ECOG Performance Status: 0 - Asymptomatic  Medications:  Allergies as of 08/30/2018      Reactions   Ace Inhibitors    REACTION: cough   Amlodipine Besylate    REACTION: Impotence   Beta Adrenergic Blockers    REACTION: Impotence   Hydrochlorothiazide Other (See Comments)   May have contributed to his hypercalcemia      Medication List       Accurate as of August 30, 2018  2:47 PM. Always use your most recent med list.        amLODipine 10 MG tablet Commonly known as:  NORVASC Take by mouth.   carvedilol 12.5 MG tablet Commonly known as:  COREG TOME UNA TABLETA POR V?A ORAL DOS VECES AL D?A   hydrALAZINE 25 MG tablet Commonly known as:  APRESOLINE TOME UNA TABLETA POR V?A ORAL CADA OCHO HORAS   isosorbide-hydrALAZINE 20-37.5 MG tablet Commonly known as:  BIDIL Take 1 tablet by mouth 3 (three) times daily.       Allergies:  Allergies  Allergen Reactions  . Ace Inhibitors     REACTION: cough  . Amlodipine Besylate    REACTION: Impotence  . Beta Adrenergic Blockers     REACTION: Impotence  . Hydrochlorothiazide Other (See Comments)    May have contributed to his hypercalcemia    Past Medical History, Surgical history, Social history, and Family History were reviewed and updated.  Review of Systems: All other 10 point review of systems is negative.   Physical Exam:  vitals were not taken for this visit.   Wt Readings from Last 3 Encounters:  05/31/18 116 lb 4 oz (52.7 kg)  02/04/18 114 lb (51.7 kg)  12/25/17 116 lb 3.2 oz (52.7 kg)    Ocular: Sclerae unicteric, pupils equal, round and reactive to light Ear-nose-throat: Oropharynx clear, dentition fair Lymphatic: No cervical, supraclavicular or axillary adenopathy Lungs no rales or rhonchi, good excursion bilaterally Heart regular rate and rhythm, no murmur appreciated Abd soft, nontender, positive bowel sounds, no liver or spleen tip palpated on exam, no fluid wave  MSK no focal spinal tenderness, no joint edema Neuro: non-focal, well-oriented, appropriate affect Breasts: Deferred   Lab Results  Component Value Date   WBC 11.0 (H) 08/30/2018   HGB 7.5 (L) 08/30/2018   HCT 22.7 (L) 08/30/2018   MCV 91.9 08/30/2018   PLT 117 (L) 08/30/2018   Lab Results  Component Value Date   FERRITIN 271 05/31/2018  IRON 72 05/31/2018   TIBC 204 05/31/2018   UIBC 132 05/31/2018   IRONPCTSAT 35 (L) 05/31/2018   Lab Results  Component Value Date   RETICCTPCT 1.2 08/30/2018   RBC 2.47 (L) 08/30/2018   RBC 2.47 (L) 08/30/2018   Lab Results  Component Value Date   KPAFRELGTCHN 127.4 (H) 05/31/2018   LAMBDASER 131.0 (H) 05/31/2018   KAPLAMBRATIO 0.97 05/31/2018   Lab Results  Component Value Date   IGGSERUM 1,378 05/31/2018   IGA 160 05/31/2018   IGMSERUM 76 05/31/2018   Lab Results  Component Value Date   TOTALPROTELP 6.7 05/31/2018   ALBUMINELP 3.7 05/31/2018   A1GS 0.3 05/31/2018   A2GS 0.7 05/31/2018   BETS 0.7 05/31/2018    BETA2SER 4.8 12/03/2007   GAMS 1.4 05/31/2018   MSPIKE Not Observed 05/31/2018   SPEI * 12/03/2007     Chemistry      Component Value Date/Time   NA 137 05/31/2018 1352   NA 139 08/27/2017 0751   K 4.1 05/31/2018 1352   K 3.7 08/27/2017 0751   CL 109 05/31/2018 1352   CL 106 08/27/2017 0751   CO2 20 (L) 05/31/2018 1352   CO2 19 08/27/2017 0751   BUN 54 (H) 05/31/2018 1352   BUN 60 (H) 08/27/2017 0751   CREATININE 4.73 (HH) 05/31/2018 1352   CREATININE 3.4 (HH) 08/27/2017 0751      Component Value Date/Time   CALCIUM 9.3 05/31/2018 1352   CALCIUM 8.4 08/27/2017 0751   ALKPHOS 133 (H) 05/31/2018 1352   ALKPHOS 109 (H) 08/27/2017 0751   AST 14 (L) 05/31/2018 1352   ALT 6 05/31/2018 1352   ALT 15 08/27/2017 0751   BILITOT 0.5 05/31/2018 1352       Impression and Plan: [name] is a very pleasant 73 yo Hispanic gentleman with CLL, stage IV. He continues to do well and has no complaints at this time.  He is asymptomatic with Hgb of 7.5.  He was given Aranesp today and we will see him in another 6 weeks for MD follow-up.  His in agreement with the plan and will contact our office with any questions or concerns. We can certainly see him sooner if need be.   Laverna Peace, NP 1/6/20202:47 PM

## 2018-08-30 NOTE — Patient Instructions (Signed)

## 2018-08-30 NOTE — Telephone Encounter (Signed)
Appointment added for injection per verbal from Burman Freestone, NP

## 2018-08-31 LAB — PROTEIN ELECTROPHORESIS, SERUM
A/G RATIO SPE: 1.1 (ref 0.7–1.7)
ALBUMIN ELP: 3.5 g/dL (ref 2.9–4.4)
ALPHA-2-GLOBULIN: 0.9 g/dL (ref 0.4–1.0)
Alpha-1-Globulin: 0.3 g/dL (ref 0.0–0.4)
BETA GLOBULIN: 0.7 g/dL (ref 0.7–1.3)
GAMMA GLOBULIN: 1.3 g/dL (ref 0.4–1.8)
GLOBULIN, TOTAL: 3.2 g/dL (ref 2.2–3.9)
Total Protein ELP: 6.7 g/dL (ref 6.0–8.5)

## 2018-08-31 LAB — KAPPA/LAMBDA LIGHT CHAINS
KAPPA FREE LGHT CHN: 109 mg/L — AB (ref 3.3–19.4)
Kappa, lambda light chain ratio: 0.75 (ref 0.26–1.65)
Lambda free light chains: 145.2 mg/L — ABNORMAL HIGH (ref 5.7–26.3)

## 2018-08-31 LAB — FERRITIN: Ferritin: 274 ng/mL (ref 24–336)

## 2018-08-31 LAB — IRON AND TIBC
IRON: 44 ug/dL (ref 42–163)
Saturation Ratios: 24 % (ref 20–55)
TIBC: 183 ug/dL — AB (ref 202–409)
UIBC: 138 ug/dL (ref 117–376)

## 2018-08-31 LAB — IGG, IGA, IGM
IgA: 190 mg/dL (ref 61–437)
IgG (Immunoglobin G), Serum: 1230 mg/dL (ref 700–1600)
IgM (Immunoglobulin M), Srm: 130 mg/dL (ref 20–172)

## 2018-08-31 LAB — LACTATE DEHYDROGENASE: LDH: 167 U/L (ref 98–192)

## 2018-09-28 ENCOUNTER — Other Ambulatory Visit: Payer: Medicare Other | Admitting: Internal Medicine

## 2018-09-28 DIAGNOSIS — Z515 Encounter for palliative care: Secondary | ICD-10-CM

## 2018-10-11 ENCOUNTER — Encounter: Payer: Self-pay | Admitting: Hematology & Oncology

## 2018-10-11 ENCOUNTER — Inpatient Hospital Stay: Payer: Medicare Other

## 2018-10-11 ENCOUNTER — Other Ambulatory Visit: Payer: Self-pay

## 2018-10-11 ENCOUNTER — Inpatient Hospital Stay: Payer: Medicare Other | Attending: Family | Admitting: Family

## 2018-10-11 VITALS — BP 185/107 | HR 74 | Temp 97.8°F | Resp 19 | Wt 120.0 lb

## 2018-10-11 DIAGNOSIS — N184 Chronic kidney disease, stage 4 (severe): Secondary | ICD-10-CM

## 2018-10-11 DIAGNOSIS — Z79899 Other long term (current) drug therapy: Secondary | ICD-10-CM | POA: Diagnosis not present

## 2018-10-11 DIAGNOSIS — N289 Disorder of kidney and ureter, unspecified: Secondary | ICD-10-CM | POA: Diagnosis not present

## 2018-10-11 DIAGNOSIS — D631 Anemia in chronic kidney disease: Secondary | ICD-10-CM | POA: Diagnosis not present

## 2018-10-11 DIAGNOSIS — C9110 Chronic lymphocytic leukemia of B-cell type not having achieved remission: Secondary | ICD-10-CM | POA: Diagnosis present

## 2018-10-11 DIAGNOSIS — D508 Other iron deficiency anemias: Secondary | ICD-10-CM

## 2018-10-11 LAB — CBC WITH DIFFERENTIAL (CANCER CENTER ONLY)
Abs Immature Granulocytes: 0 10*3/uL (ref 0.00–0.07)
Basophils Absolute: 0.1 10*3/uL (ref 0.0–0.1)
Basophils Relative: 1 %
Eosinophils Absolute: 2.3 10*3/uL — ABNORMAL HIGH (ref 0.0–0.5)
Eosinophils Relative: 21 %
HCT: 25.9 % — ABNORMAL LOW (ref 39.0–52.0)
HEMOGLOBIN: 8.4 g/dL — AB (ref 13.0–17.0)
Lymphocytes Relative: 39 %
Lymphs Abs: 4.3 10*3/uL — ABNORMAL HIGH (ref 0.7–4.0)
MCH: 31.2 pg (ref 26.0–34.0)
MCHC: 32.4 g/dL (ref 30.0–36.0)
MCV: 96.3 fL (ref 80.0–100.0)
Monocytes Absolute: 0.1 10*3/uL (ref 0.1–1.0)
Monocytes Relative: 1 %
Neutro Abs: 4.2 10*3/uL (ref 1.7–7.7)
Neutrophils Relative %: 38 %
Platelet Count: 87 10*3/uL — ABNORMAL LOW (ref 150–400)
RBC: 2.69 MIL/uL — AB (ref 4.22–5.81)
RDW: 14.7 % (ref 11.5–15.5)
WBC: 11 10*3/uL — AB (ref 4.0–10.5)
nRBC: 0 % (ref 0.0–0.2)

## 2018-10-11 LAB — CMP (CANCER CENTER ONLY)
ALT: 7 U/L (ref 0–44)
AST: 13 U/L — ABNORMAL LOW (ref 15–41)
Albumin: 4.1 g/dL (ref 3.5–5.0)
Alkaline Phosphatase: 95 U/L (ref 38–126)
Anion gap: 15 (ref 5–15)
BUN: 71 mg/dL — ABNORMAL HIGH (ref 8–23)
CALCIUM: 9.2 mg/dL (ref 8.9–10.3)
CO2: 19 mmol/L — AB (ref 22–32)
Chloride: 106 mmol/L (ref 98–111)
Creatinine: 6.09 mg/dL (ref 0.61–1.24)
GFR, Est AFR Am: 10 mL/min — ABNORMAL LOW (ref >60)
GFR, Estimated: 9 mL/min — ABNORMAL LOW (ref >60)
Glucose, Bld: 120 mg/dL — ABNORMAL HIGH (ref 70–99)
Potassium: 4 mmol/L (ref 3.5–5.1)
Sodium: 140 mmol/L (ref 135–145)
Total Bilirubin: 0.6 mg/dL (ref 0.3–1.2)
Total Protein: 6.5 g/dL (ref 6.5–8.1)

## 2018-10-11 LAB — RETICULOCYTES
Immature Retic Fract: 10.5 % (ref 2.3–15.9)
RBC.: 2.69 MIL/uL — ABNORMAL LOW (ref 4.22–5.81)
Retic Count, Absolute: 42.8 10*3/uL (ref 19.0–186.0)
Retic Ct Pct: 1.6 % (ref 0.4–3.1)

## 2018-10-11 MED ORDER — DARBEPOETIN ALFA 300 MCG/0.6ML IJ SOSY
PREFILLED_SYRINGE | INTRAMUSCULAR | Status: AC
  Filled 2018-10-11: qty 0.6

## 2018-10-11 NOTE — Progress Notes (Signed)
Hematology and Oncology Follow Up Visit  Nathan Richards [id number] 03-Jan-1946 73 y.o. 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 is here today with his interpretor for follow-up. He remains off of hospice and states that his PCP has referred him to both cardiology and nephrology for further work up.  His creatinine is 6.09.  He states that he has some mild fatigue at times.  No issue with recent infection. No fever, chills, n/v, cough, rash, dizziness, SOB, chest pain, palpitations, abdominal pain or changes in bowel or bladder habits.  No swelling, tenderness, numbness or tingling in his extremities.  He takes lasix daily to help reduce any fluid retention.  No lymphadenopathy noted on exam.  He has maintained a good appetite and is staying well hydrated. His weight is stable.   ECOG Performance Status: 1 - Symptomatic but completely ambulatory  Medications:  Allergies as of 10/11/2018      Reactions   Ace Inhibitors    REACTION: cough   Amlodipine Besylate    REACTION: Impotence   Beta Adrenergic Blockers    REACTION: Impotence   Hydrochlorothiazide Other (See Comments)   May have contributed to his hypercalcemia      Medication List       Accurate as of October 11, 2018  3:30 PM. Always use your most recent med list.        carvedilol 12.5 MG tablet Commonly known as:  COREG TOME UNA TABLETA POR V?A ORAL DOS VECES AL D?A   furosemide 20 MG tablet Commonly known as:  LASIX Take 20 mg by mouth daily.   hydrALAZINE 25 MG tablet Commonly known as:  APRESOLINE TOME UNA TABLETA POR V?A ORAL CADA OCHO HORAS   isosorbide mononitrate 30 MG 24 hr tablet Commonly known as:  IMDUR Take 30 mg by mouth daily.   isosorbide-hydrALAZINE 20-37.5 MG  tablet Commonly known as:  BIDIL Take 1 tablet by mouth 3 (three) times daily.   VITAMIN D-1000 MAX ST 25 MCG (1000 UT) tablet Generic drug:  Cholecalciferol Take 1,000 mg by mouth daily.       Allergies:  Allergies  Allergen Reactions  . Ace Inhibitors     REACTION: cough  . Amlodipine Besylate     REACTION: Impotence  . Beta Adrenergic Blockers     REACTION: Impotence  . Hydrochlorothiazide Other (See Comments)    May have contributed to his hypercalcemia    Past Medical History, Surgical history, Social history, and Family History were reviewed and updated.  Review of Systems: All other 10 point review of systems is negative.   Physical Exam:  weight is 120 lb (54.4 kg). His oral temperature is 97.8 F (36.6 C). His blood pressure is 185/107 (abnormal) and his pulse is 74. His respiration is 19 and oxygen saturation is 100%.   Wt Readings from Last 3 Encounters:  10/11/18 120 lb (54.4 kg)  08/30/18 115 lb (52.2 kg)  05/31/18 116 lb 4 oz (52.7 kg)    Ocular: Sclerae unicteric, pupils equal, round and reactive to light Ear-nose-throat: Oropharynx clear, dentition fair Lymphatic: No cervical, supraclavicular or axillary adenopathy Lungs no rales or rhonchi, good excursion bilaterally Heart regular rate and rhythm, no murmur appreciated Abd soft, nontender, positive bowel sounds,  no liver or spleen tip palpated on exam, no fluid wave  MSK no focal spinal tenderness, no joint edema Neuro: non-focal, well-oriented, appropriate affect Breasts: Deferred   Lab Results  Component Value Date   WBC 11.0 (H) 10/11/2018   HGB 8.4 (L) 10/11/2018   HCT 25.9 (L) 10/11/2018   MCV 96.3 10/11/2018   PLT 87 (L) 10/11/2018   Lab Results  Component Value Date   FERRITIN 274 08/30/2018   IRON 44 08/30/2018   TIBC 183 (L) 08/30/2018   UIBC 138 08/30/2018   IRONPCTSAT 24 08/30/2018   Lab Results  Component Value Date   RETICCTPCT 1.6 10/11/2018   RBC 2.69 (L) 10/11/2018    RBC 2.69 (L) 10/11/2018   Lab Results  Component Value Date   KPAFRELGTCHN 109.0 (H) 08/30/2018   LAMBDASER 145.2 (H) 08/30/2018   KAPLAMBRATIO 0.75 08/30/2018   Lab Results  Component Value Date   IGGSERUM 1,230 08/30/2018   IGA 190 08/30/2018   IGMSERUM 130 08/30/2018   Lab Results  Component Value Date   TOTALPROTELP 6.7 08/30/2018   ALBUMINELP 3.5 08/30/2018   A1GS 0.3 08/30/2018   A2GS 0.9 08/30/2018   BETS 0.7 08/30/2018   BETA2SER 4.8 12/03/2007   GAMS 1.3 08/30/2018   MSPIKE Not Observed 08/30/2018   SPEI Comment 08/30/2018     Chemistry      Component Value Date/Time   NA 140 10/11/2018 1341   NA 139 08/27/2017 0751   K 4.0 10/11/2018 1341   K 3.7 08/27/2017 0751   CL 106 10/11/2018 1341   CL 106 08/27/2017 0751   CO2 19 (L) 10/11/2018 1341   CO2 19 08/27/2017 0751   BUN 71 (H) 10/11/2018 1341   BUN 60 (H) 08/27/2017 0751   CREATININE 6.09 (HH) 10/11/2018 1341   CREATININE 3.4 (HH) 08/27/2017 0751      Component Value Date/Time   CALCIUM 9.2 10/11/2018 1341   CALCIUM 8.4 08/27/2017 0751   ALKPHOS 95 10/11/2018 1341   ALKPHOS 109 (H) 08/27/2017 0751   AST 13 (L) 10/11/2018 1341   ALT 7 10/11/2018 1341   ALT 15 08/27/2017 0751   BILITOT 0.6 10/11/2018 1341       Impression and Plan: Nathan Richards is a very pleasant 73 yo Hispanic gentleman with CLL, stave IV. He continues to do well but has some occasional fatigue.  His counts are improved and we will hold off on giving Aranesp at this time per Dr. Marin Olp.  At this time we will follow-up as needed. He will contact our office with any questions or concerns. We can certainly see him any time he needs [place].   Laverna Peace, NP 2/17/20203:30 PM

## 2018-10-12 ENCOUNTER — Other Ambulatory Visit: Payer: Medicare Other | Admitting: Internal Medicine

## 2018-10-12 ENCOUNTER — Telehealth: Payer: Self-pay | Admitting: Family

## 2018-10-12 DIAGNOSIS — Z515 Encounter for palliative care: Secondary | ICD-10-CM

## 2018-10-12 LAB — IRON AND TIBC
Iron: 51 ug/dL (ref 42–163)
Saturation Ratios: 22 % (ref 20–55)
TIBC: 233 ug/dL (ref 202–409)
UIBC: 182 ug/dL (ref 117–376)

## 2018-10-12 LAB — LACTATE DEHYDROGENASE: LDH: 194 U/L — ABNORMAL HIGH (ref 98–192)

## 2018-10-12 LAB — FERRITIN: Ferritin: 241 ng/mL (ref 24–336)

## 2018-10-12 NOTE — Telephone Encounter (Signed)
Return visit as needed per 2/17 los

## 2018-10-14 ENCOUNTER — Telehealth: Payer: Self-pay | Admitting: Family

## 2018-10-14 NOTE — Telephone Encounter (Signed)
No los 2/17

## 2018-10-14 NOTE — Progress Notes (Signed)
    [facility] Consult Note Telephone: [phone]  Fax: [phone]  PATIENT NAME: Nathan Richards DOB: 09/21/1945 MRN: [id number]  PRIMARY CARE PROVIDER:   Doreatha Lew, MD  REFERRING PROVIDER: Dr. Marin Olp  RESPONSIBLE PARTY:   Self    RECOMMENDATIONS and PLAN:  Palliative Care Encounter Z51.5  1.  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 Recommend 1-2 doses of Lasix $Remove'20mg'yStmQwy$ .  Limit use due to hx of CKD stage V.  Pending appt with Cardiologist and Nephrologist.  Diuretic use declined by PCP.  Encouraged office nurse to expedite appt with above specialist to prevent worsening of CHF exacerbation.  Pt would benefit from office visit with PCP if unable to see specialists this week.  2.  Edema:  Related to end stage CHF and CKD.  See above plans.  Compression socks and leg elevation.  Limit oral hydration, low sodium meals. Weigh daily and report findings to PCP/Cardiologist.  3.  Advanced care planning:  Addressed progression of chronic illnesses. Pt desires to continue elevated level of care for chronic illnesses but without dialysis. He would like to improve symptoms without hospitalization.  He plans on keeping pending appointments with specialist and PCP.  Plan on f/u with pt to readdress goals of care in 2 weeks.  I spent 60 minutes providing this consultation at pt apartment,  From 1000  to 1100. More than 50% of the time in this consultation was spent coordinating communication with patient and PCP nurse.   HISTORY OF PRESENT ILLNESS:  Follow-up with  Nathan Richards.  Pt reports new onset of shortness of breath at rest and upon ambulation.  He has difficulty sleeping due to [facility] and has to use extra pillows to elevate his head. Decreased appetite.     CODE STATUS: CPR without intubation  PPS: 50% HOSPICE ELIGIBILITY/DIAGNOSIS: YES/stage V CKD and end stage CHF/  declined at this time  PAST MEDICAL HISTORY:  Past Medical History:  Diagnosis Date  . Anemia 12/24/2017  . CHF (congestive heart failure) ([place])   . Chronic kidney disease   . CLL (chronic lymphocytic leukemia) ([place])   . Dyspnea   . History of kidney stones   . Hypertension   . Urolithiasis    cystoscopy in 2002     PHYSICAL EXAM:   General: Chronically ill appearing Cardiovascular: regular rate and rhythm Pulmonary: Increased respirations.  Fine crackles in bases Abdomen: rounded, soft, nontender, + bowel sounds Extremities: 2+ edema BLE to knee level Skin: pale in color Neurological: A&O x3, weakness  Nathan Lex, NP

## 2018-10-15 ENCOUNTER — Encounter: Payer: Self-pay | Admitting: Internal Medicine

## 2018-10-15 NOTE — Progress Notes (Signed)
    [facility] Consult Note Telephone: [phone]  Fax: [phone]  PATIENT NAME: Nathan Richards DOB: 01-Jan-1946 MRN: [id number]  PRIMARY CARE PROVIDER:   Doreatha Lew, MD  REFERRING PROVIDER: Dr. Marin Olp  RESPONSIBLE PARTY:   Self  I spent >50% of  time in pt's home from 1000-1100 providing consultation with communication with patient and phone conversation with nurse at Dr. Noah Charon office.  Return call from nurse who states that Dr. Elon Jester agrees with referral to Hospice for comfort care.     RECOMMENDATIONS and PLAN:  Palliative Care Encounter Z51.5  F2F VISIT I came to see face-to-face Nathan Richards to assess him for hospice eligibility.   This is a 73 year old male who continues to show decline of combined systolic and diastolic CHF, end stage CKD stage V with anemia of chronic disease and CLL during this recertification period as evidence by patient's report that he has increased shortness of breath at rest and upon exertion along with increased peripheral edema as well as edema of abdomen.  He has been having difficulty sleeping due to shortness of breath with attempts to lie flat.  Patient now requires diuretic therapy of Lasix $Remove'20mg'iYCdAMG$ /day with hesitance due to CKD without any other options for treatment of advanced CHF/CAD per report of cardiologist notes. His most recent echocardiogram from 2020 notes an EF of 20% and compared to 35-40% in August 2019 per medical record review.     His renal function has declined as well with current BUN/CREA/GFR values of 71-74/6.06.2/8-9.  Patient has consistently refused hemodialysis treatments.  PPS is 50%. Ambulation within home and to medical appointments.  Nutritional assessment:  He is able to feed himself and prepares very light meals however his nutritional intake has decreased with decreased appetite.  He has gained 4 pounds over the last 2 weeks. The most recent weight recorded as  of 10/05/18 was 120# with a height of 5'2" BMI of 22.  His average weight is 116.    Other areas of decline:  Patient has also declined any further treatments of CLL and is only to return to Oncologist prn.  Current HGB 8.4 with platelets of 87 as related to anemia of chronic disease in Feb 2020.  Physical exam   BP 150/80 P 76 R24 Sats 99% on room air General: Chronically ill and thin elderly [place] male sitting on sofa. In NAD Cardiovascular: Regular rate and rhythm Pulmonary: clear anterior lung fields.  Slight increased rate. No increased effort Abdomen: soft, nontender, + bowel sounds GU: No suprapubic tenderness Extremities: 2+ pitting edema to knees Skin: Pale in color, exposed skin is intact Neurological: A&O x 3.  Generalized weakness but steady gait  The current treatment plan determined by the patient is comfort care only.     Gonzella Lex, NP-C

## 2018-10-18 ENCOUNTER — Other Ambulatory Visit: Payer: Self-pay | Admitting: Family

## 2018-10-18 ENCOUNTER — Encounter: Payer: Self-pay | Admitting: Family

## 2019-02-03 ENCOUNTER — Other Ambulatory Visit: Payer: Self-pay | Admitting: Hematology

## 2019-02-03 DIAGNOSIS — Z20822 Contact with and (suspected) exposure to covid-19: Secondary | ICD-10-CM

## 2019-02-03 NOTE — Progress Notes (Signed)
Patient transferred to [facility] today, symptomatic / Dr. Tomasa Hosteller is the referring provider / Brandon Melnick the Hospice nurse will pick up testing kit on tomorrow and return it tomorrow as well. Her cell [phone]

## 2019-02-04 ENCOUNTER — Other Ambulatory Visit

## 2019-02-04 DIAGNOSIS — Z20822 Contact with and (suspected) exposure to covid-19: Secondary | ICD-10-CM

## 2019-02-06 LAB — NOVEL CORONAVIRUS, NAA: SARS-CoV-2, NAA: NOT DETECTED

## 2019-02-08 ENCOUNTER — Telehealth: Payer: Self-pay

## 2019-02-08 NOTE — Telephone Encounter (Signed)
Lanetta Inch, [employer] scientist of Nursing at [facility] called and asked for a faxed copy of the patient's covid results. I advised we can fax them to her.  Fax: [phone]

## 2019-02-11 IMAGING — US US BIOPSY
1 series · 13 of 18 positions shown · non-contrast
Comparison: none

CLINICAL DATA: Chronic kidney disease, proteinuria and hematuria.
Renal biopsy required for further workup.

[Series 1: us biopsy · 0.19mm/px · 13 of 18 slices shown]
[im 1/18]
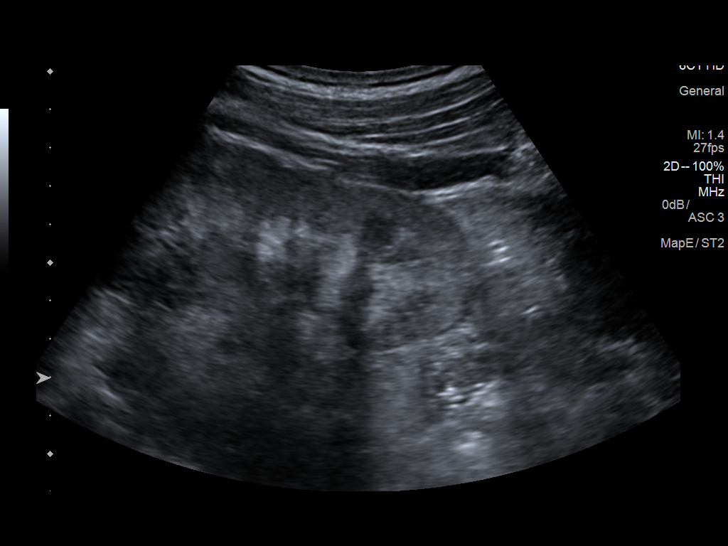
[im 3/18]
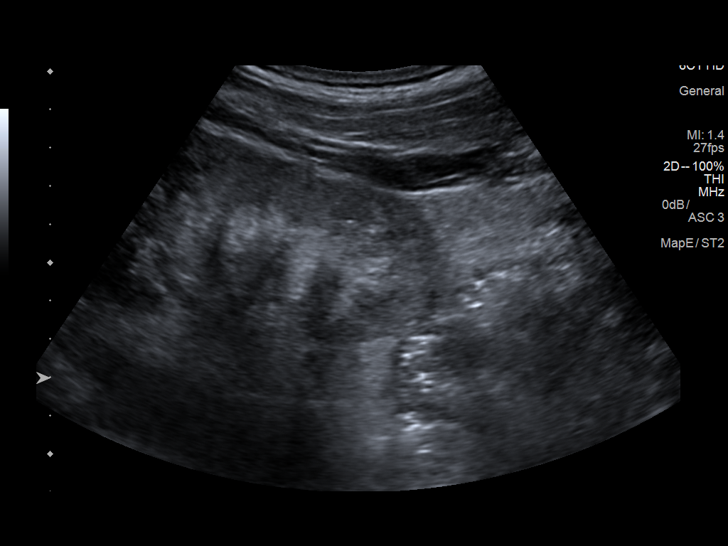
[im 4/18]
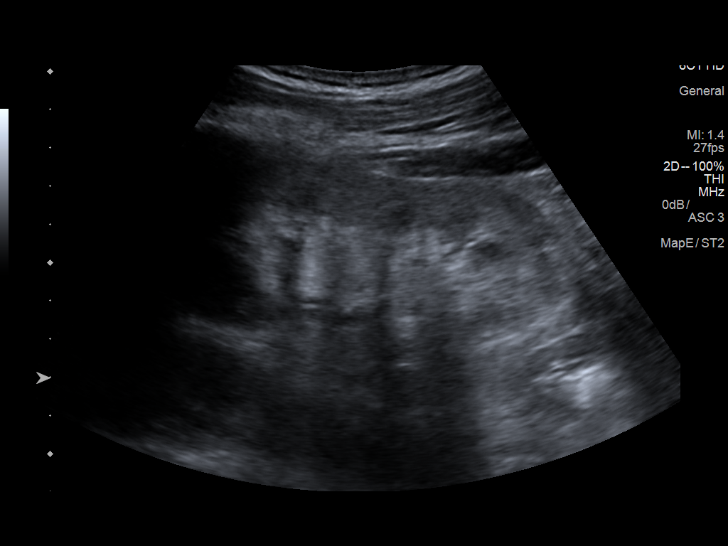
[im 5/18]
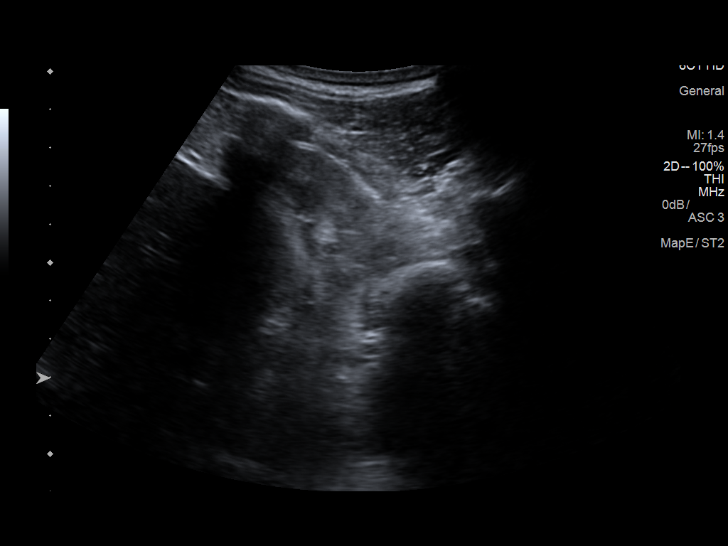
[im 7/18]
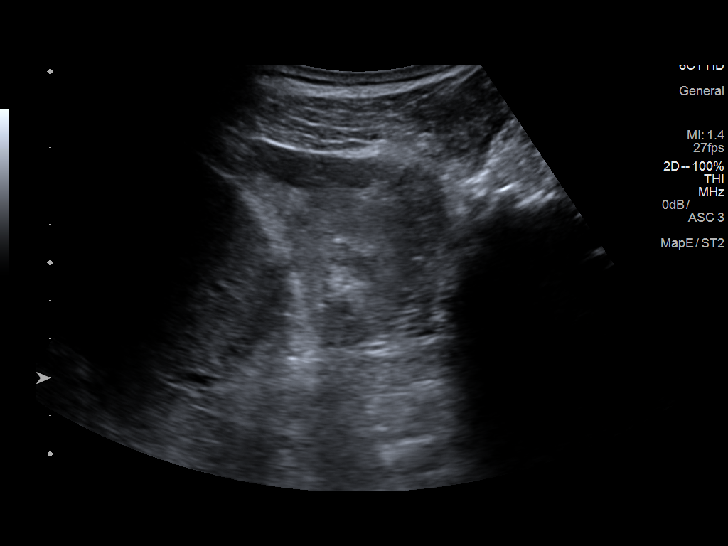
[im 8/18]
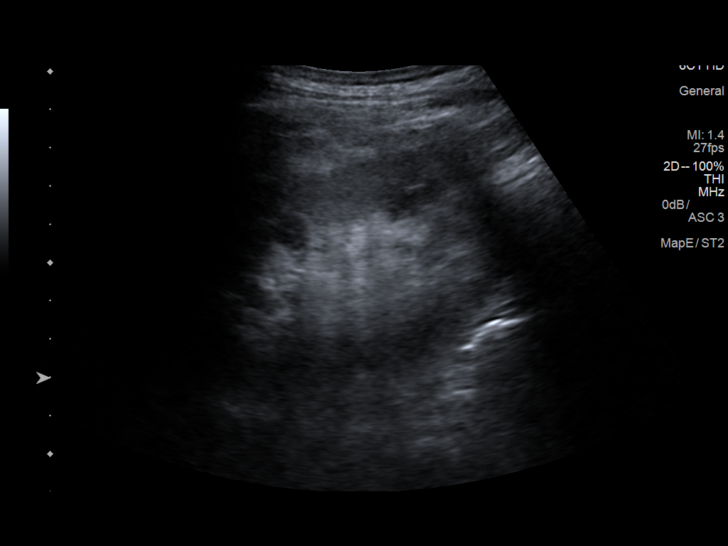
[im 10/18]
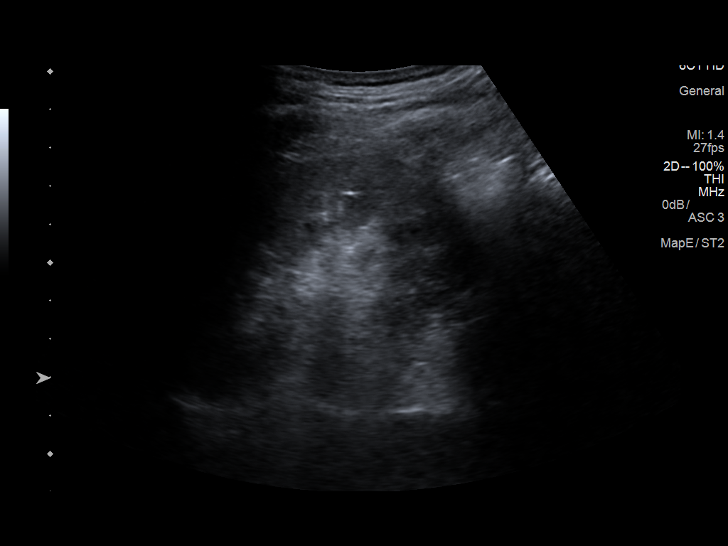
[im 11/18]
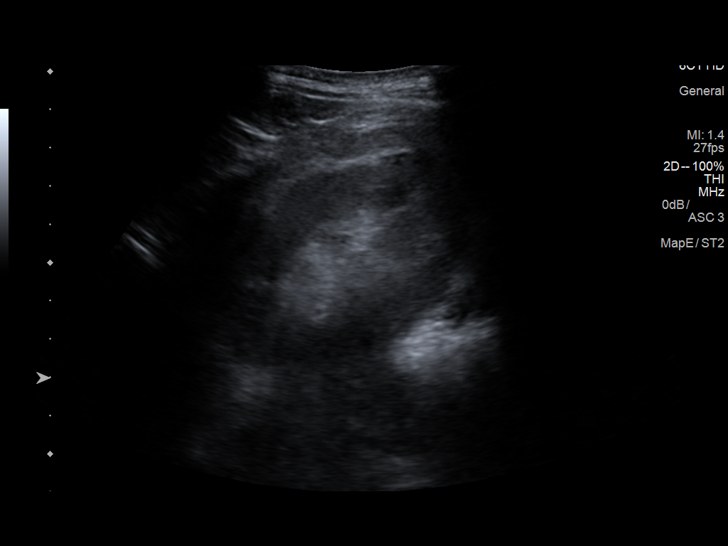
[im 12/18]
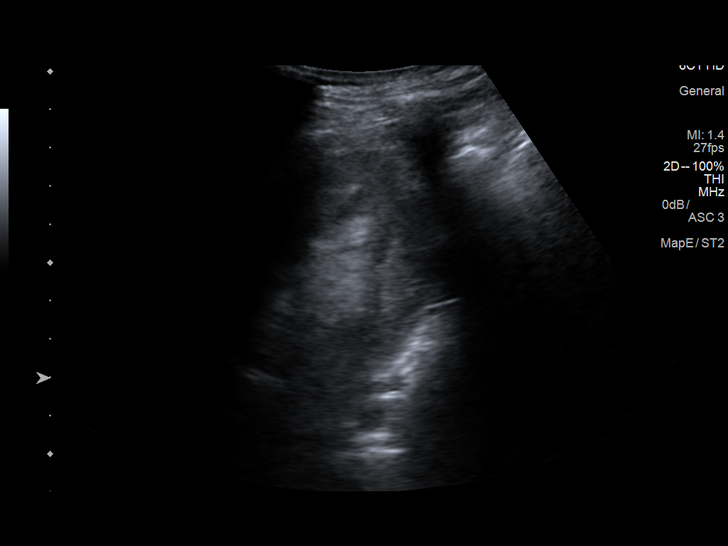
[im 14/18]
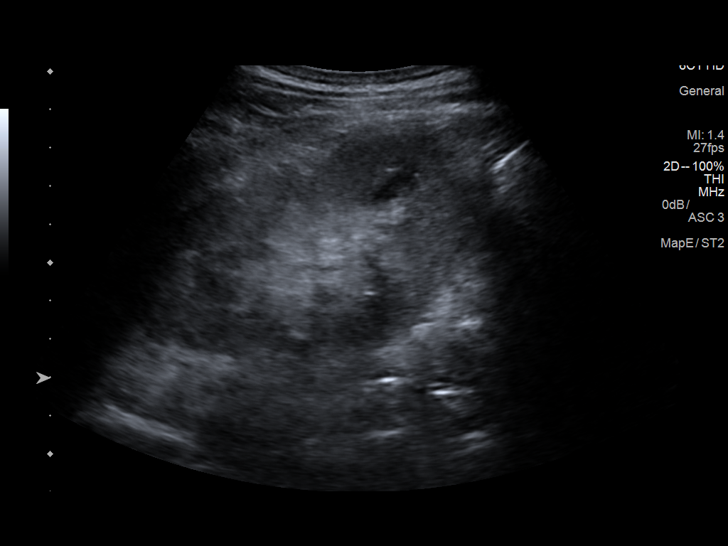
[im 15/18]
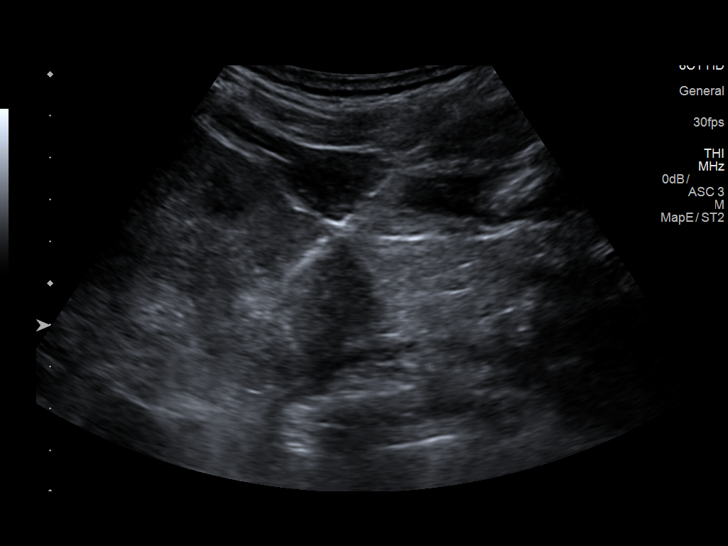
[im 16/18]
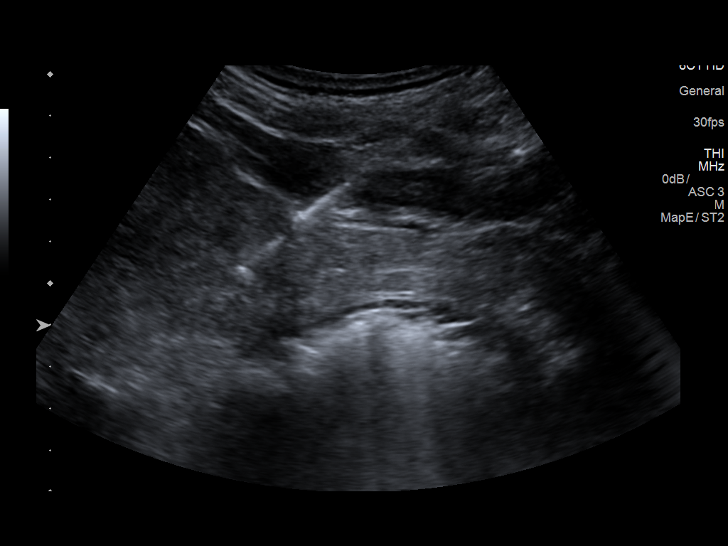
[im 18/18]
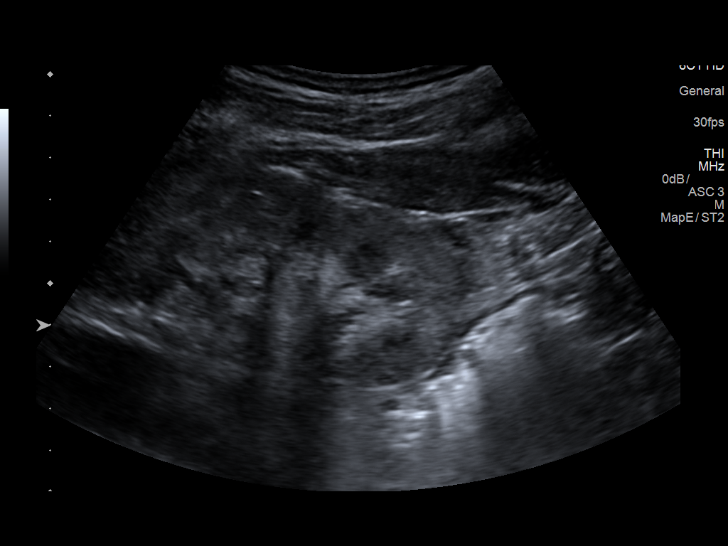

[13 of 18 positions shown; findings below may reference images not displayed]

EXAM:
ULTRASOUND GUIDED CORE BIOPSY OF KIDNEY

MEDICATIONS:
2.0 mg IV Versed; 50 mcg IV Fentanyl

Total Moderate Sedation Time: 11 minutes.

The patient's level of consciousness and physiologic status were
continuously monitored during the procedure by Radiology nursing.

PROCEDURE:
The procedure, risks, benefits, and alternatives were explained to
the patient. Questions regarding the procedure were encouraged and
answered. The patient understands and consents to the procedure. A
time out was performed prior to initiating the procedure.

Ultrasound was utilized to localize both kidneys. The left flank
region was prepped with chlorhexidine in a sterile fashion, and a
sterile drape was applied covering the operative field. A sterile
gown and sterile gloves were used for the procedure. Local
anesthesia was provided with 1% Lidocaine.

Under ultrasound guidance, a 16 gauge core biopsy device was
utilized in obtaining 2 separate samples at the level of lower pole
cortex of the left kidney. Core samples were submitted in saline.

COMPLICATIONS:
None.
FINDINGS: Both kidneys are visible by ultrasound and demonstrate increased
cortical echogenicity. The left kidney was better positioned for
sampling of the lower pole compared to the right. Solid tissue was
obtained from the lower pole cortex of the left kidney.
IMPRESSION: Ultrasound-guided core biopsy performed of left lower pole renal
cortex.

## 2019-02-23 DEATH — deceased

## 2019-06-17 IMAGING — DX DG CHEST 1V PORT
1 series · 1 of 1 positions shown · non-contrast
Comparison: 08/05/2006 chest radiograph

CLINICAL DATA: 70 y/o  M; shortness of breath.

EXAM:
PORTABLE CHEST 1 VIEW

[chest ap]
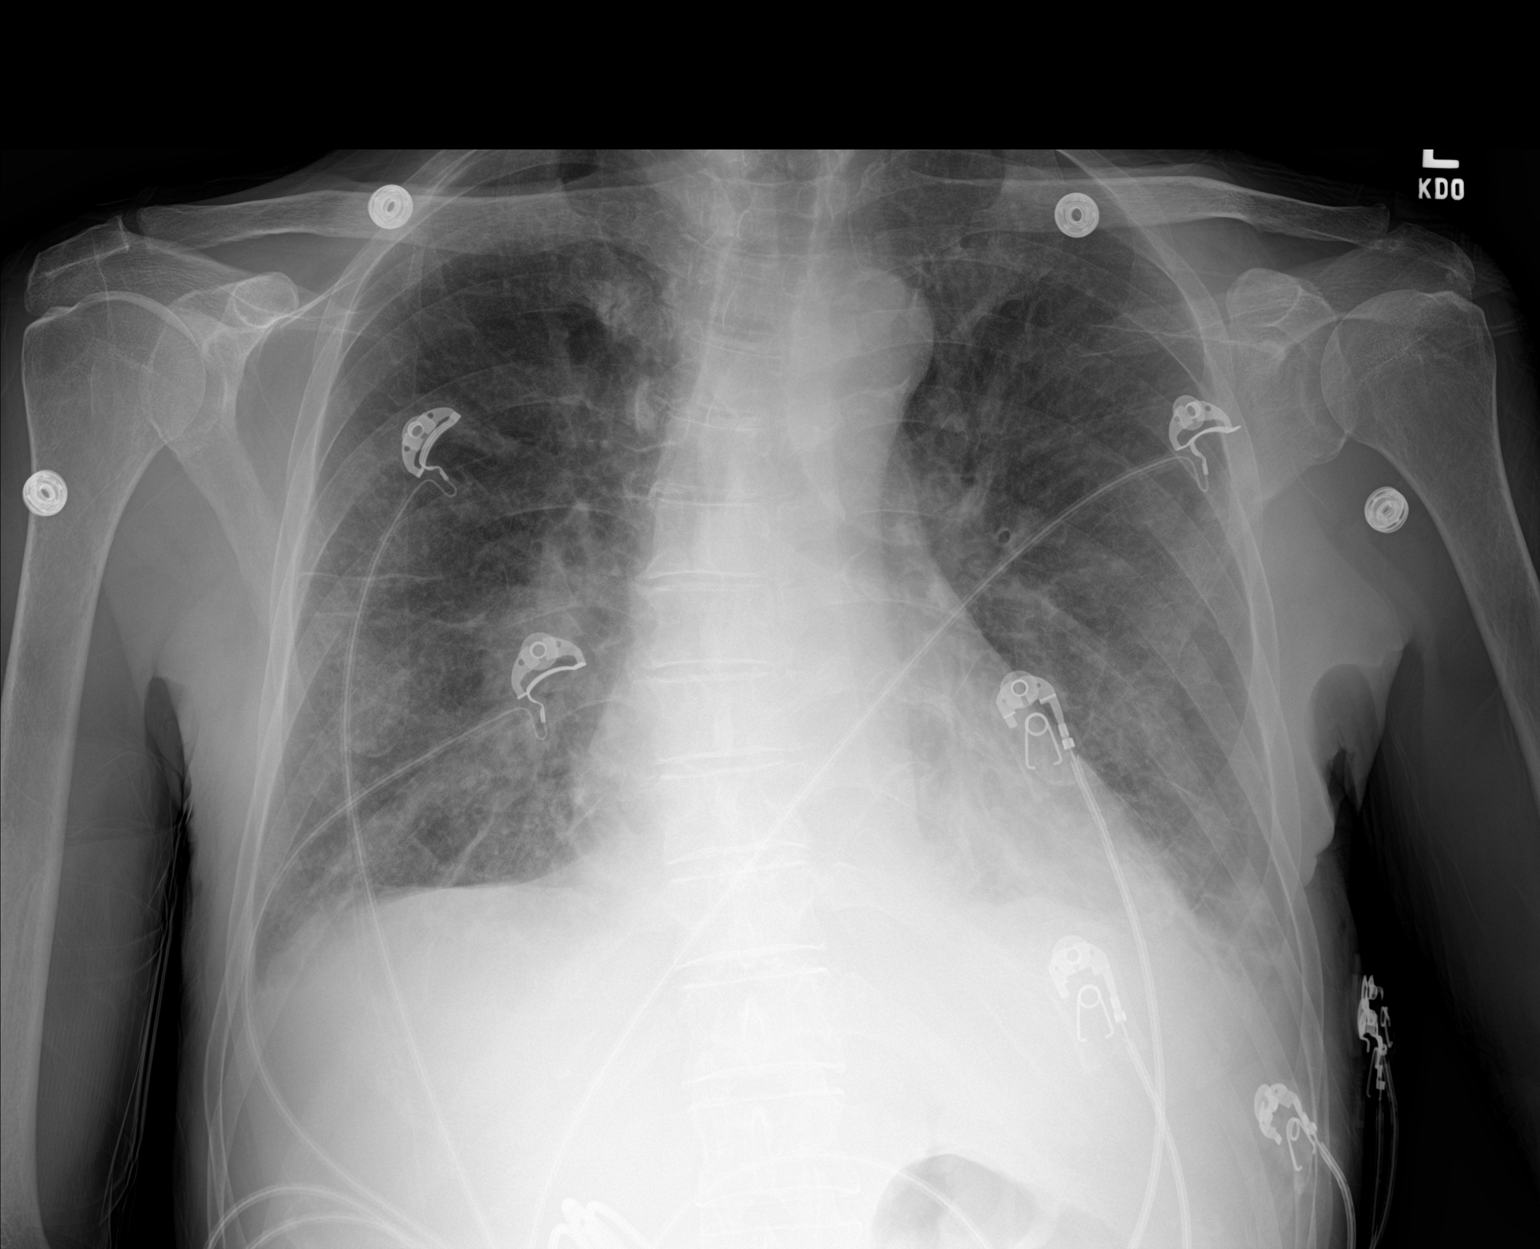

[1 of 1 positions shown; findings below may reference images not displayed]

FINDINGS: Stable normal cardiac silhouette given projection and technique.
Hazy opacification of lungs, reticular markings, and peripheral
linear opacities compatible alveolar pulmonary edema. Small
bilateral pleural effusions and atelectasis.
IMPRESSION: Alveolar pulmonary edema, small bilateral pleural effusions, minor
bibasilar atelectasis.

By: Shity Itti M.D.

## 2019-06-17 IMAGING — DX DG CHEST 2V
2 series · 2 of 2 positions shown · non-contrast
Comparison: None.

CLINICAL DATA: Short of breath

EXAM:
CHEST  2 VIEW

[chest pa]
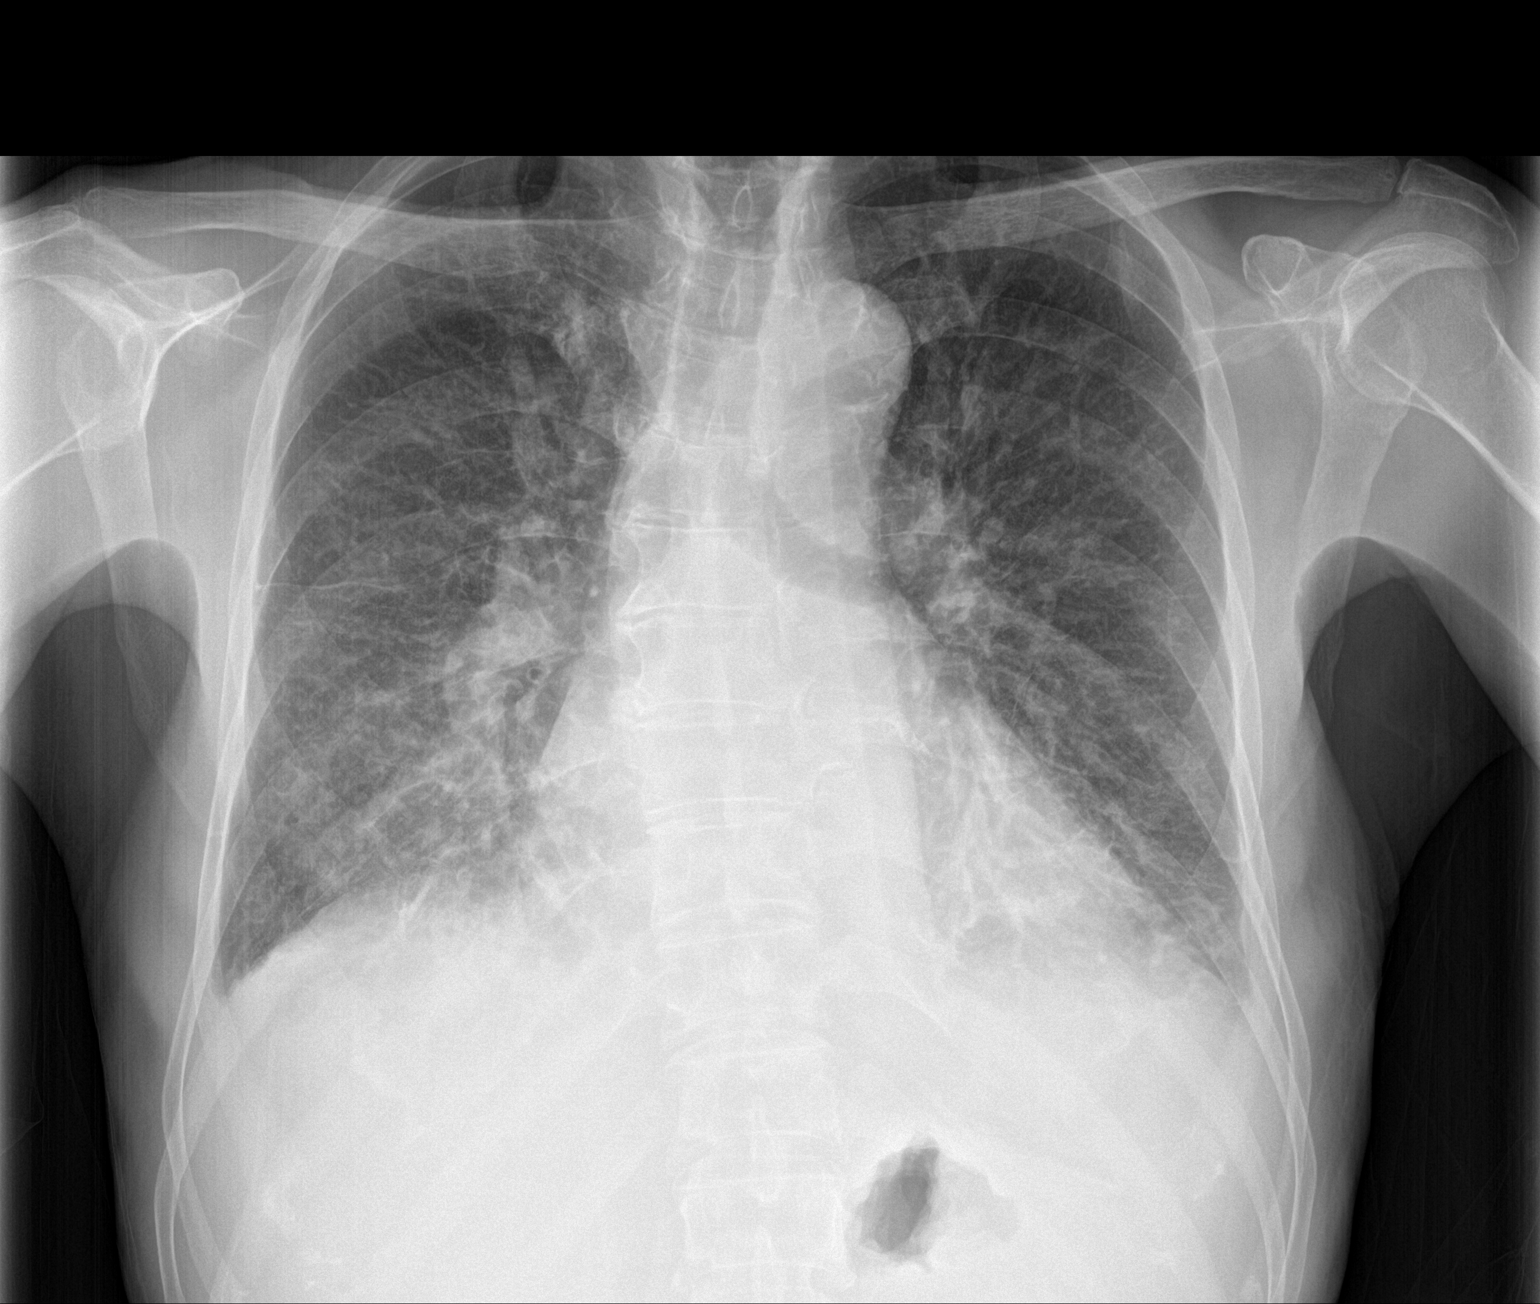

[chest lat]
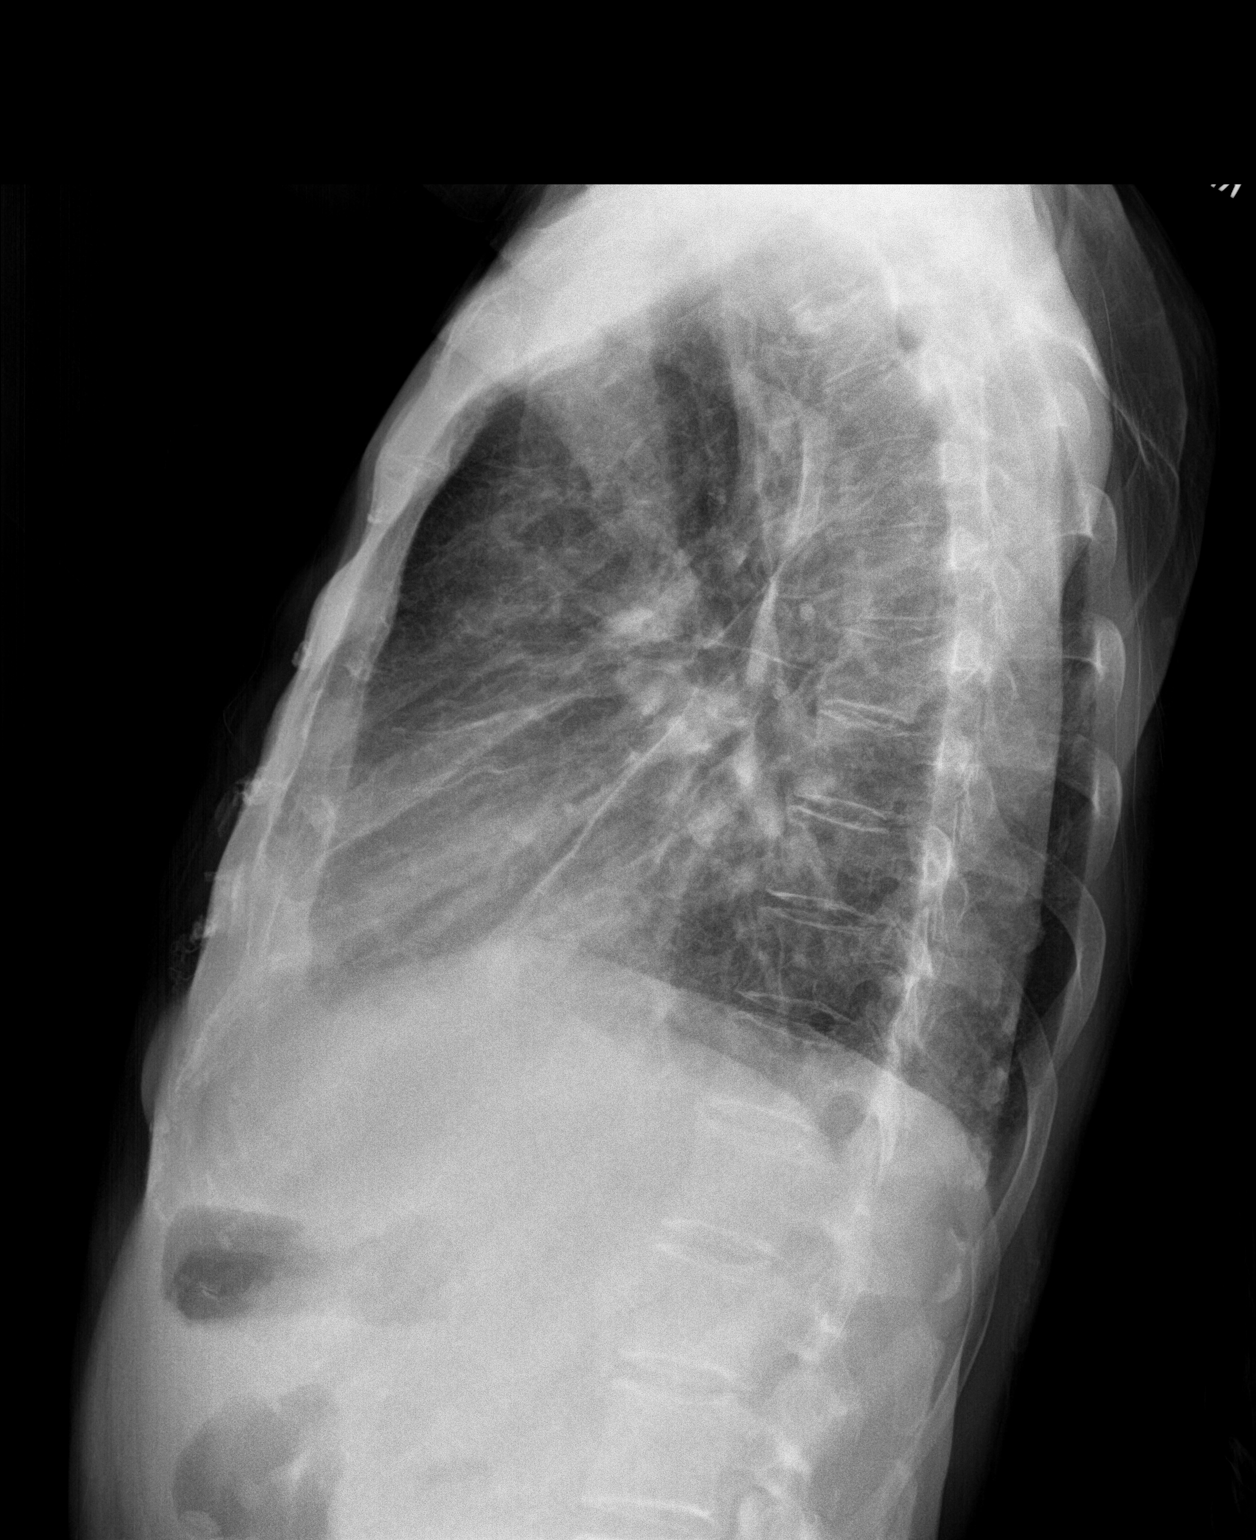

[2 of 2 positions shown; findings below may reference images not displayed]

FINDINGS: COPD with pulmonary hyperinflation. Cardiac enlargement with heart
failure. Interstitial edema and small bilateral effusions. Mild
bibasilar atelectasis.
IMPRESSION: COPD. Congestive heart failure with mild edema and bilateral
effusions.

## 2019-11-19 IMAGING — CR DG CHEST 2V
2 series · 2 of 2 positions shown · non-contrast
Comparison: Chest radiograph 07/22/2017

CLINICAL DATA: Chest pain and shortness of breath.  Cough.

EXAM:
CHEST - 2 VIEW

[chest lat]
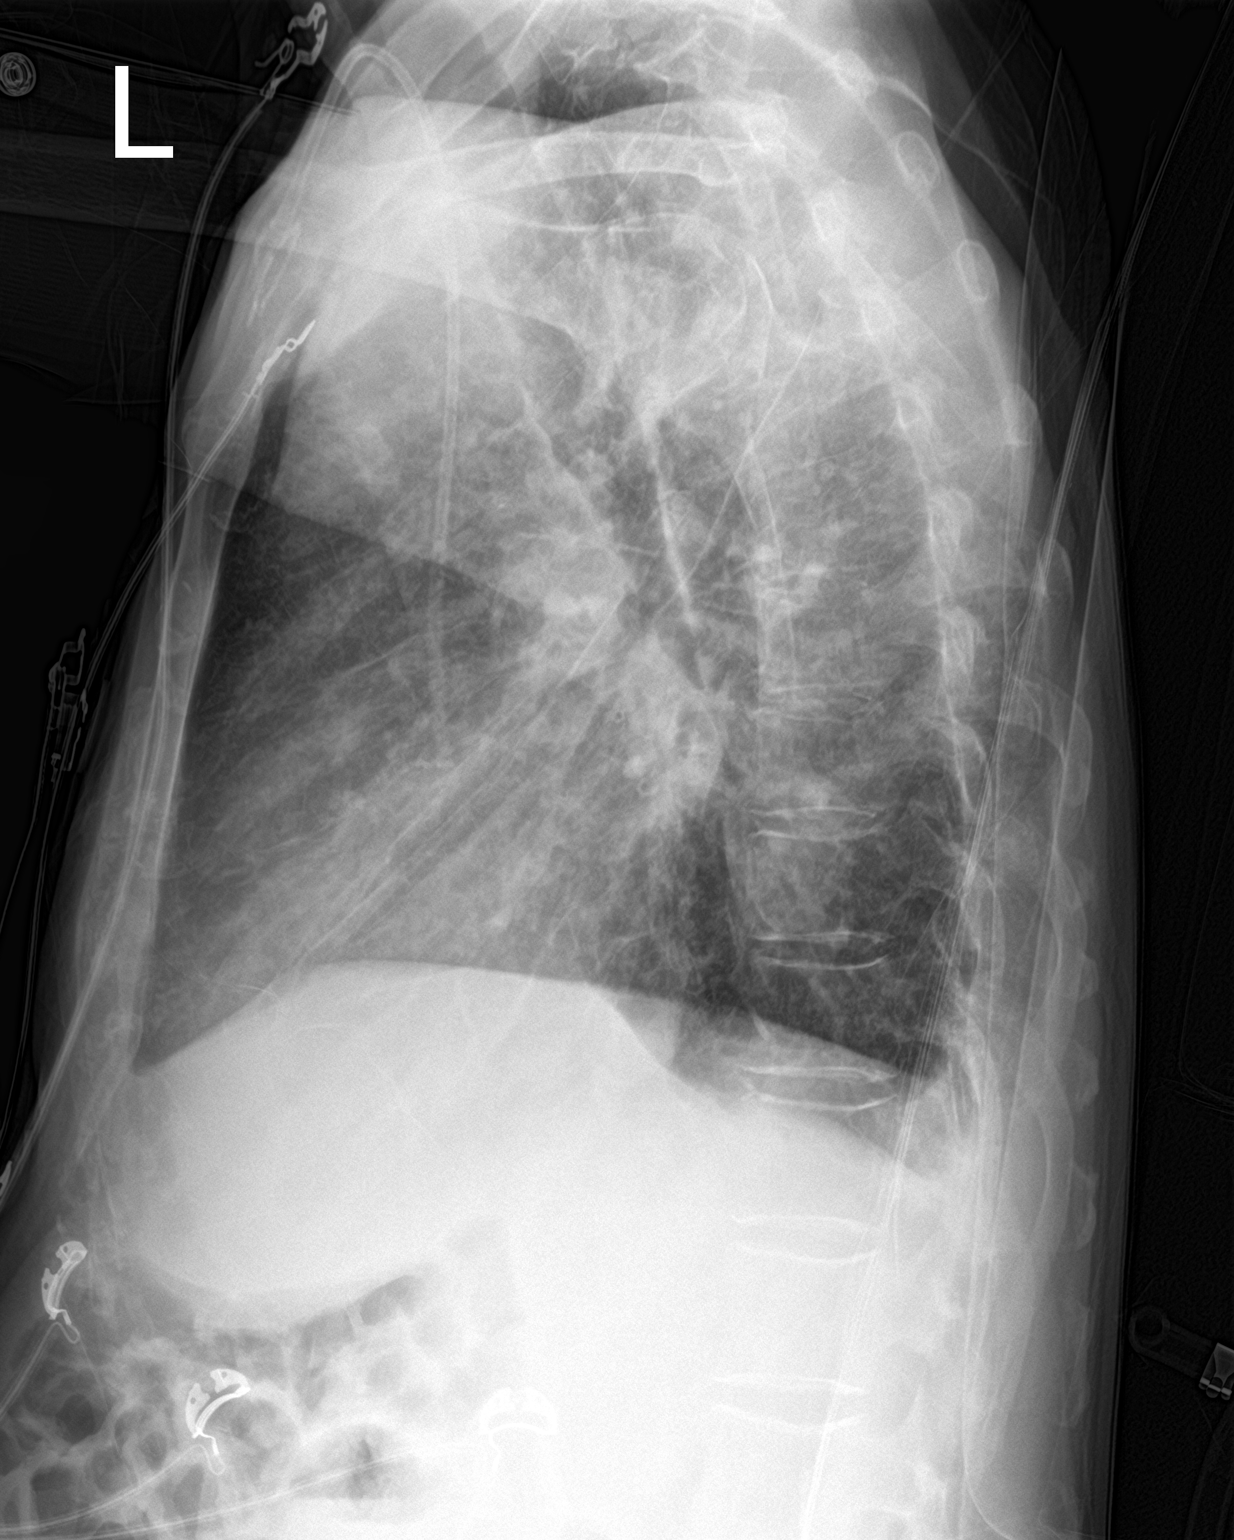

[chest ap]
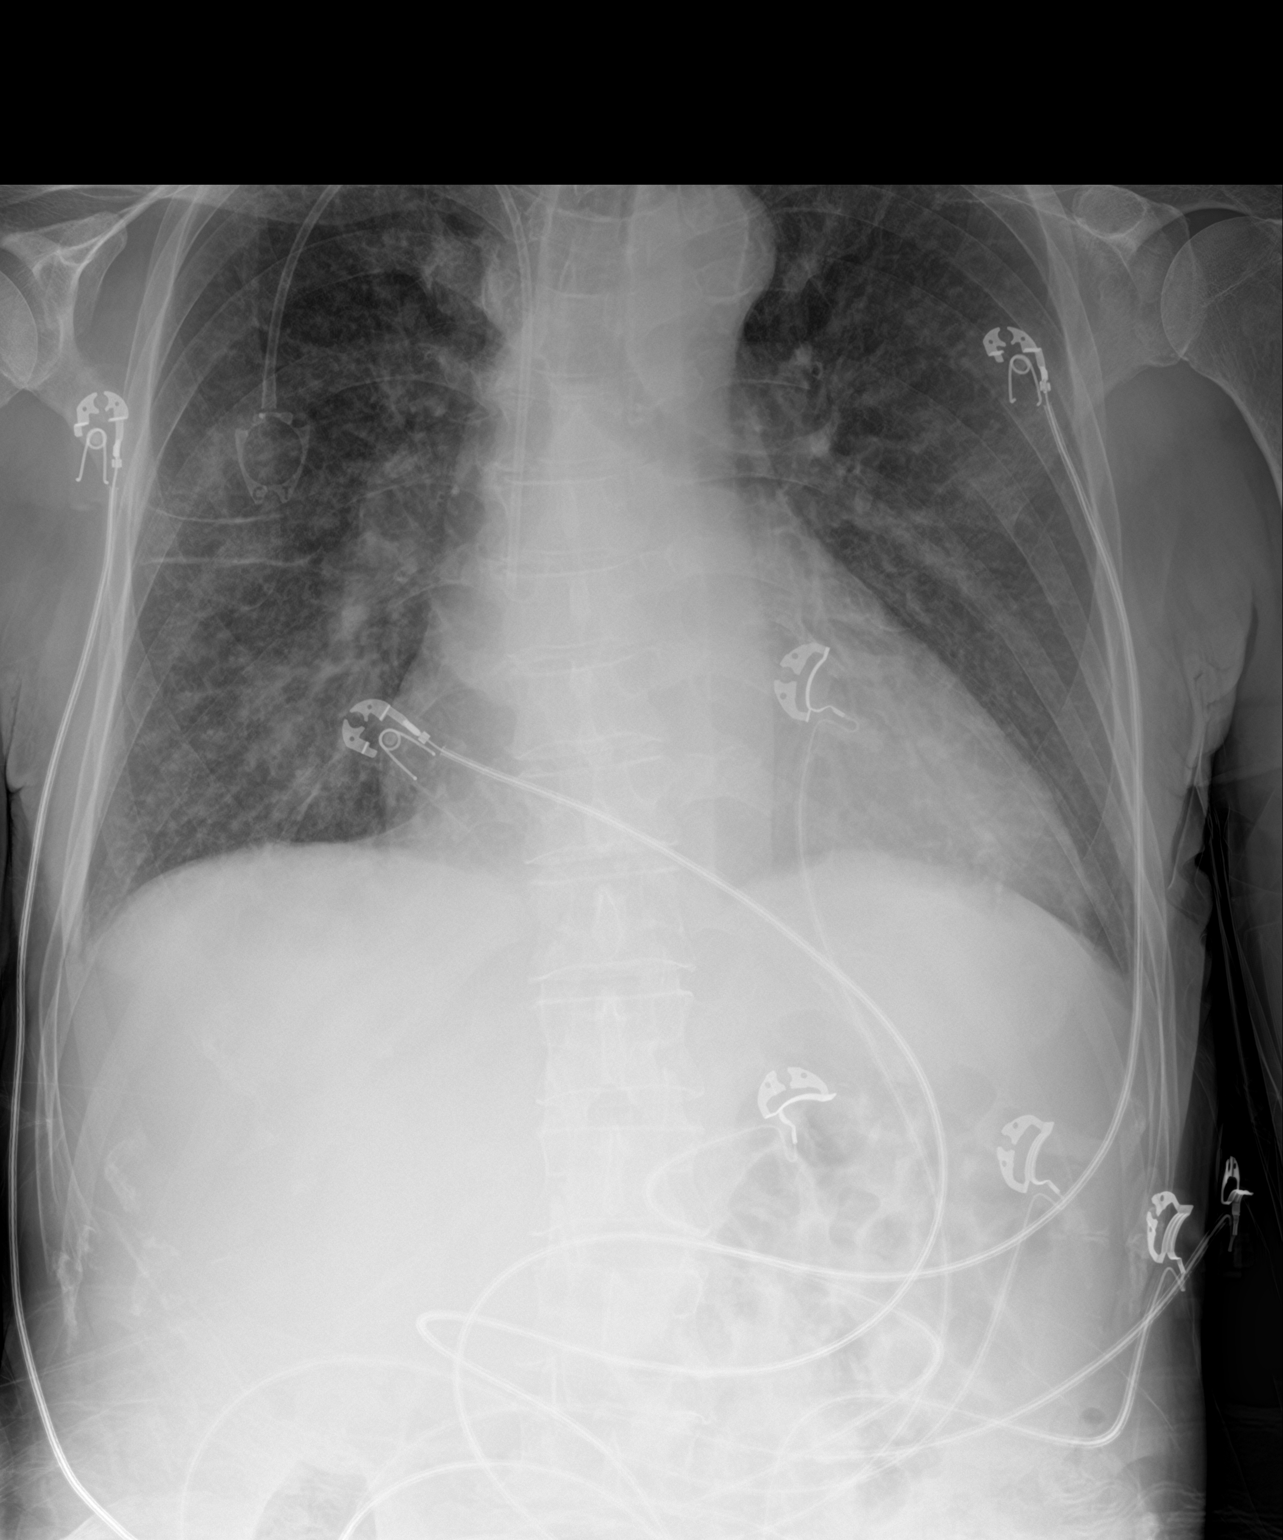

[2 of 2 positions shown; findings below may reference images not displayed]

FINDINGS: Right chest wall Port-A-Cath tip at the cavoatrial junction. Mild
cardiomegaly. Pulmonary vascular congestion without overt pulmonary
edema. No focal airspace consolidation, pleural effusion or
pneumothorax.
IMPRESSION: Cardiomegaly and pulmonary vascular congestion without overt edema.
